# Patient Record
Sex: Female | Born: 1965
Health system: Southern US, Community
[De-identification: ages and names within clinical notes are randomized; demographics above are authoritative.]

## PROBLEM LIST (undated history)

## (undated) DIAGNOSIS — L0390 Cellulitis, unspecified: Secondary | ICD-10-CM

## (undated) DIAGNOSIS — J302 Other seasonal allergic rhinitis: Secondary | ICD-10-CM

## (undated) DIAGNOSIS — L309 Dermatitis, unspecified: Secondary | ICD-10-CM

## (undated) DIAGNOSIS — C519 Malignant neoplasm of vulva, unspecified: Secondary | ICD-10-CM

## (undated) DIAGNOSIS — R0600 Dyspnea, unspecified: Secondary | ICD-10-CM

## (undated) DIAGNOSIS — I10 Essential (primary) hypertension: Secondary | ICD-10-CM

## (undated) DIAGNOSIS — H04129 Dry eye syndrome of unspecified lacrimal gland: Secondary | ICD-10-CM

## (undated) DIAGNOSIS — Z973 Presence of spectacles and contact lenses: Secondary | ICD-10-CM

## (undated) DIAGNOSIS — E119 Type 2 diabetes mellitus without complications: Secondary | ICD-10-CM

## (undated) DIAGNOSIS — D071 Carcinoma in situ of vulva: Secondary | ICD-10-CM

## (undated) DIAGNOSIS — E669 Obesity, unspecified: Secondary | ICD-10-CM

## (undated) DIAGNOSIS — H3530 Unspecified macular degeneration: Secondary | ICD-10-CM

## (undated) HISTORY — DX: Obesity, unspecified: E66.9

## (undated) HISTORY — DX: Malignant neoplasm of vulva, unspecified: C51.9

## (undated) HISTORY — DX: Dry eye syndrome of unspecified lacrimal gland: H04.129

## (undated) HISTORY — PX: NO PAST SURGERIES: SHX2092

## (undated) HISTORY — DX: Essential (primary) hypertension: I10

---

## 2007-07-31 ENCOUNTER — Ambulatory Visit: Payer: Self-pay | Admitting: Family Medicine

## 2007-07-31 DIAGNOSIS — I10 Essential (primary) hypertension: Secondary | ICD-10-CM | POA: Insufficient documentation

## 2007-08-21 ENCOUNTER — Ambulatory Visit: Payer: Self-pay | Admitting: Family Medicine

## 2007-08-21 LAB — CONVERTED CEMR LAB
AST: 21 units/L (ref 0–37)
Albumin: 4 g/dL (ref 3.5–5.2)
BUN: 14 mg/dL (ref 6–23)
Calcium: 9.3 mg/dL (ref 8.4–10.5)
Chloride: 100 meq/L (ref 96–112)
Glucose, Bld: 304 mg/dL — ABNORMAL HIGH (ref 70–99)
HDL: 47 mg/dL (ref >39)
Potassium: 4.3 meq/L (ref 3.5–5.3)
TSH: 2.905 microintl units/mL (ref 0.350–5.50)
Triglycerides: 111 mg/dL (ref <150)

## 2007-08-23 ENCOUNTER — Ambulatory Visit: Payer: Self-pay | Admitting: Family Medicine

## 2007-08-23 DIAGNOSIS — E118 Type 2 diabetes mellitus with unspecified complications: Secondary | ICD-10-CM | POA: Insufficient documentation

## 2007-08-28 ENCOUNTER — Encounter: Payer: Self-pay | Admitting: Family Medicine

## 2007-08-30 ENCOUNTER — Ambulatory Visit: Payer: Self-pay | Admitting: Family Medicine

## 2007-08-30 LAB — CONVERTED CEMR LAB: Blood Glucose, Fasting: 198 mg/dL

## 2007-08-31 ENCOUNTER — Ambulatory Visit: Payer: Self-pay | Admitting: Family Medicine

## 2007-09-05 ENCOUNTER — Encounter: Payer: Self-pay | Admitting: Family Medicine

## 2007-09-11 ENCOUNTER — Ambulatory Visit: Payer: Self-pay | Admitting: Family Medicine

## 2007-09-13 ENCOUNTER — Encounter: Admission: RE | Admit: 2007-09-13 | Discharge: 2007-09-13 | Payer: Self-pay | Admitting: Family Medicine

## 2007-09-15 ENCOUNTER — Telehealth: Payer: Self-pay | Admitting: Family Medicine

## 2007-09-19 ENCOUNTER — Encounter: Payer: Self-pay | Admitting: Family Medicine

## 2007-09-27 ENCOUNTER — Ambulatory Visit: Payer: Self-pay | Admitting: Family Medicine

## 2007-10-02 ENCOUNTER — Encounter: Payer: Self-pay | Admitting: Family Medicine

## 2007-10-02 ENCOUNTER — Other Ambulatory Visit: Admission: RE | Admit: 2007-10-02 | Discharge: 2007-10-02 | Payer: Self-pay | Admitting: Family Medicine

## 2007-10-02 ENCOUNTER — Ambulatory Visit: Payer: Self-pay | Admitting: Family Medicine

## 2007-10-02 ENCOUNTER — Encounter: Admission: RE | Admit: 2007-10-02 | Discharge: 2007-10-02 | Payer: Self-pay | Admitting: Family Medicine

## 2007-10-05 ENCOUNTER — Encounter: Payer: Self-pay | Admitting: Family Medicine

## 2007-10-05 LAB — CONVERTED CEMR LAB: Pap Smear: NORMAL

## 2007-10-19 ENCOUNTER — Encounter: Payer: Self-pay | Admitting: Family Medicine

## 2007-10-19 DIAGNOSIS — E11319 Type 2 diabetes mellitus with unspecified diabetic retinopathy without macular edema: Secondary | ICD-10-CM | POA: Insufficient documentation

## 2007-11-30 ENCOUNTER — Ambulatory Visit: Payer: Self-pay | Admitting: Family Medicine

## 2007-11-30 LAB — CONVERTED CEMR LAB: Hgb A1c MFr Bld: 6.3 %

## 2007-12-12 ENCOUNTER — Telehealth: Payer: Self-pay | Admitting: Family Medicine

## 2008-02-29 ENCOUNTER — Ambulatory Visit: Payer: Self-pay | Admitting: Family Medicine

## 2008-03-11 ENCOUNTER — Encounter: Payer: Self-pay | Admitting: Family Medicine

## 2008-05-31 ENCOUNTER — Ambulatory Visit: Payer: Self-pay | Admitting: Family Medicine

## 2008-08-30 ENCOUNTER — Ambulatory Visit: Payer: Self-pay | Admitting: Family Medicine

## 2008-08-30 DIAGNOSIS — R609 Edema, unspecified: Secondary | ICD-10-CM | POA: Insufficient documentation

## 2008-08-30 LAB — CONVERTED CEMR LAB

## 2008-09-02 DIAGNOSIS — E119 Type 2 diabetes mellitus without complications: Secondary | ICD-10-CM | POA: Insufficient documentation

## 2008-09-04 LAB — CONVERTED CEMR LAB
Albumin: 4.1 g/dL (ref 3.5–5.2)
CO2: 25 meq/L (ref 19–32)
Calcium: 9.6 mg/dL (ref 8.4–10.5)
Chloride: 103 meq/L (ref 96–112)
Cholesterol: 193 mg/dL (ref 0–200)
Glucose, Bld: 134 mg/dL — ABNORMAL HIGH (ref 70–99)
Sodium: 141 meq/L (ref 135–145)
Total Bilirubin: 0.4 mg/dL (ref 0.3–1.2)
Total Protein: 8 g/dL (ref 6.0–8.3)
Triglycerides: 80 mg/dL (ref <150)
VLDL: 16 mg/dL (ref 0–40)

## 2008-09-24 ENCOUNTER — Encounter: Payer: Self-pay | Admitting: Family Medicine

## 2008-12-02 ENCOUNTER — Telehealth: Payer: Self-pay | Admitting: Family Medicine

## 2008-12-04 ENCOUNTER — Other Ambulatory Visit: Admission: RE | Admit: 2008-12-04 | Discharge: 2008-12-04 | Payer: Self-pay | Admitting: Family Medicine

## 2008-12-04 ENCOUNTER — Ambulatory Visit: Payer: Self-pay | Admitting: Family Medicine

## 2008-12-04 ENCOUNTER — Encounter: Payer: Self-pay | Admitting: Family Medicine

## 2009-03-06 ENCOUNTER — Ambulatory Visit: Payer: Self-pay | Admitting: Family Medicine

## 2009-03-06 LAB — CONVERTED CEMR LAB: Hgb A1c MFr Bld: 7.3 %

## 2009-04-16 ENCOUNTER — Ambulatory Visit: Payer: Self-pay | Admitting: Family Medicine

## 2009-04-16 DIAGNOSIS — J069 Acute upper respiratory infection, unspecified: Secondary | ICD-10-CM | POA: Insufficient documentation

## 2009-05-12 ENCOUNTER — Encounter: Payer: Self-pay | Admitting: Family Medicine

## 2009-07-15 ENCOUNTER — Ambulatory Visit: Payer: Self-pay | Admitting: Family Medicine

## 2009-07-15 LAB — CONVERTED CEMR LAB: Hgb A1c MFr Bld: 9.2 %

## 2009-11-14 ENCOUNTER — Ambulatory Visit: Payer: Self-pay | Admitting: Family Medicine

## 2009-11-14 LAB — CONVERTED CEMR LAB
Creatinine,U: 10 mg/dL
Microalbumin U total vol: 10 mg/L

## 2009-11-15 ENCOUNTER — Encounter: Payer: Self-pay | Admitting: Family Medicine

## 2009-11-18 LAB — CONVERTED CEMR LAB
AST: 17 units/L (ref 0–37)
Alkaline Phosphatase: 99 units/L (ref 39–117)
BUN: 16 mg/dL (ref 6–23)
Creatinine, Ser: 0.8 mg/dL (ref 0.40–1.20)
HDL: 42 mg/dL (ref >39)
LDL Cholesterol: 70 mg/dL (ref 0–99)
Total CHOL/HDL Ratio: 3.1
Triglycerides: 92 mg/dL (ref <150)

## 2009-12-15 ENCOUNTER — Ambulatory Visit: Payer: Self-pay | Admitting: Family Medicine

## 2010-01-16 ENCOUNTER — Ambulatory Visit: Payer: Self-pay | Admitting: Family Medicine

## 2010-02-20 ENCOUNTER — Ambulatory Visit: Payer: Self-pay | Admitting: Family Medicine

## 2010-06-16 NOTE — Assessment & Plan Note (Signed)
Summary: f/u wt/ sugars   Vital Signs:  Patient profile:   45 year old female Height:      64.75 inches Weight:      324 pounds BMI:     54.53 O2 Sat:      98 % on Room air Pulse rate:   97 / minute BP sitting:   132 / 74  (left arm)  Vitals Entered By: Payton Spark CMA (December 15, 2009 8:48 AM)  O2 Flow:  Room air  CC: F/U weight   Primary Care Provider:  Seymour Bars DO  CC:  F/U weight.  History of Present Illness: 45 yo WF presents for f/u weight mgmt and DM.  Her sugars have improved with changing her sulfonylurea to two times a day.  She is running 130s AM fasting.  Her 2 hr post dinner readings are running in the 150 range.  Denies any lows.  Tolerating it well.  She is trying to be more active.  She has been doing well on the Phentermine.  She has lost 5 lbs in the past month and has found that she is snacking less.    Denies heart palpitations, insomnia or tremor from the Phentermine.  Has not called [facility] about bariatric classes yet.    Allergies: No Known Drug Allergies  Past History:  Past Medical History: obesity infertility Type II DM 4-09 with retinopathy HTN  Social History: Reviewed history from 07/31/2007 and no changes required. Homemaker.  Married to [place]. Looking into Fostering kids/ adopting. Used to work in [employer].  Moved from [place] in 2008. Quit smoking in 1988. Rare ETOH. Max weight 340, trying to lose.  No exercise.    Review of Systems      See HPI  Physical Exam  General:  obese WF in NAD Lungs:  Normal respiratory effort, chest expands symmetrically. Lungs are clear to auscultation, no crackles or wheezes. Heart:  Normal rate and regular rhythm. S1 and S2 normal without gallop, murmur, click, rub or other extra sounds. Skin:  color normal.   Psych:  good eye contact, not anxious appearing, and not depressed appearing.    Diabetes Management Exam:    Foot Exam (with socks and/or shoes not present):       Sensory-Pinprick/Light  touch:          Left medial foot (L-4): normal          Left dorsal foot (L-5): normal          Left lateral foot (S-1): normal          Right medial foot (L-4): normal          Right dorsal foot (L-5): normal          Right lateral foot (S-1): normal       Sensory-Monofilament:          Left foot: normal          Right foot: normal       Inspection:          Left foot: normal          Right foot: normal       Nails:          Left foot: normal          Right foot: normal   Impression & Recommendations:  Problem # 1:  DIABETES MELLITUS (ICD-250.00) Assessment Improved Home sugar readings have improved with change from once daily Amaryl to twice daily Glipizide.  Continue current meds.  F/U DM in 2 months.  Monofilament is normal today. Her updated medication list for this problem includes:    Micardis Hct 80-12.5 Mg Tabs (Telmisartan-hctz) .Marland Kitchen... 1 tab by mouth daily    Metformin Hcl 1000 Mg Tabs (Metformin hcl) .Marland Kitchen... 1 tab by mouth bid    Bayer Childrens Aspirin 81 Mg Chew (Aspirin) .Marland Kitchen... 1 tab by mouth daily    Glipizide 5 Mg Tabs (Glipizide) .Marland Kitchen... 1 tab by mouth two times a day with breakfast and dinner  Problem # 2:  MORBID OBESITY (ICD-278.01) Wt lost: 5 # in 1 month on phentermine.  Improvement in snacking and portion control.  Will continue to work on diet, exercise and she has the numbner for [facility] bariatric surgery info classes but is not ready to call.    RTC in 1 month for wt/ BP check on month 2 of phentermine.    Complete Medication List: 1)  Micardis Hct 80-12.5 Mg Tabs (Telmisartan-hctz) .Marland Kitchen.. 1 tab by mouth daily 2)  Metformin Hcl 1000 Mg Tabs (Metformin hcl) .Marland Kitchen.. 1 tab by mouth bid 3)  Bayer Childrens Aspirin 81 Mg Chew (Aspirin) .Marland Kitchen.. 1 tab by mouth daily 4)  1st Choice Lancets Super Thin Misc (Lancets) .... Use two times a day as directed 5)  One Touch Ultra Strips 100's  .... Use as directed dx:250.00 6)  Metronidazole 0.75 % Lotn (Metronidazole) .... Apply to  roseacea bid 7)  Lipitor 20 Mg Tabs (Atorvastatin calcium) .Marland Kitchen.. 1 tab by mouth qhs 8)  Glipizide 5 Mg Tabs (Glipizide) .Marland Kitchen.. 1 tab by mouth two times a day with breakfast and dinner 9)  Phentermine Hcl 37.5 Mg Tabs (Phentermine hcl) .... 1/2 tab by mouth qam, 30 min before breakfast and 1/2 tab by mouth at 2:00 pm  Patient Instructions: 1)  Stay on Phentermine. 2)  Keep working on healthy diet with portion control and avoid snacking. 3)  Increase physical activity. 4)  Return for a nurse visit in 1 month - WT/ BP check. Prescriptions: PHENTERMINE HCL 37.5 MG TABS (PHENTERMINE HCL) 1/2 tab by mouth qAM, 30 min before breakfast and 1/2 tab by mouth at 2:00 pm  #30 x 0   Entered and Authorized by:   Seymour Bars DO   Signed by:   Seymour Bars DO on 12/15/2009   Method used:   Printed then faxed to ...       [facility]* (retail)       [address] [place], [place]  [address]       Ph: [phone]       Fax: [phone]   RxID:   [id number] METFORMIN HCL 1000 MG TABS (METFORMIN HCL) 1 tab by mouth bid  #30 x 0   Entered by:   Payton Spark CMA   Authorized by:   Seymour Bars DO   Signed by:   Payton Spark CMA on 12/15/2009   Method used:   Electronically to        [facility]* (retail)       [address] [place], [place]  [address]       Ph: [phone]       Fax: [phone]   RxID:   [id number]

## 2010-06-16 NOTE — Assessment & Plan Note (Signed)
Summary: f/u DM   Vital Signs:  Patient profile:   45 year old female Height:      64.75 inches Weight:      329 pounds BMI:     55.37 Pulse rate:   79 / minute BP sitting:   122 / 69  (left arm) Cuff size:   large  Vitals Entered By: Kathlene November (November 14, 2009 7:59 AM) CC: followup diabetes Is Patient Diabetic? Yes   Primary Care Provider:  Seymour Bars DO  CC:  followup diabetes.  History of Present Illness: 45 yo WF presents for f/u T2DM.  3 mos ago, her A1C was 9.2--> 8.3 today.  Last visit, we added Glimeperide to her Metformin.  Her AM fastings have been running close to 200.  Her 2 hr PPs are in the 150s on avg.  Her eye exam is UTD.  She has retinopathy.  She has gained 9 more lbs.  She has poor dietary adherence.  Not yet exercising.     Current Medications (verified): 1)  Micardis Hct 80-12.5 Mg  Tabs (Telmisartan-Hctz) .Marland Kitchen.. 1 Tab By Mouth Daily 2)  Metformin Hcl 1000 Mg Tabs (Metformin Hcl) .Marland Kitchen.. 1 Tab By Mouth Bid 3)  Bayer Childrens Aspirin 81 Mg  Chew (Aspirin) .Marland Kitchen.. 1 Tab By Mouth Daily 4)  1st Choice Lancets Super Thin   Misc (Lancets) .... Use Two Times A Day As Directed 5)  One Touch Ultra Strips 100's .... Use As Directed Dx:250.00 6)  Metronidazole 0.75 % Lotn (Metronidazole) .... Apply To Roseacea Bid 7)  Lipitor 20 Mg Tabs (Atorvastatin Calcium) .Marland Kitchen.. 1 Tab By Mouth Qhs  Allergies (verified): No Known Drug Allergies  Comments:  Nurse/Medical Assistant: The patient's medications and allergies were reviewed with the patient and were updated in the Medication and Allergy Lists. Kathlene November (November 14, 2009 8:00 AM)  Past History:  Past Medical History: Reviewed history from 08/30/2007 and no changes required. obesity infertility Type II DM 4-09 HTN  Family History: Reviewed history from 07/31/2007 and no changes required. father died of melanoma at 45 mother alive, DM sister DM, HTN 2 older brothers healthy  Social History: Reviewed history  from 07/31/2007 and no changes required. Homemaker.  Married to [place]. Looking into Fostering kids/ adopting. Used to work in [employer].  Moved from [place] in 2008. Quit smoking in 1988. Rare ETOH. Max weight 340, trying to lose.  No exercise.    Review of Systems      See HPI  Physical Exam  General:  morbidly obese WF in NAD Head:  normocephalic and atraumatic.   Eyes:  pupils equal, pupils round, and pupils reactive to light.  wears glasses Mouth:  pharynx pink and moist.   Neck:  no masses.   Lungs:  Normal respiratory effort, chest expands symmetrically. Lungs are clear to auscultation, no crackles or wheezes. Heart:  Normal rate and regular rhythm. S1 and S2 normal without gallop, murmur, click, rub or other extra sounds. Extremities:  no LE edema Skin:  color normal.   Psych:  good eye contact, not anxious appearing, and not depressed appearing.     Impression & Recommendations:  Problem # 1:  DIABETES MELLITUS (ICD-250.00) A1C 9.2--> 8.3, better but still not at goal with high AM fastings -- due mostly to poor diet and maybe from the glimeperide wearing off overnight.  Will change her to twice a day glipizide and keep metformin on board.  F/U for DM in 3 mos.  Umicro normal today.  Monofilament at next visit with flu shot.  Has complication of retinopathy. The following medications were removed from the medication list:    Glimepiride 2 Mg Tabs (Glimepiride) .Marland Kitchen... 1 tab by mouth once daily with breakfast Her updated medication list for this problem includes:    Micardis Hct 80-12.5 Mg Tabs (Telmisartan-hctz) .Marland Kitchen... 1 tab by mouth daily    Metformin Hcl 1000 Mg Tabs (Metformin hcl) .Marland Kitchen... 1 tab by mouth bid    Bayer Childrens Aspirin 81 Mg Chew (Aspirin) .Marland Kitchen... 1 tab by mouth daily    Glipizide 5 Mg Tabs (Glipizide) .Marland Kitchen... 1 tab by mouth two times a day with breakfast and dinner  Orders: Fingerstick ([address]) Creatinine  ([address]) Hemoglobin A1C ([address]) Urine Microalbumin  (9[address]78) T-Comprehensive Metabolic Panel ([address]-13086)  Labs Reviewed: Creat: 0.71 (08/30/2008)   Microalbumin: 10 (11/14/2009)  Last Eye Exam: done by Dr Clearance Coots.  + for retinopathy (10/04/2007) Reviewed HgBA1c results: 8.3 (11/14/2009)  9.2 (07/15/2009)  Problem # 2:  MORBID OBESITY (ICD-278.01) BMI 55 c/w class III + obesity, gained another 9 lbs. Poor dietary compliance and little exercise.  She has a hard time controlling her snacking mostly.  Will try her on phentermine for the next month to see if this helps w/ wt reduction.  Her elevated sugars are a direct reflection of her poor diet and lack of exercise.  I've also given her the # to attend the bariatric surgery info class at [facility].  Problem # 3:  DYSLIPIDEMIA (ICD-272.4) Update labs.  Will see if Lipitor has her LDL at <100. Her updated medication list for this problem includes:    Lipitor 20 Mg Tabs (Atorvastatin calcium) .Marland Kitchen... 1 tab by mouth qhs  Orders: T-Lipid Profile [phone])  Labs Reviewed: SGOT: 14 (08/30/2008)   SGPT: 17 (08/30/2008)  Prior 10 Yr Risk Heart Disease: 6 % (05/31/2008)   HDL:47 (08/30/2008), 47 (08/21/2007)  LDL:130 (08/30/2008), 153 (08/21/2007)  Chol:193 (08/30/2008), 222 (08/21/2007)  Trig:80 (08/30/2008), 111 (08/21/2007)  Problem # 4:  ESSENTIAL HYPERTENSION, BENIGN (ICD-401.1) BP at goal and labs are due today. Her updated medication list for this problem includes:    Micardis Hct 80-12.5 Mg Tabs (Telmisartan-hctz) .Marland Kitchen... 1 tab by mouth daily  BP today: 122/69 Prior BP: 128/73 (07/15/2009)  Prior 10 Yr Risk Heart Disease: 6 % (05/31/2008)  Labs Reviewed: K+: 4.2 (08/30/2008) Creat: : 0.71 (08/30/2008)   Chol: 193 (08/30/2008)   HDL: 47 (08/30/2008)   LDL: 130 (08/30/2008)   TG: 80 (08/30/2008)  Complete Medication List: 1)  Micardis Hct 80-12.5 Mg Tabs (Telmisartan-hctz) .Marland Kitchen.. 1 tab by mouth daily 2)  Metformin Hcl 1000 Mg Tabs (Metformin hcl) .Marland Kitchen.. 1 tab by mouth bid 3)  Bayer  Childrens Aspirin 81 Mg Chew (Aspirin) .Marland Kitchen.. 1 tab by mouth daily 4)  1st Choice Lancets Super Thin Misc (Lancets) .... Use two times a day as directed 5)  One Touch Ultra Strips 100's  .... Use as directed dx:250.00 6)  Metronidazole 0.75 % Lotn (Metronidazole) .... Apply to roseacea bid 7)  Lipitor 20 Mg Tabs (Atorvastatin calcium) .Marland Kitchen.. 1 tab by mouth qhs 8)  Glipizide 5 Mg Tabs (Glipizide) .Marland Kitchen.. 1 tab by mouth two times a day with breakfast and dinner 9)  Phentermine Hcl 37.5 Mg Tabs (Phentermine hcl) .... 1/2 tab by mouth qam, 30 min before breakfast and 1/2 tab by mouth at 2:00 pm  Patient Instructions: 1)  Update fasting labs today. 2)  Will call you w/ results on Tuesday.  3)  Change glimeperide to glipizide -- take with breakfast and dinner. 4)  Start Phentermine (appetite suppressant ) 1/2 tab in the AM, 30 min before breakfast and 1/2 tab between 2-3 pm. 5)  Return for f/u in 1 month. Prescriptions: PHENTERMINE HCL 37.5 MG TABS (PHENTERMINE HCL) 1/2 tab by mouth qAM, 30 min before breakfast and 1/2 tab by mouth at 2:00 pm  #30 x 0   Entered and Authorized by:   Seymour Bars DO   Signed by:   Seymour Bars DO on 11/14/2009   Method used:   Print then Give to Patient   RxID:   [id number] GLIPIZIDE 5 MG TABS (GLIPIZIDE) 1 tab by mouth two times a day with breakfast and dinner  #60 x 3   Entered and Authorized by:   Seymour Bars DO   Signed by:   Seymour Bars DO on 11/14/2009   Method used:   Electronically to        [facility]* (retail)       [address] [place], [place]  [address]       Ph: [phone]       Fax: [phone]   RxID:   [id number]   Laboratory Results   Urine Tests  Date/Time Received: 11/14/2009 Date/Time Reported: 11/14/2009  Microalbumin (urine): 10 mg/L Creatinine: 10mg /dL  A:C Ratio 30mg /g  Blood Tests   Date/Time Received: 11/14/2009 Date/Time Reported: 11/14/2009  HGBA1C: 8.3%   (Normal Range: Non-Diabetic - 3-6%   Control  Diabetic - 6-8%)

## 2010-06-16 NOTE — Letter (Signed)
Summary: [facility]   Imported By: Lanelle Bal 05/22/2009 11:41:23  _____________________________________________________________________  External Attachment:    Type:   Image     Comment:   External Document

## 2010-06-16 NOTE — Assessment & Plan Note (Signed)
Summary: WT/ BP check  Nurse Visit   Vital Signs:  Patient profile:   45 year old female Height:      64.75 inches Weight:      318 pounds BMI:     53.52 O2 Sat:      96 % on Room air Pulse rate:   88 / minute BP sitting:   121 / 71  (left arm) Cuff size:   large  Vitals Entered By: Payton Spark CMA (January 16, 2010 4:14 PM)  O2 Flow:  Room air  Impression & Recommendations:  Problem # 1:  MORBID OBESITY (ICD-278.01) 6 lbs lost in 1 month from new start Phentermine.  BP at goal.  Continue Phentermine and f/u with me in 1 month.  Complete Medication List: 1)  Micardis Hct 80-12.5 Mg Tabs (Telmisartan-hctz) .Marland Kitchen.. 1 tab by mouth daily 2)  Metformin Hcl 1000 Mg Tabs (Metformin hcl) .Marland Kitchen.. 1 tab by mouth bid 3)  Bayer Childrens Aspirin 81 Mg Chew (Aspirin) .Marland Kitchen.. 1 tab by mouth daily 4)  1st Choice Lancets Super Thin Misc (Lancets) .... Use two times a day as directed 5)  One Touch Ultra Strips 100's  .... Use as directed dx:250.00 6)  Metronidazole 0.75 % Lotn (Metronidazole) .... Apply to roseacea bid 7)  Lipitor 20 Mg Tabs (Atorvastatin calcium) .Marland Kitchen.. 1 tab by mouth qhs 8)  Glipizide 5 Mg Tabs (Glipizide) .Marland Kitchen.. 1 tab by mouth two times a day with breakfast and dinner 9)  Phentermine Hcl 37.5 Mg Tabs (Phentermine hcl) .... 1/2 tab by mouth qam, 30 min before breakfast and 1/2 tab by mouth at 2:00 pm  CC: F/U weight and BP   Allergies: No Known Drug Allergies  Orders Added: 1)  Est. Patient Level I [[address]]  Appended Document: WT/ BP check Prescriptions: PHENTERMINE HCL 37.5 MG TABS (PHENTERMINE HCL) 1/2 tab by mouth qAM, 30 min before breakfast and 1/2 tab by mouth at 2:00 pm  #30 x 0   Entered and Authorized by:   Seymour Bars DO   Signed by:   Seymour Bars DO on 01/16/2010   Method used:   Printed then faxed to ...       [facility]* (retail)       [address] [place], [place]  [address]       Ph: [phone]       Fax: [phone]   RxID:    [id number]

## 2010-06-16 NOTE — Assessment & Plan Note (Signed)
Summary: f/u diabetes   Vital Signs:  Patient profile:   45 year old female Height:      64.75 inches Weight:      320 pounds BMI:     53.86 O2 Sat:      98 % on Room air Pulse rate:   83 / minute BP sitting:   128 / 73  (left arm) Cuff size:   large  Vitals Entered By: Payton Spark CMA (July 15, 2009 8:10 AM)  O2 Flow:  Room air CC: F/U DM   Primary Care Provider:  Seymour Bars DO  CC:  F/U DM.  History of Present Illness: 45 yo WF presents for f/u T2DM.  Her A1C rose from 7.3 in October and is 9.2 now.  She is compliant with her metformin 1 gram two times a day.  Fair to poor dietary compliance.  AM fastings have been 110-140.  She only checks them once a wk.  Denies CP or DOE.  No sore.  No tingling in hands or feet.  Denies any blurry vision.  Had a cold for a month this winter but is feeling much better.  Her labs, diabetic eye exam, urine micro and monofilament are UTD.    Current Medications (verified): 1)  Micardis Hct 80-12.5 Mg  Tabs (Telmisartan-Hctz) .Marland Kitchen.. 1 Tab By Mouth Daily 2)  Metformin Hcl 1000 Mg Tabs (Metformin Hcl) .Marland Kitchen.. 1 Tab By Mouth Bid 3)  Bayer Childrens Aspirin 81 Mg  Chew (Aspirin) .Marland Kitchen.. 1 Tab By Mouth Daily 4)  Glucometer of Choice With Test Strips .... Dx: 250.00 (New Diagnosis) 5)  1st Choice Lancets Super Thin   Misc (Lancets) .... Use Two Times A Day As Directed 6)  One Touch Ultra Strips 100's .... Use As Directed Dx:250.00 7)  Metronidazole 0.75 % Lotn (Metronidazole) .... Apply To Roseacea Bid 8)  Lipitor 20 Mg Tabs (Atorvastatin Calcium) .Marland Kitchen.. 1 Tab By Mouth Qhs  Allergies (verified): No Known Drug Allergies  Past History:  Past Medical History: Reviewed history from 08/30/2007 and no changes required. obesity infertility Type II DM 4-09 HTN  Family History: Reviewed history from 07/31/2007 and no changes required. father died of melanoma at 51 mother alive, DM sister DM, HTN 2 older brothers healthy  Social  History: Reviewed history from 07/31/2007 and no changes required. Homemaker.  Married to [place]. Looking into Fostering kids/ adopting. Used to work in [employer].  Moved from [place] in 2008. Quit smoking in 1988. Rare ETOH. Max weight 340, trying to lose.  No exercise.    Review of Systems      See HPI  Physical Exam  General:  alert, well-developed, well-nourished, and well-hydrated.  morbidly obese Head:  normocephalic and atraumatic.   Eyes:  pupils equal, pupils round, and pupils reactive to light.  wears glasses Mouth:  pharynx pink and moist.   Neck:  no masses.   Lungs:  Normal respiratory effort, chest expands symmetrically. Lungs are clear to auscultation, no crackles or wheezes. Heart:  Normal rate and regular rhythm. S1 and S2 normal without gallop, murmur, click, rub or other extra sounds. Pulses:  2+ radial and pedal pulses Extremities:  no LE edema Skin:  color normal.  no skin breakdown on feet Cervical Nodes:  No lymphadenopathy noted Psych:  good eye contact, not anxious appearing, and not depressed appearing.     Impression & Recommendations:  Problem # 1:  DIABETES MELLITUS (ICD-250.00) Complicated by retinopathy.  A1C has risen from 7.3-->  9.2.  Start Glimeperide 2 mg daily with BF to metformin.  Wt unchanged still w/ a BMI of 53 in the class III+ range.  RTC in 3 mos.  Glucose goals given.  Work on Psychologist, sport and exercise wt loss.  Consider bariatric surgery, byetta as options. Her updated medication list for this problem includes:    Micardis Hct 80-12.5 Mg Tabs (Telmisartan-hctz) .Marland Kitchen... 1 tab by mouth daily    Metformin Hcl 1000 Mg Tabs (Metformin hcl) .Marland Kitchen... 1 tab by mouth bid    Bayer Childrens Aspirin 81 Mg Chew (Aspirin) .Marland Kitchen... 1 tab by mouth daily    Glimepiride 2 Mg Tabs (Glimepiride) .Marland Kitchen... 1 tab by mouth once daily with breakfast  Orders: Fingerstick ([address]) Hemoglobin A1C (76[address])  Problem # 2:  DYSLIPIDEMIA (ICD-272.4)  Her updated medication list for this  problem includes:    Lipitor 20 Mg Tabs (Atorvastatin calcium) .Marland Kitchen... 1 tab by mouth qhs  Labs Reviewed: SGOT: 14 (08/30/2008)   SGPT: 17 (08/30/2008)  Prior 10 Yr Risk Heart Disease: 6 % (05/31/2008)   HDL:47 (08/30/2008), 47 (08/21/2007)  LDL:130 (08/30/2008), 153 (08/21/2007)  Chol:193 (08/30/2008), 222 (08/21/2007)  Trig:80 (08/30/2008), 111 (08/21/2007)  Problem # 3:  ESSENTIAL HYPERTENSION, BENIGN (ICD-401.1) Assessment: Improved  Her updated medication list for this problem includes:    Micardis Hct 80-12.5 Mg Tabs (Telmisartan-hctz) .Marland Kitchen... 1 tab by mouth daily  BP today: 128/73 Prior BP: 153/85 (04/16/2009)  Prior 10 Yr Risk Heart Disease: 6 % (05/31/2008)  Labs Reviewed: K+: 4.2 (08/30/2008) Creat: : 0.71 (08/30/2008)   Chol: 193 (08/30/2008)   HDL: 47 (08/30/2008)   LDL: 130 (08/30/2008)   TG: 80 (08/30/2008)  Complete Medication List: 1)  Micardis Hct 80-12.5 Mg Tabs (Telmisartan-hctz) .Marland Kitchen.. 1 tab by mouth daily 2)  Metformin Hcl 1000 Mg Tabs (Metformin hcl) .Marland Kitchen.. 1 tab by mouth bid 3)  Bayer Childrens Aspirin 81 Mg Chew (Aspirin) .Marland Kitchen.. 1 tab by mouth daily 4)  1st Choice Lancets Super Thin Misc (Lancets) .... Use two times a day as directed 5)  One Touch Ultra Strips 100's  .... Use as directed dx:250.00 6)  Metronidazole 0.75 % Lotn (Metronidazole) .... Apply to roseacea bid 7)  Lipitor 20 Mg Tabs (Atorvastatin calcium) .Marland Kitchen.. 1 tab by mouth qhs 8)  Glimepiride 2 Mg Tabs (Glimepiride) .Marland Kitchen.. 1 tab by mouth once daily with breakfast  Patient Instructions: 1)  Add Glimeperide 2 mg once daily with your breakfast. 2)  Stay on Metformin. 3)  Work on improving diabetic diet compliance and add exercise 30+ min most days of the wk to help w/ wt loss. 4)  Check sugars AM fasting.  goal is 90-120. 5)  2 hrs after dinner sugar goal is <150. 6)  BP looks perfect! 7)  Return for f/u diabetes with log book in 3 mos. Prescriptions: GLIMEPIRIDE 2 MG TABS (GLIMEPIRIDE) 1 tab by mouth  once daily with breakfast  #30 x 3   Entered and Authorized by:   Seymour Bars DO   Signed by:   Seymour Bars DO on 07/15/2009   Method used:   Electronically to        [facility]* (retail)       [address] [place], [place]  [address]       Ph: [phone]       Fax: [phone]   RxID:   [id number]   Laboratory Results   Blood Tests     HGBA1C: 9.2%   (  Normal Range: Non-Diabetic - 3-6%   Control Diabetic - 6-8%)

## 2010-06-16 NOTE — Assessment & Plan Note (Signed)
Summary: NURSE  Nurse Visit   Vital Signs:  Patient profile:   45 year old female Weight:      315 pounds Pulse rate:   73 / minute BP sitting:   101 / 62  (right arm) Cuff size:   large  Impression & Recommendations:  Problem # 1:  MORBID OBESITY (ICD-278.01) Only 3 # lost in 1 month. Schedule OV for f/u.  Will not RF her Phentermine. Orders: Est. Patient Level I ([address])  Complete Medication List: 1)  Micardis Hct 80-12.5 Mg Tabs (Telmisartan-hctz) .Marland Kitchen.. 1 tab by mouth daily 2)  Metformin Hcl 1000 Mg Tabs (Metformin hcl) .Marland Kitchen.. 1 tab by mouth bid 3)  Bayer Childrens Aspirin 81 Mg Chew (Aspirin) .Marland Kitchen.. 1 tab by mouth daily 4)  1st Choice Lancets Super Thin Misc (Lancets) .... Use two times a day as directed 5)  One Touch Ultra Strips 100's  .... Use as directed dx:250.00 6)  Metronidazole 0.75 % Lotn (Metronidazole) .... Apply to roseacea bid 7)  Lipitor 20 Mg Tabs (Atorvastatin calcium) .Marland Kitchen.. 1 tab by mouth qhs 8)  Glipizide 5 Mg Tabs (Glipizide) .Marland Kitchen.. 1 tab by mouth two times a day with breakfast and dinner 9)  Phentermine Hcl 37.5 Mg Tabs (Phentermine hcl) .... 1/2 tab by mouth qam, 30 min before breakfast and 1/2 tab by mouth at 2:00 pm   Primary Care Amiria Orrison:  Seymour Bars DO   History of Present Illness: f/u Wt and BP   Allergies: No Known Drug Allergies  Orders Added: 1)  Est. Patient Level I [[address]]

## 2010-08-16 ENCOUNTER — Other Ambulatory Visit: Payer: Self-pay | Admitting: Family Medicine

## 2010-10-05 ENCOUNTER — Other Ambulatory Visit: Payer: Self-pay | Admitting: Family Medicine

## 2010-10-21 ENCOUNTER — Other Ambulatory Visit: Payer: Self-pay | Admitting: *Deleted

## 2010-10-21 MED ORDER — METFORMIN HCL 1000 MG PO TABS: 1000.0000 mg | ORAL_TABLET | Freq: Two times a day (BID) | ORAL | Status: DC

## 2010-10-22 ENCOUNTER — Other Ambulatory Visit: Payer: Self-pay | Admitting: Family Medicine

## 2010-10-22 DIAGNOSIS — I10 Essential (primary) hypertension: Secondary | ICD-10-CM

## 2010-10-22 MED ORDER — TELMISARTAN-HCTZ 80-12.5 MG PO TABS: 1.0000 | ORAL_TABLET | Freq: Every day | ORAL | Status: DC

## 2010-10-22 NOTE — Telephone Encounter (Signed)
Pt  Called and needs a refill of med never filled by requesting pharm before.  Needs Micardis sent to [facility].  Pt needs an office visit for her BP  Last OV 12-15-09.  #30 pills given to [facility].  Needs to schedule an office visit within 30 days. Jarvis Newcomer, LPN [facility]

## 2010-11-16 ENCOUNTER — Other Ambulatory Visit: Payer: Self-pay | Admitting: *Deleted

## 2010-11-16 MED ORDER — GLIPIZIDE 5 MG PO TABS: 5.0000 mg | ORAL_TABLET | Freq: Two times a day (BID) | ORAL | Status: DC

## 2010-11-21 ENCOUNTER — Encounter: Payer: Self-pay | Admitting: Family Medicine

## 2010-11-22 ENCOUNTER — Other Ambulatory Visit: Payer: Self-pay | Admitting: Family Medicine

## 2010-11-23 ENCOUNTER — Encounter: Payer: Self-pay | Admitting: Family Medicine

## 2010-11-23 ENCOUNTER — Ambulatory Visit (INDEPENDENT_AMBULATORY_CARE_PROVIDER_SITE_OTHER): Payer: BC Managed Care – PPO | Admitting: Family Medicine

## 2010-11-23 VITALS — BP 154/83 | HR 100 | Ht 64.0 in | Wt 326.0 lb

## 2010-11-23 DIAGNOSIS — E119 Type 2 diabetes mellitus without complications: Secondary | ICD-10-CM

## 2010-11-23 DIAGNOSIS — I10 Essential (primary) hypertension: Secondary | ICD-10-CM

## 2010-11-23 DIAGNOSIS — R635 Abnormal weight gain: Secondary | ICD-10-CM

## 2010-11-23 DIAGNOSIS — E785 Hyperlipidemia, unspecified: Secondary | ICD-10-CM

## 2010-11-23 MED ORDER — AMBULATORY NON FORMULARY MEDICATION: Status: DC

## 2010-11-23 MED ORDER — ATORVASTATIN CALCIUM 20 MG PO TABS: 20.0000 mg | ORAL_TABLET | Freq: Every day | ORAL | Status: DC

## 2010-11-23 MED ORDER — TELMISARTAN-HCTZ 80-12.5 MG PO TABS: 1.0000 | ORAL_TABLET | Freq: Every day | ORAL | Status: DC

## 2010-11-23 MED ORDER — GLIPIZIDE 5 MG PO TABS: 5.0000 mg | ORAL_TABLET | Freq: Two times a day (BID) | ORAL | Status: DC

## 2010-11-23 MED ORDER — METFORMIN HCL 1000 MG PO TABS: 1000.0000 mg | ORAL_TABLET | Freq: Two times a day (BID) | ORAL | Status: DC

## 2010-11-23 MED ORDER — AF LANCETS SUPER THIN MISC: Status: DC

## 2010-11-23 MED ORDER — METRONIDAZOLE 0.75 % EX LOTN: TOPICAL_LOTION | CUTANEOUS | Status: DC

## 2010-11-23 NOTE — Assessment & Plan Note (Signed)
Update labs.  Order printed.

## 2010-11-23 NOTE — Patient Instructions (Signed)
A1C 7.8.- fair. BP high today.  Meds RFd. Work on low sugar/ low carb diet, wt loss, regular physical activity.  Return for f/u in 3 mos.

## 2010-11-23 NOTE — Progress Notes (Signed)
Addended by: Ellsworth Lennox on: 11/23/2010 09:17 AM   Modules accepted: Orders

## 2010-11-23 NOTE — Assessment & Plan Note (Addendum)
DM complicated by retinopathy.  She is due to update her eye exam.  Fair control by A1C at 7.8.  Stay on current meds.  Definitely needs to work on diet, exercise and weight loss given her BMI of 55.  Lab order printed.  meds RFd.

## 2010-11-23 NOTE — Assessment & Plan Note (Signed)
BP high today but she's been running normal at home.  She has not taken her RX BP meds in 24 hrs.  Meds RFd.  Labs ordered.

## 2010-11-23 NOTE — Progress Notes (Signed)
  Subjective:    Patient ID: Laurie Hoffman, female    DOB: January 25, 1966, 45 y.o.   MRN: [id number]  HPI  45 yo WF presents for f/u visit.  She is due for f/u diabetes and HTN.  We are due for labs and A1C today.  She has failed to lose any weight.  She is due to RF her meds.  She has complictaion of diabetic retinopathy and is due to go for her annual eye exam.  Denies any low sugars.  BP 154/83  Pulse 100  Ht 5\' 4"  (1.626 m)  Wt 326 lb (147.873 kg)  BMI 55.96 kg/m2  SpO2 94%   Review of Systems  Constitutional: Negative for fatigue and unexpected weight change.  Eyes: Negative for visual disturbance.  Respiratory: Negative for shortness of breath.   Cardiovascular: Negative for chest pain, palpitations and leg swelling.  Genitourinary: Negative for frequency.  Neurological: Negative for headaches.       Objective:   Physical Exam  Constitutional: She appears well-developed and well-nourished.  HENT:  Mouth/Throat: Oropharynx is clear and moist.  Neck: Neck supple. No thyromegaly present.  Cardiovascular: Normal rate, regular rhythm and normal heart sounds.   Pulmonary/Chest: Effort normal and breath sounds normal.  Musculoskeletal: She exhibits edema (1+ bilat pedal edema).  Skin: Skin is warm and dry.  Psychiatric: She has a normal mood and affect.          Assessment & Plan:

## 2010-12-15 ENCOUNTER — Other Ambulatory Visit: Payer: Self-pay | Admitting: Family Medicine

## 2010-12-22 ENCOUNTER — Other Ambulatory Visit: Payer: Self-pay | Admitting: *Deleted

## 2010-12-22 MED ORDER — GLIPIZIDE 5 MG PO TABS: 5.0000 mg | ORAL_TABLET | Freq: Two times a day (BID) | ORAL | Status: DC

## 2011-01-27 ENCOUNTER — Encounter: Payer: Self-pay | Admitting: Emergency Medicine

## 2011-01-27 ENCOUNTER — Inpatient Hospital Stay (INDEPENDENT_AMBULATORY_CARE_PROVIDER_SITE_OTHER)
Admission: RE | Admit: 2011-01-27 | Discharge: 2011-01-27 | Disposition: A | Discharge status: Home / Self Care | Payer: BC Managed Care – PPO | Source: Ambulatory Visit | Attending: Emergency Medicine | Admitting: Emergency Medicine

## 2011-01-27 DIAGNOSIS — L02419 Cutaneous abscess of limb, unspecified: Secondary | ICD-10-CM

## 2011-01-27 DIAGNOSIS — L03119 Cellulitis of unspecified part of limb: Secondary | ICD-10-CM

## 2011-02-23 ENCOUNTER — Ambulatory Visit: Payer: BC Managed Care – PPO | Admitting: Family Medicine

## 2011-02-23 DIAGNOSIS — Z0289 Encounter for other administrative examinations: Secondary | ICD-10-CM

## 2011-04-13 ENCOUNTER — Emergency Department
Admission: EM | Admit: 2011-04-13 | Discharge: 2011-04-13 | Disposition: A | Discharge status: Home / Self Care | Payer: BC Managed Care – PPO | Source: Home / Self Care | Attending: Emergency Medicine | Admitting: Emergency Medicine

## 2011-04-13 ENCOUNTER — Encounter: Payer: Self-pay | Admitting: *Deleted

## 2011-04-13 DIAGNOSIS — J069 Acute upper respiratory infection, unspecified: Secondary | ICD-10-CM

## 2011-04-13 DIAGNOSIS — R05 Cough: Secondary | ICD-10-CM

## 2011-04-13 DIAGNOSIS — R059 Cough, unspecified: Secondary | ICD-10-CM

## 2011-04-13 LAB — POCT RAPID STREP A (OFFICE): Rapid Strep A Screen: NEGATIVE

## 2011-04-13 MED ORDER — GUAIFENESIN-CODEINE 100-10 MG/5ML PO SYRP: 5.0000 mL | ORAL_SOLUTION | Freq: Four times a day (QID) | ORAL | Status: AC | PRN

## 2011-04-13 NOTE — ED Provider Notes (Signed)
History     CSN: [id number] Arrival date & time: 04/13/2011  9:31 AM   First MD Initiated Contact with Patient 04/13/11 [phone]      Chief Complaint  Patient presents with  . Sore Throat  . Cough    (Consider location/radiation/quality/duration/timing/severity/associated sxs/prior treatment) HPI Laurie Hoffman is a 45 y.o. female who complains of onset of cold symptoms for 2 days.  + sore throat + cough No pleuritic pain ? wheezing +nasal congestion + post-nasal drainage + sinus pain/pressure No chest congestion No itchy/red eyes No earache No hemoptysis No SOB No chills/sweats No fever No nausea No vomiting No abdominal pain No diarrhea No skin rashes + fatigue No myalgias No headache    Past Medical History  Diagnosis Date  . Obesity   . Infertility, female   . Diabetes mellitus 04-09    type 2/ with retinopathy  . Hypertension     History reviewed. No pertinent past surgical history.  Family History  Problem Relation Age of Onset  . Diabetes Mother   . Cancer Father 105    melanoma  . Diabetes Sister   . Hypertension Sister     History  Substance Use Topics  . Smoking status: Former Smoker    Types: Cigarettes    Quit date: 05/17/1986  . Smokeless tobacco: Not on file  . Alcohol Use: Yes     Rare    OB History    Grav Para Term Preterm Abortions TAB SAB Ect Mult Living                  Review of Systems  Allergies  Review of patient's allergies indicates no known allergies.  Home Medications   Current Outpatient Rx  Name Route Sig Dispense Refill  . AF LANCETS SUPER THIN MISC  Use twice daily as directed 200 each 3  . AMBULATORY NON FORMULARY MEDICATION  Medication Name:  One touch ultra test strips 120 strip 3  . ASPIRIN 81 MG PO CHEW Oral Chew 81 mg by mouth daily.      . ATORVASTATIN CALCIUM 20 MG PO TABS Oral Take 1 tablet (20 mg total) by mouth daily. 90 tablet 1  . GLIPIZIDE 5 MG PO TABS Oral Take 1 tablet (5 mg total) by mouth 2  (two) times daily before a meal. 60 tablet 2    Pt needs an appt  . GLUCOSE BLOOD VI STRP  1 each as needed. Use as instructed     . METFORMIN HCL 1000 MG PO TABS Oral Take 1 tablet (1,000 mg total) by mouth 2 (two) times daily with a meal. 180 tablet 1  . METFORMIN HCL 1000 MG PO TABS  TAKE 1 TABLET BY MOUTH TWICE DAILY WITH A MEAL 180 tablet 0  . METRONIDAZOLE 0.75 % EX LOTN  Use bid as directed 3 Bottle 1  . MICARDIS HCT 80-12.5 MG PO TABS  TAKE 1 TABLET BY MOUTH EVERY DAY 30 tablet 0  . TELMISARTAN-HCTZ 80-12.5 MG PO TABS Oral Take 1 tablet by mouth daily. 90 tablet 1    BP 164/90  Pulse 103  Temp(Src) 98.3 F (36.8 C) (Oral)  Resp 20  Ht 5\' 4"  (1.626 m)  Wt 326 lb 8 oz (148.099 kg)  BMI 56.04 kg/m2  SpO2 94%  Physical Exam  Nursing note and vitals reviewed. Constitutional: She is oriented to person, place, and time. She appears well-developed and well-nourished.  HENT:  Head: Normocephalic and atraumatic.  Right Ear: Tympanic membrane,  external ear and ear canal normal.  Left Ear: Tympanic membrane, external ear and ear canal normal.  Nose: Mucosal edema and rhinorrhea present.  Mouth/Throat: Posterior oropharyngeal erythema present. No oropharyngeal exudate or posterior oropharyngeal edema.  Neck: Neck supple.  Cardiovascular: Regular rhythm and normal heart sounds.   Pulmonary/Chest: Effort normal and breath sounds normal. No respiratory distress.  Neurological: She is alert and oriented to person, place, and time.  Skin: Skin is warm and dry.  Psychiatric: She has a normal mood and affect. Her speech is normal.    ED Course  Procedures (including critical care time)  Labs Reviewed - No data to display No results found.   No diagnosis found.    MDM  1)  rapid strep test today was negative. A throat culture is pending. No antibiotic was prescribed since this is likely viral. However I will give her a prescription for cough medicine. 2)  Use nasal saline  solution (over the counter) at least 3 times a day. 3)  Use over the counter decongestants like Zyrtec-D every 12 hours as needed to help with congestion.  If you have hypertension, do not take medicines with sudafed.  4)  Can take tylenol every 6 hours or motrin every 8 hours for pain or fever. 5)  Follow up with your primary doctor if no improvement in 5-7 days, sooner if increasing pain, fever, or new symptoms.     Lily Kocher, MD 04/13/11 [phone]

## 2011-04-13 NOTE — ED Notes (Signed)
Pt c/o sore throat, productive cough, and nasal congestion x 2 days. She has taken Theraflu.

## 2011-04-14 LAB — STREP A DNA PROBE: GASP: NEGATIVE

## 2011-04-19 NOTE — Progress Notes (Signed)
Summary: Right bruise on shin rm 5   Vital Signs:  Patient Profile:   45 Years Old Female CC:      RT lower leg injury x 1 1/2 wk ago Height:     64.75 inches Weight:      326 pounds O2 Sat:      96 % O2 treatment:    Room Air Temp:     98.3 degrees F oral Pulse rate:   105 / minute Resp:     16 per minute BP sitting:   165 / 98  (left arm) Cuff size:   regular cuff lower arm  Vitals Entered By: Clemens Catholic LPN (January 27, 2011 12:16 PM)                  Updated Prior Medication List: MICARDIS HCT 80-12.5 MG  TABS (TELMISARTAN-HCTZ) 1 tab by mouth daily METFORMIN HCL 1000 MG TABS (METFORMIN HCL) 1 tab by mouth bid [employer] ASPIRIN 81 MG  CHEW (ASPIRIN) 1 tab by mouth daily 1ST CHOICE LANCETS SUPER THIN   MISC (LANCETS) use two times a day as directed * ONE TOUCH ULTRA STRIPS 100'S USE AS DIRECTED DX:250.00 LIPITOR 20 MG TABS (ATORVASTATIN CALCIUM) 1 tab by mouth qhs GLIPIZIDE 5 MG TABS (GLIPIZIDE) 1 tab by mouth two times a day with breakfast and dinner  Current Allergies (reviewed today): No known allergies History of Present Illness History from: patient Chief Complaint: RT lower leg injury x 1 1/2 wk ago History of Present Illness: She bumped her RLE about 10 days ago.  She has bruising which started to resolve.  Then in part of the leg it became a little swollen, but mostly red on the front and feels warm.  She is able to walk and doesn't have much pain.  She is diabetic.  No other URI symptoms.  REVIEW OF SYSTEMS Constitutional Symptoms      Denies fever, chills, night sweats, weight loss, weight gain, and fatigue.  Eyes       Complains of glasses.      Denies change in vision, eye pain, eye discharge, contact lenses, and eye surgery. Ear/Nose/Throat/Mouth       Denies hearing loss/aids, change in hearing, ear pain, ear discharge, dizziness, frequent runny nose, frequent nose bleeds, sinus problems, sore throat, hoarseness, and tooth pain or bleeding.   Respiratory       Denies dry cough, productive cough, wheezing, shortness of breath, asthma, bronchitis, and emphysema/COPD.  Cardiovascular       Denies murmurs, chest pain, and tires easily with exhertion.    Gastrointestinal       Denies stomach pain, nausea/vomiting, diarrhea, constipation, blood in bowel movements, and indigestion.      Comments: nausea Genitourniary       Denies painful urination, kidney stones, and loss of urinary control. Neurological       Complains of numbness and tingling.      Denies paralysis, seizures, and fainting/blackouts. Musculoskeletal       Complains of muscle pain, joint pain, redness, and swelling.      Denies joint stiffness, decreased range of motion, muscle weakness, and gout.  Skin       Complains of bruising.      Denies unusual mles/lumps or sores and hair/skin or nail changes.  Psych       Denies mood changes, temper/anger issues, anxiety/stress, speech problems, depression, and sleep problems. Other Comments: pt states that she injured her RT lower leg  when she fell getting into the bath tub 1 1/2 wk ago.  now she has redness, swelling and inflammation.    Past History:  Past Medical History: Reviewed history from 12/15/2009 and no changes required. obesity infertility Type II DM 4-09 with retinopathy HTN  Past Surgical History: Reviewed history from 07/31/2007 and no changes required. none  Family History: Reviewed history from 07/31/2007 and no changes required. father died of melanoma at 41 mother alive, DM sister DM, HTN 2 older brothers healthy  Social History: Reviewed history from 07/31/2007 and no changes required. Homemaker.  Married to [place]. Looking into Fostering kids/ adopting. Used to work in [employer].  Moved from [place] in 2008. Quit smoking in 1988. Rare ETOH. Max weight 340, trying to lose.  No exercise.   Physical Exam General appearance: well developed, obese,  no acute distress MSE: oriented to time,  place, and person RLE: resolving bruises on her upper shin.  No bony tenderness either on anterior tibia or fibula, normal gait.  She has some pitting edema in her ankle.  There is also an area of redness anterior shin that is warm.   Assessment New Problems: CELLULITIS, LEG, RIGHT (ICD-682.6)   Plan New Medications/Changes: KEFLEX 500 MG CAPS (CEPHALEXIN) 1 by mouth three times a day for 7 days  #21 x 0, 01/27/2011, Hoyt Koch MD  New Orders: New Patient Level III [phone] Planning Comments:   Will treat for cellulitis of R shin with Keflex.  DDx includes erythema nodosum.  Encourage elevating leg, compression stockings, rest, ice to reduce swelling.  I don't feel a need for an Xray today since no bony pain.  Follow-up with your primary care physician if not improving or if getting worse.  Be sure to control your blood sugar.   The patient and/or caregiver has been counseled thoroughly with regard to medications prescribed including dosage, schedule, interactions, rationale for use, and possible side effects and they verbalize understanding.  Diagnoses and expected course of recovery discussed and will return if not improved as expected or if the condition worsens. Patient and/or caregiver verbalized understanding.  Prescriptions: KEFLEX 500 MG CAPS (CEPHALEXIN) 1 by mouth three times a day for 7 days  #21 x 0   Entered and Authorized by:   Hoyt Koch MD   Signed by:   Hoyt Koch MD on 01/27/2011   Method used:   Print then Give to Patient   RxID:   [id number]   Orders Added: 1)  New Patient Level III [95621]

## 2011-07-07 ENCOUNTER — Ambulatory Visit: Payer: BC Managed Care – PPO

## 2011-07-15 ENCOUNTER — Encounter: Payer: Self-pay | Admitting: Family Medicine

## 2011-07-15 ENCOUNTER — Ambulatory Visit (INDEPENDENT_AMBULATORY_CARE_PROVIDER_SITE_OTHER): Payer: BC Managed Care – PPO | Admitting: Family Medicine

## 2011-07-15 DIAGNOSIS — I10 Essential (primary) hypertension: Secondary | ICD-10-CM

## 2011-07-15 DIAGNOSIS — E119 Type 2 diabetes mellitus without complications: Secondary | ICD-10-CM

## 2011-07-15 MED ORDER — LIRAGLUTIDE 18 MG/3ML ~~LOC~~ SOLN: 1.2000 mg | Freq: Every day | SUBCUTANEOUS | Status: DC

## 2011-07-15 MED ORDER — TELMISARTAN-HCTZ 80-25 MG PO TABS: 1.0000 | ORAL_TABLET | Freq: Every day | ORAL | Status: DC

## 2011-07-15 MED ORDER — INSULIN PEN NEEDLE 32G X 4 MM MISC: Status: DC

## 2011-07-15 NOTE — Progress Notes (Signed)
Subjective:    Patient ID: Laurie Hoffman, female    DOB: 12-22-1965, 46 y.o.   MRN: [id number]  Diabetes She presents for her follow-up diabetic visit. She has type 2 diabetes mellitus. Her disease course has been stable. There are no hypoglycemic associated symptoms. Pertinent negatives for diabetes include no blurred vision, no chest pain, no foot paresthesias, no foot ulcerations, no polydipsia, no polyuria and no visual change. There are no hypoglycemic complications. Symptoms are stable. Her weight is stable. She participates in exercise daily. Her breakfast blood glucose range is generally 110-130 mg/dl. Her dinner blood glucose is taken after 8 pm. Her dinner blood glucose range is generally 140-180 mg/dl. An ACE inhibitor/angiotensin II receptor blocker is being taken. Eye exam is current.    She has been walking 15 minutes a day for the last month and says she's actually lost 5 pounds. She says she is trying to do the right thing with her diet but significantly better.  Hypertension-no chest pain or shortness of breath no problems. His history consistent with her medications. She tries to eat a low-salt diet.  Review of Systems  Eyes: Negative for blurred vision.  Cardiovascular: Negative for chest pain.  Genitourinary: Negative for polyuria.  Hematological: Negative for polydipsia.   There were no vitals taken for this visit.    No Known Allergies  Past Medical History  Diagnosis Date  . Obesity   . Infertility, female   . Diabetes mellitus 04-09    type 2/ with retinopathy  . Hypertension     No past surgical history on file.  History   Social History  . Marital Status: Married    Spouse Name: N/A    Number of Children: N/A  . Years of Education: N/A   Occupational History  . Not on file.   Social History Main Topics  . Smoking status: Former Smoker    Types: Cigarettes    Quit date: 05/17/1986  . Smokeless tobacco: Not on file  . Alcohol Use: Yes     Rare    . Drug Use: No  . Sexually Active:    Other Topics Concern  . Not on file   Social History Narrative  . No narrative on file    Family History  Problem Relation Age of Onset  . Diabetes Mother   . Cancer Father 61    melanoma  . Diabetes Sister   . Hypertension Sister     @[email or web address]       Objective:   Physical Exam  Constitutional: She is oriented to person, place, and time. She appears well-developed and well-nourished.       She is morbidly obese.   HENT:  Head: Normocephalic and atraumatic.  Cardiovascular: Normal rate, regular rhythm and normal heart sounds.   Pulmonary/Chest: Effort normal and breath sounds normal.  Neurological: She is alert and oriented to person, place, and time.  Skin: Skin is warm and dry.  Psychiatric: She has a normal mood and affect. Her behavior is normal.          Assessment & Plan:  DM - not well controlled. Her A1c is up from last time. She has recently started exercising for her so she will continue this and work on weight loss which would make a big improvement. We will also stop her glipizide and start Victoza. Discussed potential SE of the medication.  I gave her a sample and then a straight how to use the pen. She actually gave herself  the first dose here in the office today.  HTN- not well controlled. I increased her Micardis to 80/25 mg. A significant recurrence. Followup in 6 weeks to recheck blood pressure. Also gave her handout on low salt diet. I congratulated her on her recent exercise and encouraged her to keep it up. Weight reduction will make a significant improvement in her blood pressure.  Obesity-work on diet and exercise. The victoza may help as well.

## 2011-07-15 NOTE — Patient Instructions (Signed)

## 2011-07-16 ENCOUNTER — Ambulatory Visit: Payer: BC Managed Care – PPO | Admitting: Family Medicine

## 2011-07-29 ENCOUNTER — Other Ambulatory Visit: Payer: Self-pay | Admitting: *Deleted

## 2011-07-29 MED ORDER — LIRAGLUTIDE 18 MG/3ML ~~LOC~~ SOLN: 1.2000 mg | Freq: Every day | SUBCUTANEOUS | Status: DC

## 2011-07-29 MED ORDER — TELMISARTAN-HCTZ 80-25 MG PO TABS: 1.0000 | ORAL_TABLET | Freq: Every day | ORAL | Status: DC

## 2011-07-29 MED ORDER — METFORMIN HCL 1000 MG PO TABS: 1000.0000 mg | ORAL_TABLET | Freq: Two times a day (BID) | ORAL | Status: DC

## 2011-07-29 MED ORDER — ATORVASTATIN CALCIUM 20 MG PO TABS: 20.0000 mg | ORAL_TABLET | Freq: Every day | ORAL | Status: DC

## 2011-08-27 ENCOUNTER — Encounter: Payer: Self-pay | Admitting: *Deleted

## 2011-08-30 ENCOUNTER — Ambulatory Visit (INDEPENDENT_AMBULATORY_CARE_PROVIDER_SITE_OTHER): Payer: BC Managed Care – PPO | Admitting: Family Medicine

## 2011-08-30 ENCOUNTER — Encounter: Payer: Self-pay | Admitting: Family Medicine

## 2011-08-30 VITALS — BP 131/72 | HR 91 | Ht 62.25 in | Wt 315.0 lb

## 2011-08-30 DIAGNOSIS — I10 Essential (primary) hypertension: Secondary | ICD-10-CM

## 2011-08-30 DIAGNOSIS — E119 Type 2 diabetes mellitus without complications: Secondary | ICD-10-CM

## 2011-08-30 NOTE — Progress Notes (Signed)
  Subjective:    Patient ID: Laurie Hoffman, female    DOB: Aug 15, 1965, 46 y.o.   MRN: [id number]  Hypertension This is a chronic problem. The current episode started more than 1 year ago. The problem is unchanged. The problem is controlled. Pertinent negatives include no blurred vision, chest pain or shortness of breath. There are no associated agents to hypertension.  Diabetes She presents for her follow-up diabetic visit. She has type 2 diabetes mellitus. Her disease course has been stable. There are no hypoglycemic associated symptoms. Pertinent negatives for diabetes include no blurred vision, no chest pain, no foot paresthesias, no foot ulcerations, no polydipsia, no polyphagia, no polyuria and no visual change. There are no hypoglycemic complications. Symptoms are stable. Risk factors for coronary artery disease include obesity. Current diabetic treatment includes oral agent (dual therapy). She is compliant with treatment all of the time. She is following a generally healthy diet. When asked about meal planning, she reported none. She participates in exercise three times a week. Her home blood glucose trend is fluctuating minimally. Her breakfast blood glucose range is generally 110-130 mg/dl. An ACE inhibitor/angiotensin II receptor blocker is being taken. Eye exam is current.   Has lost 6 lbs on the vitoza.    Review of Systems  Eyes: Negative for blurred vision.  Respiratory: Negative for shortness of breath.   Cardiovascular: Negative for chest pain.  Genitourinary: Negative for polyuria.  Hematological: Negative for polydipsia and polyphagia.       Objective:   Physical Exam  Constitutional: She is oriented to person, place, and time. She appears well-developed and well-nourished.  HENT:  Head: Normocephalic and atraumatic.  Cardiovascular: Normal rate, regular rhythm and normal heart sounds.        Radials 2+ bilat.   Pulmonary/Chest: Effort normal and breath sounds normal.    Neurological: She is alert and oriented to person, place, and time.  Skin: Skin is warm and dry.  Psychiatric: She has a normal mood and affect. Her behavior is normal.          Assessment & Plan:  DM with retinopathy- Doing well overall. Fasting are now below 130.  We discussed continuing to work on diet she still struggles with. She is bit she is still eating a lot of carbohydrates. Follow up in 6 weeks and she'll be due for her next A1c at that point in time. He'll continue with the Victoza she's tolerating it very well without any side effects. She did have some occasional nausea the first week but says that has resolved completely. She has also successfully lost 6 pounds since I last saw her.  Hypertension-well controlled. At goal.

## 2011-09-27 ENCOUNTER — Other Ambulatory Visit: Payer: Self-pay | Admitting: Family Medicine

## 2011-09-28 LAB — COMPLETE METABOLIC PANEL WITH GFR
ALT: 23 U/L (ref 0–35)
BUN: 23 mg/dL (ref 6–23)
CO2: 25 mEq/L (ref 19–32)
Calcium: 9.2 mg/dL (ref 8.4–10.5)
Chloride: 99 mEq/L (ref 96–112)
Creat: 0.87 mg/dL (ref 0.50–1.10)
GFR, Est African American: >89 mL/min
GFR, Est Non African American: 81 mL/min
Glucose, Bld: 236 mg/dL — ABNORMAL HIGH (ref 70–99)

## 2011-09-28 LAB — LIPID PANEL
Cholesterol: 122 mg/dL (ref 0–200)
LDL Cholesterol: 65 mg/dL (ref 0–99)
Triglycerides: 82 mg/dL (ref <150)

## 2011-09-29 NOTE — Progress Notes (Signed)
Quick Note:  All labs are normal. ______ 

## 2011-10-12 ENCOUNTER — Ambulatory Visit (INDEPENDENT_AMBULATORY_CARE_PROVIDER_SITE_OTHER): Payer: BC Managed Care – PPO | Admitting: Family Medicine

## 2011-10-12 VITALS — BP 135/74 | HR 101 | Wt 313.0 lb

## 2011-10-12 DIAGNOSIS — E1139 Type 2 diabetes mellitus with other diabetic ophthalmic complication: Secondary | ICD-10-CM

## 2011-10-12 DIAGNOSIS — E119 Type 2 diabetes mellitus without complications: Secondary | ICD-10-CM

## 2011-10-12 DIAGNOSIS — IMO0001 Reserved for inherently not codable concepts without codable children: Secondary | ICD-10-CM

## 2011-10-12 MED ORDER — TELMISARTAN-HCTZ 80-25 MG PO TABS: 1.0000 | ORAL_TABLET | Freq: Every day | ORAL | Status: DC

## 2011-10-12 MED ORDER — GLIPIZIDE 5 MG PO TABS: 5.0000 mg | ORAL_TABLET | Freq: Two times a day (BID) | ORAL | Status: DC

## 2011-10-12 MED ORDER — LIRAGLUTIDE 18 MG/3ML ~~LOC~~ SOLN: 1.8000 mg | Freq: Every day | SUBCUTANEOUS | Status: DC

## 2011-10-12 MED ORDER — METFORMIN HCL 1000 MG PO TABS: 1000.0000 mg | ORAL_TABLET | Freq: Two times a day (BID) | ORAL | Status: DC

## 2011-10-12 NOTE — Progress Notes (Signed)
  Subjective:    Patient ID: Laurie Hoffman, female    DOB: 08-Nov-1965, 46 y.o.   MRN: [id number]  HPI Here for diabetic f/u. Due A1C today.  Sugars running in the 220.  Using vitoza 1.2 daily and using metformin.  Says hasn't been walking much this month.  She used to be on glipizide. Tolerated well.  Due for rx. No hypoglycemic events. No cuts or sores on her feet that are not healing well. She does have a history of diabetic retinopathy.   Review of Systems     Objective:   Physical Exam  Constitutional: She is oriented to person, place, and time. She appears well-developed and well-nourished.  HENT:  Head: Normocephalic and atraumatic.  Cardiovascular: Normal rate, regular rhythm and normal heart sounds.   Pulmonary/Chest: Effort normal and breath sounds normal.  Neurological: She is alert and oriented to person, place, and time.  Skin: Skin is warm and dry.  Psychiatric: She has a normal mood and affect. Her behavior is normal.          Assessment & Plan:  DM- Continue metformin  Increase the victoza to 1.8.  Will add some glipizide but will need to monitor for lows. Pt understands. A1C is 10.  Discussed starting to get more exercise since has fallen off in the last month.  This makes a huge difference in her sugars.  F/U in 6 weeks.  Lab Results  Component Value Date   HGBA1C 9.6 07/15/2011   Obesity - Congratulated her on her 40 lb weight loss. Keep up the momemtum and get back into exercise.

## 2011-10-12 NOTE — Patient Instructions (Signed)
Start the glipizide Continue metformin Increase your victoza to 1.8.

## 2011-11-23 ENCOUNTER — Ambulatory Visit: Payer: BC Managed Care – PPO | Admitting: Family Medicine

## 2011-12-01 ENCOUNTER — Ambulatory Visit (INDEPENDENT_AMBULATORY_CARE_PROVIDER_SITE_OTHER): Payer: BC Managed Care – PPO | Admitting: Family Medicine

## 2011-12-01 ENCOUNTER — Encounter: Payer: Self-pay | Admitting: Family Medicine

## 2011-12-01 VITALS — BP 112/68 | HR 112 | Ht 62.25 in | Wt 312.0 lb

## 2011-12-01 DIAGNOSIS — E669 Obesity, unspecified: Secondary | ICD-10-CM

## 2011-12-01 DIAGNOSIS — IMO0001 Reserved for inherently not codable concepts without codable children: Secondary | ICD-10-CM

## 2011-12-01 NOTE — Progress Notes (Signed)
  Subjective:    Patient ID: Laurie Hoffman, female    DOB: 14-Mar-1966, 46 y.o.   MRN: [id number]  HPI DM- 120-130 in AM, and after dinner 150-200.Says still strugging with her diet and has been able to bicycle. On metfomrin, glipizide, and vittoza. On ARB and ASA.  Taking statin at night.  No hypglycomic events. Very consistant with her meds  Did go on vacation and admits didn't eat well.    Review of Systems     Objective:   Physical Exam  Constitutional: She is oriented to person, place, and time. She appears well-developed and well-nourished.  HENT:  Head: Normocephalic and atraumatic.  Cardiovascular: Normal rate, regular rhythm and normal heart sounds.   Pulmonary/Chest: Effort normal and breath sounds normal.  Neurological: She is alert and oriented to person, place, and time.  Skin: Skin is warm and dry.  Psychiatric: She has a normal mood and affect. Her behavior is normal.          Assessment & Plan:  DM- uncontrolled. Discussed options to add insulin or continue to really work on diet and weight loss for at least the next 6 weeks. Her am sugars seem well controlled but PM sugars are too high. Increased veggies and dec carbs.  Will hold off on insulin for 6 more weeks if can.  Increase exercise. Walking  On treadmil for 8-10 min a couple of times per week.   Obeity - work on weight loss. I stressed how important this is to her body processing glucose.

## 2012-01-13 ENCOUNTER — Encounter: Payer: Self-pay | Admitting: Family Medicine

## 2012-01-13 ENCOUNTER — Ambulatory Visit (INDEPENDENT_AMBULATORY_CARE_PROVIDER_SITE_OTHER): Payer: BC Managed Care – PPO | Admitting: Family Medicine

## 2012-01-13 VITALS — BP 136/82 | HR 88 | Wt 308.0 lb

## 2012-01-13 DIAGNOSIS — E119 Type 2 diabetes mellitus without complications: Secondary | ICD-10-CM

## 2012-01-13 MED ORDER — INSULIN PEN NEEDLE 32G X 4 MM MISC: Status: DC

## 2012-01-13 MED ORDER — GLIPIZIDE 5 MG PO TABS: 5.0000 mg | ORAL_TABLET | Freq: Two times a day (BID) | ORAL | Status: DC

## 2012-01-13 MED ORDER — LIRAGLUTIDE 18 MG/3ML ~~LOC~~ SOLN: 1.8000 mg | Freq: Every day | SUBCUTANEOUS | Status: DC

## 2012-01-13 NOTE — Progress Notes (Signed)
  Subjective:    Patient ID: Laurie Hoffman, female    DOB: [birth year], 46 y.o.   MRN: [id number]  Diabetes She presents for her follow-up diabetic visit. She has type 2 diabetes mellitus. Her disease course has been stable. There are no hypoglycemic associated symptoms. Pertinent negatives for diabetes include no blurred vision, no chest pain, no foot paresthesias, no foot ulcerations, no polydipsia, no polyphagia, no polyuria and no weight loss. Symptoms are stable. She is following a diabetic diet. She participates in exercise three times a week. An ACE inhibitor/angiotensin II receptor blocker is being taken. Eye exam is current.      Review of Systems  Constitutional: Negative for weight loss.  Eyes: Negative for blurred vision.  Cardiovascular: Negative for chest pain.  Genitourinary: Negative for polyuria.  Hematological: Negative for polydipsia and polyphagia.       Objective:   Physical Exam  Constitutional: She is oriented to person, place, and time. She appears well-developed and well-nourished.  HENT:  Head: Normocephalic and atraumatic.  Neck: Neck supple. No thyromegaly present.  Cardiovascular: Normal rate, regular rhythm and normal heart sounds.   Pulmonary/Chest: Effort normal and breath sounds normal.  Lymphadenopathy:    She has no cervical adenopathy.  Neurological: She is alert and oriented to person, place, and time.  Skin: Skin is warm and dry.  Psychiatric: She has a normal mood and affect. Her behavior is normal.          Assessment & Plan:  DM- Has lost 4 more lbs.  Tolerating meds well. Much better.  She has started exercising some.  No lows. No change to regimen. Continue to work on exercise and diet.  F/u in 3 months. At that point have her A1c is not at goal and her 7 then we will need to discuss adjusting her medication. I really think she is on fantastic job if she can keep up the exercise program and continue to lose weight I think she would be  fantastic.

## 2012-01-31 ENCOUNTER — Telehealth: Payer: Self-pay | Admitting: Physician Assistant

## 2012-01-31 DIAGNOSIS — Z1239 Encounter for other screening for malignant neoplasm of breast: Secondary | ICD-10-CM

## 2012-01-31 NOTE — Telephone Encounter (Signed)
Pt aware.

## 2012-01-31 NOTE — Telephone Encounter (Signed)
Call pt: Let her know she has not had mammogram in our records since 2009. It is important to get yearly screenings. We have sent order downstairs. Encourage patient to get mammogram.

## 2012-04-05 ENCOUNTER — Other Ambulatory Visit: Payer: Self-pay

## 2012-04-05 DIAGNOSIS — E119 Type 2 diabetes mellitus without complications: Secondary | ICD-10-CM

## 2012-04-05 MED ORDER — ATORVASTATIN CALCIUM 20 MG PO TABS: 20.0000 mg | ORAL_TABLET | Freq: Every day | ORAL | Status: DC

## 2012-04-05 MED ORDER — AMBULATORY NON FORMULARY MEDICATION: Status: DC

## 2012-04-17 ENCOUNTER — Ambulatory Visit (INDEPENDENT_AMBULATORY_CARE_PROVIDER_SITE_OTHER): Payer: BC Managed Care – PPO | Admitting: Family Medicine

## 2012-04-17 ENCOUNTER — Encounter: Payer: Self-pay | Admitting: Family Medicine

## 2012-04-17 VITALS — BP 129/72 | HR 106 | Ht 63.0 in | Wt 314.0 lb

## 2012-04-17 DIAGNOSIS — I10 Essential (primary) hypertension: Secondary | ICD-10-CM

## 2012-04-17 DIAGNOSIS — E119 Type 2 diabetes mellitus without complications: Secondary | ICD-10-CM

## 2012-04-17 NOTE — Progress Notes (Signed)
  Subjective:    Patient ID: Laurie Hoffman, female    DOB: 1965-11-17, 46 y.o.   MRN: [id number]  HPI DM- no exercise.  Weight is up some. She is not following her diet.  She is taking her meds regularly.  Her dog of 14 years died and she is veyr tearful about it today.  She is down and says has not been eating what she should.    HTN - no CP or SOB. Taking meds well.    Review of Systems     Objective:   Physical Exam  Constitutional: She is oriented to person, place, and time. She appears well-developed and well-nourished.  HENT:  Head: Normocephalic and atraumatic.  Cardiovascular: Normal rate, regular rhythm and normal heart sounds.   Pulmonary/Chest: Effort normal and breath sounds normal.  Neurological: She is alert and oriented to person, place, and time.  Skin: Skin is warm and dry.  Psychiatric: She has a normal mood and affect. Her behavior is normal.          Assessment & Plan:  DM- BP well controlled.  Discussed ways ot motivate her.  Her A1C is 6.9. Get back on exercise and diet routine though.  Keeping meds where they are. I don't want her to get off track with this.  F/U in 3 mo. Had flu vaccine.  Says waiting until jan to do her Mammo. i offered nutrition counseling. She will think about it.  Asked her to strongly consider it.   HTN - Well controlled. Continue current regimen.   Lab Results  Component Value Date   CHOL 122 09/27/2011   HDL 41 09/27/2011   LDLCALC 65 09/27/2011   TRIG 82 09/27/2011   CHOLHDL 3.0 09/27/2011

## 2012-06-04 ENCOUNTER — Other Ambulatory Visit: Payer: Self-pay | Admitting: Family Medicine

## 2012-06-30 ENCOUNTER — Other Ambulatory Visit: Payer: Self-pay | Admitting: *Deleted

## 2012-06-30 MED ORDER — GLIPIZIDE 5 MG PO TABS: 20.0000 mg | ORAL_TABLET | Freq: Every day | ORAL | Status: DC

## 2012-06-30 MED ORDER — ATORVASTATIN CALCIUM 20 MG PO TABS: 20.0000 mg | ORAL_TABLET | Freq: Every day | ORAL | Status: DC

## 2012-06-30 NOTE — Telephone Encounter (Signed)
Sent first 2 refills to the wrong pharmacy so i resent them to correct one.

## 2012-07-17 ENCOUNTER — Ambulatory Visit: Payer: BC Managed Care – PPO | Admitting: Family Medicine

## 2012-08-10 ENCOUNTER — Ambulatory Visit (INDEPENDENT_AMBULATORY_CARE_PROVIDER_SITE_OTHER): Payer: BC Managed Care – PPO | Admitting: Family Medicine

## 2012-08-10 ENCOUNTER — Encounter: Payer: Self-pay | Admitting: Family Medicine

## 2012-08-10 VITALS — BP 114/65 | HR 94 | Wt 311.0 lb

## 2012-08-10 DIAGNOSIS — I10 Essential (primary) hypertension: Secondary | ICD-10-CM

## 2012-08-10 DIAGNOSIS — E119 Type 2 diabetes mellitus without complications: Secondary | ICD-10-CM

## 2012-08-10 LAB — POCT UA - MICROALBUMIN
Creatinine, POC: 200 mg/dL
Microalbumin Ur, POC: 10 mg/dL

## 2012-08-10 LAB — POCT GLYCOSYLATED HEMOGLOBIN (HGB A1C): Hemoglobin A1C: 7.4

## 2012-08-10 MED ORDER — AMBULATORY NON FORMULARY MEDICATION: Status: DC

## 2012-08-10 NOTE — Progress Notes (Signed)
  Subjective:    Patient ID: Laurie Hoffman, female    DOB: 1965/11/11, 47 y.o.   MRN: [id number]  HPI DM- Says checking sugars and running round 170. No regular exercise.  No cuts or sores that aren't healing well. ON victoza, metformin and glipizide. No hypoglycemic events.   HTN-  Pt denies chest pain, SOB, dizziness, or heart palpitations.  Taking meds as directed w/o problems.  Denies medication side effects.    Obesity - no regular exericse. Admits hasn't been the best with her diet.   Review of Systems     Objective:   Physical Exam  Constitutional: She is oriented to person, place, and time. She appears well-developed and well-nourished.  HENT:  Head: Normocephalic and atraumatic.  Cardiovascular: Normal rate, regular rhythm and normal heart sounds.   Pulmonary/Chest: Effort normal and breath sounds normal.  Neurological: She is alert and oriented to person, place, and time.  Skin: Skin is warm and dry.  Psychiatric: She has a normal mood and affect. Her behavior is normal.          Assessment & Plan:  DM-  Uncontrolled.  Up from last time. We discussed different options. We know that she can control her A1c on her current regimen if she is just careful with her diet and exercises like she was doing before. The other option would be to add insulin to her regimen. She said she really wanted to give it the next 3 months to try get back on track with diet and exercise and if at that point her hemoglobin A1c is over 7 then we will need to add insulin. Patient agrees to care plan. On ARB,m statin. F/U in 3 months.  Lab Results  Component Value Date   HGBA1C 7.4 08/10/2012     HTN- WEll controlled.    Morbid Obesity - encouraged more regular exercise and weight loss.   Reminded her to get her mammogram.

## 2012-08-14 ENCOUNTER — Telehealth: Payer: Self-pay | Admitting: Family Medicine

## 2012-08-14 NOTE — Telephone Encounter (Signed)
Call pt: Due for mammo. WAs orderd in September but looks like she never went. Encourage he to go sometime soon if able.

## 2012-08-15 NOTE — Telephone Encounter (Signed)
Called and lmovm asking for return call. Would like to encourage pt to have mammo done.Laurie Hoffman [place]

## 2012-08-15 NOTE — Telephone Encounter (Signed)
Called pt and encouraged her to get her mammo done she understood and agreed.Laurie Hoffman [place]

## 2012-09-27 ENCOUNTER — Other Ambulatory Visit: Payer: Self-pay | Admitting: *Deleted

## 2012-09-27 DIAGNOSIS — E119 Type 2 diabetes mellitus without complications: Secondary | ICD-10-CM

## 2012-09-27 MED ORDER — LIRAGLUTIDE 18 MG/3ML ~~LOC~~ SOPN: 0.3000 mL | PEN_INJECTOR | Freq: Every day | SUBCUTANEOUS | Status: DC

## 2012-10-13 ENCOUNTER — Other Ambulatory Visit: Payer: Self-pay | Admitting: *Deleted

## 2012-10-13 MED ORDER — METFORMIN HCL 1000 MG PO TABS: ORAL_TABLET | ORAL | Status: DC

## 2012-11-10 ENCOUNTER — Telehealth: Payer: Self-pay | Admitting: Family Medicine

## 2012-11-10 NOTE — Telephone Encounter (Signed)
Call patient: Please remind her that her last Pap smear was exactly 3 years ago meaning that she is now due. Please encouraged her to followup for Pap smear or if she has a GYN to make an appointment with them to have this done this summer.

## 2012-11-10 NOTE — Telephone Encounter (Signed)
Lvm.Kristof Nadeem [place]  

## 2012-11-12 ENCOUNTER — Other Ambulatory Visit: Payer: Self-pay | Admitting: Family Medicine

## 2012-11-13 NOTE — Telephone Encounter (Signed)
appt made for 10.2014.Laurie Hoffman

## 2012-11-14 ENCOUNTER — Ambulatory Visit (INDEPENDENT_AMBULATORY_CARE_PROVIDER_SITE_OTHER): Payer: BC Managed Care – PPO | Admitting: Family Medicine

## 2012-11-14 ENCOUNTER — Encounter: Payer: Self-pay | Admitting: Family Medicine

## 2012-11-14 VITALS — BP 110/62 | HR 88 | Ht 63.0 in | Wt 311.0 lb

## 2012-11-14 DIAGNOSIS — Z1231 Encounter for screening mammogram for malignant neoplasm of breast: Secondary | ICD-10-CM

## 2012-11-14 DIAGNOSIS — E119 Type 2 diabetes mellitus without complications: Secondary | ICD-10-CM

## 2012-11-14 DIAGNOSIS — I10 Essential (primary) hypertension: Secondary | ICD-10-CM

## 2012-11-14 DIAGNOSIS — M25473 Effusion, unspecified ankle: Secondary | ICD-10-CM

## 2012-11-14 DIAGNOSIS — R609 Edema, unspecified: Secondary | ICD-10-CM

## 2012-11-14 LAB — COMPLETE METABOLIC PANEL WITH GFR
Alkaline Phosphatase: 87 U/L (ref 39–117)
Creat: 1.09 mg/dL (ref 0.50–1.10)
GFR, Est African American: 71 mL/min
GFR, Est Non African American: 61 mL/min
Glucose, Bld: 107 mg/dL — ABNORMAL HIGH (ref 70–99)
Sodium: 136 mEq/L (ref 135–145)
Total Bilirubin: 0.4 mg/dL (ref 0.3–1.2)
Total Protein: 7.6 g/dL (ref 6.0–8.3)

## 2012-11-14 LAB — LIPID PANEL
HDL: 40 mg/dL (ref >39)
Total CHOL/HDL Ratio: 3.2 Ratio
Triglycerides: 82 mg/dL (ref <150)

## 2012-11-14 LAB — POCT GLYCOSYLATED HEMOGLOBIN (HGB A1C): Hemoglobin A1C: 6.6

## 2012-11-14 MED ORDER — TELMISARTAN-HCTZ 80-25 MG PO TABS: 1.0000 | ORAL_TABLET | Freq: Every day | ORAL | Status: DC

## 2012-11-14 MED ORDER — METFORMIN HCL 1000 MG PO TABS: ORAL_TABLET | ORAL | Status: DC

## 2012-11-14 MED ORDER — AMBULATORY NON FORMULARY MEDICATION: Status: DC

## 2012-11-14 MED ORDER — GLIPIZIDE 5 MG PO TABS: 20.0000 mg | ORAL_TABLET | Freq: Every day | ORAL | Status: DC

## 2012-11-14 NOTE — Progress Notes (Addendum)
Subjective:    Patient ID: Laurie Hoffman, female    DOB: 1966/01/06, 47 y.o.   MRN: [id number]  HPI DM- well controlled.  No hypoglycemic evetns. Thinks maybe running a little high at night.  Has been walking on the treadmill. Has noticed a little swelling around her feet. Worse by end of the day.  No known triggers.  No CP or SOB.  No new meds. She is already on HCTZ.   HTN-  Pt denies chest pain, SOB, dizziness, or heart palpitations.  Taking meds as directed w/o problems.  Denies medication side effects.    Review of Systems BP 110/62  Pulse 88  Ht 5\' 3"  (1.6 m)  Wt 311 lb (141.069 kg)  BMI 55.11 kg/m2    No Known Allergies  Past Medical History  Diagnosis Date  . Obesity   . Infertility, female   . Diabetes mellitus 04-09    type 2/ with retinopathy  . Hypertension     No past surgical history on file.  History   Social History  . Marital Status: Married    Spouse Name: N/A    Number of Children: N/A  . Years of Education: N/A   Occupational History  . Not on file.   Social History Main Topics  . Smoking status: Former Smoker    Types: Cigarettes    Quit date: 05/17/1986  . Smokeless tobacco: Not on file  . Alcohol Use: Yes     Comment: Rare  . Drug Use: No  . Sexually Active:    Other Topics Concern  . Not on file   Social History Narrative  . No narrative on file    Family History  Problem Relation Age of Onset  . Diabetes Mother     Insulin  . Cancer Father 30    melanoma  . Diabetes Sister     Insulin  . Hypertension Sister     Outpatient Encounter Prescriptions as of 11/14/2012  Medication Sig Dispense Refill  . AF Lancets Super Thin MISC Use twice daily as directed  200 each  3  . AMBULATORY NON FORMULARY MEDICATION Medication Name:  One touch ultra test strips  120 strip  0  . aspirin (ASPIRIN CHILDRENS) 81 MG chewable tablet Chew 81 mg by mouth daily.        Marland Kitchen atorvastatin (LIPITOR) 20 MG tablet Take 1 tablet (20 mg total) by mouth  daily.  90 tablet  0  . glipiZIDE (GLUCOTROL) 5 MG tablet Take 4 tablets (20 mg total) by mouth daily.  180 tablet  0  . Insulin Pen Needle 32G X 4 MM MISC Novotwist 32G disposable needles. 1 box  300 each  2  . Liraglutide (VICTOZA) 18 MG/3ML SOPN Inject 0.3 mLs into the skin daily. 0.7mLs (1.8 mg total)  27 mL  11  . metFORMIN (GLUCOPHAGE) 1000 MG tablet TAKE 1 TABLET BY MOUTH TWICE DAILY WITH MEALS  60 tablet  0  . METRONIDAZOLE, TOPICAL, 0.75 % LOTN Use bid as directed  3 Bottle  1  . [DISCONTINUED] AMBULATORY NON FORMULARY MEDICATION Medication Name:  One touch ultra test strips  120 strip  0  . [DISCONTINUED] glipiZIDE (GLUCOTROL) 5 MG tablet Take 4 tablets (20 mg total) by mouth daily.  180 tablet  0  . [DISCONTINUED] metFORMIN (GLUCOPHAGE) 1000 MG tablet TAKE 1 TABLET BY MOUTH TWICE DAILY WITH MEALS  60 tablet  0  . telmisartan-hydrochlorothiazide (MICARDIS HCT) 80-25 MG per tablet Take 1 tablet  by mouth daily.  90 tablet  2  . [DISCONTINUED] telmisartan-hydrochlorothiazide (MICARDIS HCT) 80-25 MG per tablet Take 1 tablet by mouth daily.  90 tablet  2   No facility-administered encounter medications on file as of 11/14/2012.          Objective:   Physical Exam  Constitutional: She is oriented to person, place, and time. She appears well-developed and well-nourished.  +obese  HENT:  Head: Normocephalic and atraumatic.  Cardiovascular: Normal rate, regular rhythm and normal heart sounds.   Pulmonary/Chest: Effort normal and breath sounds normal.  Musculoskeletal: She exhibits edema.  Trace ankle edema on the left   Neurological: She is alert and oriented to person, place, and time.  Skin: Skin is warm and dry.  Psychiatric: She has a normal mood and affect. Her behavior is normal.          Assessment & Plan:  DM- well controlled. Keep up diet and exercise. F/U in 3 months. On glipizide, metformin, and victoza.  She just got her reminder for eye exam so plans to schedule.   Pneumovax is UTD.   Lab Results  Component Value Date   HGBA1C 6.6 11/14/2012     HTN- well controlled.  F/U in 6 months.   Ankle swelling- none on exam today. We will check kdineys and liver.  Discussed low salt diet. Does wears compression stocking some.

## 2012-11-14 NOTE — Patient Instructions (Signed)

## 2012-11-15 LAB — TSH: TSH: 3.887 u[IU]/mL (ref 0.350–4.500)

## 2012-11-23 ENCOUNTER — Other Ambulatory Visit: Payer: Self-pay

## 2013-01-05 ENCOUNTER — Other Ambulatory Visit: Payer: Self-pay

## 2013-01-05 MED ORDER — AMBULATORY NON FORMULARY MEDICATION: Status: DC

## 2013-01-05 MED ORDER — METFORMIN HCL 1000 MG PO TABS: ORAL_TABLET | ORAL | Status: DC

## 2013-01-05 MED ORDER — GLIPIZIDE 5 MG PO TABS: 20.0000 mg | ORAL_TABLET | Freq: Every day | ORAL | Status: DC

## 2013-02-12 ENCOUNTER — Encounter: Payer: Self-pay | Admitting: Physician Assistant

## 2013-02-12 ENCOUNTER — Ambulatory Visit (INDEPENDENT_AMBULATORY_CARE_PROVIDER_SITE_OTHER): Payer: BC Managed Care – PPO | Admitting: Physician Assistant

## 2013-02-12 VITALS — BP 146/76 | HR 108 | Temp 98.1°F | Wt 309.0 lb

## 2013-02-12 DIAGNOSIS — J069 Acute upper respiratory infection, unspecified: Secondary | ICD-10-CM

## 2013-02-12 DIAGNOSIS — R05 Cough: Secondary | ICD-10-CM

## 2013-02-12 DIAGNOSIS — R059 Cough, unspecified: Secondary | ICD-10-CM

## 2013-02-12 DIAGNOSIS — R062 Wheezing: Secondary | ICD-10-CM

## 2013-02-12 MED ORDER — AMOXICILLIN-POT CLAVULANATE 875-125 MG PO TABS: 1.0000 | ORAL_TABLET | Freq: Two times a day (BID) | ORAL | Status: DC

## 2013-02-12 MED ORDER — ALBUTEROL SULFATE HFA 108 (90 BASE) MCG/ACT IN AERS: 2.0000 | INHALATION_SPRAY | Freq: Four times a day (QID) | RESPIRATORY_TRACT | Status: DC | PRN

## 2013-02-12 NOTE — Patient Instructions (Signed)

## 2013-02-12 NOTE — Progress Notes (Signed)
  Subjective:    Patient ID: Laurie Hoffman, female    DOB: 01-27-1966, 47 y.o.   MRN: [id number]  HPI Patient is a 47 yo female who presents to the clinic with cold symptoms for 3 days. She is concerned because her husband has cancer and having chemo treatments. She knows he is more suspectable to infection. She has been taking alka setzer and Zicam but symptoms not improving. She has a lot of chest congestion and felt like she was wheezing today. She denies any asthma history. She is coughing a lot but not able to get anything up. She had some ST but has resolved. She feels weak and very congested. Denies any ear pain or fever.     Review of Systems     Objective:   Physical Exam  Constitutional: She appears well-developed and well-nourished.  HENT:  Head: Normocephalic and atraumatic.  Right Ear: External ear normal.  Left Ear: External ear normal.  Nose: Nose normal.  Mouth/Throat: Oropharynx is clear and moist. No oropharyngeal exudate.  TM's clear.  Negative for bilateral maxillary sinus tenderness to palpation.  Eyes: Conjunctivae are normal. Right eye exhibits no discharge. Left eye exhibits no discharge.  Neck: Normal range of motion. Neck supple.  Cardiovascular: Regular rhythm and normal heart sounds.   Tachycardia at 108.  Pulmonary/Chest: Effort normal and breath sounds normal. She has no wheezes.  Lymphadenopathy:    She has no cervical adenopathy.  Skin:  Flushed cheeks.   Psychiatric: She has a normal mood and affect. Her behavior is normal.          Assessment & Plan:  URI/cough/wheezing- I do think viral. Since husband is sick will get CXR to rule out pneumonia. Discussed hand washing, do not sleep in same room, mask, not preparing food. If not improving in next 3 days or worsening gave rx for augmentin to take for 10 days. Gave handout for other symptomatic care and encouraged mucinex. Also gave albuterol to use as needed for wheezing and cough.

## 2013-02-14 ENCOUNTER — Other Ambulatory Visit (HOSPITAL_COMMUNITY)
Admission: RE | Admit: 2013-02-14 | Discharge: 2013-02-14 | Disposition: A | Discharge status: Home / Self Care | Payer: BC Managed Care – PPO | Source: Ambulatory Visit | Attending: Family Medicine | Admitting: Family Medicine

## 2013-02-14 ENCOUNTER — Ambulatory Visit (INDEPENDENT_AMBULATORY_CARE_PROVIDER_SITE_OTHER): Payer: BC Managed Care – PPO | Admitting: Family Medicine

## 2013-02-14 ENCOUNTER — Encounter: Payer: Self-pay | Admitting: Family Medicine

## 2013-02-14 VITALS — BP 127/73 | HR 99 | Wt 308.0 lb

## 2013-02-14 DIAGNOSIS — IMO0001 Reserved for inherently not codable concepts without codable children: Secondary | ICD-10-CM

## 2013-02-14 DIAGNOSIS — Z1231 Encounter for screening mammogram for malignant neoplasm of breast: Secondary | ICD-10-CM

## 2013-02-14 DIAGNOSIS — E119 Type 2 diabetes mellitus without complications: Secondary | ICD-10-CM

## 2013-02-14 DIAGNOSIS — Z0000 Encounter for general adult medical examination without abnormal findings: Secondary | ICD-10-CM

## 2013-02-14 DIAGNOSIS — Z23 Encounter for immunization: Secondary | ICD-10-CM

## 2013-02-14 DIAGNOSIS — Z01419 Encounter for gynecological examination (general) (routine) without abnormal findings: Secondary | ICD-10-CM | POA: Insufficient documentation

## 2013-02-14 DIAGNOSIS — Z1151 Encounter for screening for human papillomavirus (HPV): Secondary | ICD-10-CM | POA: Insufficient documentation

## 2013-02-14 LAB — POCT GLYCOSYLATED HEMOGLOBIN (HGB A1C): HEMOGLOBIN A1C: 7.3

## 2013-02-14 MED ORDER — CANAGLIFLOZIN 100 MG PO TABS: 1.0000 | ORAL_TABLET | Freq: Every day | ORAL | Status: DC

## 2013-02-14 MED ORDER — NYSTATIN 100000 UNIT/GM EX POWD: Freq: Four times a day (QID) | CUTANEOUS | Status: DC

## 2013-02-14 MED ORDER — METFORMIN HCL 1000 MG PO TABS: ORAL_TABLET | ORAL | Status: DC

## 2013-02-14 NOTE — Progress Notes (Signed)
Subjective:     Laurie Hoffman is a 47 y.o. female and is here for a comprehensive physical exam. The patient reports no problems. Wokring with Dr. Seymour Bars for weight loss. Has been exercising and has lost some weight since last OV. She was seen about 3 days ago for acute UR illness.   DM- no hypolgycemic events. No wounds that aren't healing well.  Says has fogotten some doses.  She has been stressed as her husband was dx woth osteoscarcoma and is getting chemo.    History   Social History  . Marital Status: Married    Spouse Name: N/A    Number of Children: N/A  . Years of Education: N/A   Occupational History  . Not on file.   Social History Main Topics  . Smoking status: Former Smoker    Types: Cigarettes    Quit date: 05/17/1986  . Smokeless tobacco: Not on file  . Alcohol Use: Yes     Comment: Rare  . Drug Use: No  . Sexual Activity:    Other Topics Concern  . Not on file   Social History Narrative  . No narrative on file   Health Maintenance  Topic Date Due  . Mammogram  10/03/2008  . Ophthalmology Exam  05/17/2012  . Pneumococcal Polysaccharide Vaccine (#2) 08/29/2012  . Pap Smear  10/15/2012  . Influenza Vaccine  12/15/2012  . Hemoglobin A1c  05/17/2013  . Foot Exam  08/10/2013  . Urine Microalbumin  08/10/2013  . Tetanus/tdap  12/05/2018    The following portions of the patient's history were reviewed and updated as appropriate: allergies, current medications, past family history, past medical history, past social history, past surgical history and problem list.  Review of Systems A comprehensive review of systems was negative.   Objective:   .There were no vitals taken for this visit.  General Appearance:    Alert, cooperative, no distress, appears stated age  Head:    Normocephalic, without obvious abnormality, atraumatic  Eyes:    PERRL, conjunctiva/corneas clear, EOM's intact, both eyes  Ears:    Normal TM's and external ear canals, both ears   Nose:   Nares normal, septum midline, mucosa normal, no drainage    or sinus tenderness  Throat:   Lips, mucosa, and tongue normal; teeth and gums normal  Neck:   Supple, symmetrical, trachea midline, no adenopathy;    thyroid:  no enlargement/tenderness/nodules; no carotid   bruit or JVD  Back:     Symmetric, no curvature, ROM normal, no CVA tenderness  Lungs:     Clear to auscultation bilaterally, respirations unlabored  Chest Wall:    No tenderness or deformity   Heart:    Regular rate and rhythm, S1 and S2 normal, no murmur, rub   or gallop  Breast Exam:    No tenderness, masses, or nipple abnormality  Abdomen:     Soft, non-tender, bowel sounds active all four quadrants,    no masses, no organomegaly  Genitalia:    Normal female without lesion, discharge or tenderness. Skin tag just above the clitoris.   Rectal:    Not perfromed.   Extremities:   Extremities normal, atraumatic, no cyanosis or edema  Pulses:   2+ and symmetric all extremities  Skin:   Skin color, texture, turgor normal, mildly erythemaout rash around the umbilicus that is consistant with yeast.   Lymph nodes:   Cervical, supraclavicular, and axillary nodes normal  Neurologic:   CNII-XII intact,  normal strength, sensation and reflexes    throughout    Assessment:    Healthy female exam.    DM     Plan:     See After Visit Summary for Counseling Recommendations  Keep up a regular exercise program and make sure you are eating a healthy diet Try to eat 4 servings of dairy a day, or if you are lactose intolerant take a calcium with vitamin D daily.  Your vaccines are up to date.   DM- due for eye exam.  Uncontrolled at 7.3. Has increased.  Try to be consistant with meds. She's been a little stressed with her and her husband his chemotherapy appointments and admits that she is sometimes forgot her pills and even her Victoza. For now we'll discontinue glipizide and try a newer medication called Invokana. Has a  little bit more powerful lowering A1c. I did discuss with her the increased risk of yeast infections and urinary tract infections. Says she will have to let me know this is a problem. I gave her 10 days worth of samples and a coupon card for 30 day free trial. If she tolerates that well then we can get her a new prescription with a new coupon card that makes it free for a year.  Skin candidiasis - will tx with nystatin powder.   Flu vaccine given today.   Due for mammo. Ordreplaced.

## 2013-02-14 NOTE — Patient Instructions (Addendum)
Please remember to get your eyes examined.   Keep up a regular exercise program and make sure you are eating a healthy diet Try to eat 4 servings of dairy a day, or if you are lactose intolerant take a calcium with vitamin D daily.  Your vaccines are up to date.

## 2013-03-02 ENCOUNTER — Encounter: Payer: Self-pay | Admitting: Family Medicine

## 2013-03-02 ENCOUNTER — Other Ambulatory Visit: Payer: Self-pay | Admitting: Family Medicine

## 2013-03-02 MED ORDER — CANAGLIFLOZIN 100 MG PO TABS: 1.0000 | ORAL_TABLET | Freq: Every day | ORAL | Status: DC

## 2013-03-08 ENCOUNTER — Ambulatory Visit (INDEPENDENT_AMBULATORY_CARE_PROVIDER_SITE_OTHER): Payer: BC Managed Care – PPO

## 2013-03-08 DIAGNOSIS — Z1231 Encounter for screening mammogram for malignant neoplasm of breast: Secondary | ICD-10-CM

## 2013-05-18 ENCOUNTER — Ambulatory Visit (INDEPENDENT_AMBULATORY_CARE_PROVIDER_SITE_OTHER): Payer: BC Managed Care – PPO | Admitting: Family Medicine

## 2013-05-18 ENCOUNTER — Encounter: Payer: Self-pay | Admitting: Family Medicine

## 2013-05-18 VITALS — BP 117/68 | HR 78 | Temp 98.1°F | Ht 64.0 in | Wt 283.0 lb

## 2013-05-18 DIAGNOSIS — I10 Essential (primary) hypertension: Secondary | ICD-10-CM

## 2013-05-18 DIAGNOSIS — E119 Type 2 diabetes mellitus without complications: Secondary | ICD-10-CM

## 2013-05-18 LAB — POCT GLYCOSYLATED HEMOGLOBIN (HGB A1C)
Hemoglobin A1C: 7.3
Hemoglobin A1C: 7.4

## 2013-05-18 MED ORDER — CANAGLIFLOZIN 100 MG PO TABS: 1.0000 | ORAL_TABLET | Freq: Every day | ORAL | Status: DC

## 2013-05-18 NOTE — Progress Notes (Signed)
   Subjective:    Patient ID: Laurie Hoffman, female    DOB: 06/05/1965, 48 y.o.   MRN: [id number]  HPI Diabetes - no hypoglycemic events. No wounds or sores that are not healing well. No increased thirst or urination. Checking glucose at home. Taking medications as prescribed without any side effects.  Has been walkin for exercise and has lost some weight.  Lab Results  Component Value Date   HGBA1C 6.6 11/14/2012     Hypertension - Pt denies chest pain, SOB, dizziness, or heart palpitations.  Taking meds as directed w/o problems.  Denies medication side effects.    Review of Systems     Objective:   Physical Exam  Constitutional: She is oriented to person, place, and time. She appears well-developed and well-nourished.  HENT:  Head: Normocephalic and atraumatic.  Cardiovascular: Normal rate, regular rhythm and normal heart sounds.   Pulmonary/Chest: Effort normal and breath sounds normal.  Neurological: She is alert and oriented to person, place, and time.  Skin: Skin is warm and dry.  Psychiatric: She has a normal mood and affect. Her behavior is normal.          Assessment & Plan:  Diabetes-she does have a history of diabetic retinopathy. Under fair control, not at goal but she is working really hard on exercise, diet and weight loss. Will hold off on changing medication and continue to work on this and f/u in 3 mo. Continue current regimen. Followup in 3-4 months. Has eye appointment scheduled for later this month.  Hypertension- Well controlled. F/U in 3-4 months.   Obesity/BMI 48 - goal to be 250. Has already lost > 25 lbs Congratulated her on this. Keep up the good work

## 2013-06-25 ENCOUNTER — Other Ambulatory Visit: Payer: Self-pay | Admitting: Family Medicine

## 2013-08-16 ENCOUNTER — Other Ambulatory Visit: Payer: Self-pay | Admitting: Family Medicine

## 2013-08-16 ENCOUNTER — Encounter: Payer: Self-pay | Admitting: Family Medicine

## 2013-08-16 ENCOUNTER — Ambulatory Visit (INDEPENDENT_AMBULATORY_CARE_PROVIDER_SITE_OTHER): Payer: BC Managed Care – PPO | Admitting: Family Medicine

## 2013-08-16 VITALS — BP 118/72 | HR 75 | Ht 64.0 in | Wt 276.0 lb

## 2013-08-16 DIAGNOSIS — I10 Essential (primary) hypertension: Secondary | ICD-10-CM

## 2013-08-16 DIAGNOSIS — E119 Type 2 diabetes mellitus without complications: Secondary | ICD-10-CM

## 2013-08-16 DIAGNOSIS — E1139 Type 2 diabetes mellitus with other diabetic ophthalmic complication: Secondary | ICD-10-CM

## 2013-08-16 LAB — POCT UA - MICROALBUMIN
CREATININE, POC: 100 mg/dL
MICROALBUMIN (UR) POC: 10 mg/L

## 2013-08-16 LAB — POCT GLYCOSYLATED HEMOGLOBIN (HGB A1C): HEMOGLOBIN A1C: 7.4

## 2013-08-16 MED ORDER — CANAGLIFLOZIN 100 MG PO TABS: 1.0000 | ORAL_TABLET | Freq: Every day | ORAL | Status: DC

## 2013-08-16 MED ORDER — CANAGLIFLOZIN-METFORMIN HCL 150-1000 MG PO TABS: 1.0000 | ORAL_TABLET | Freq: Two times a day (BID) | ORAL | Status: DC

## 2013-08-16 MED ORDER — TELMISARTAN-HCTZ 40-12.5 MG PO TABS: 1.0000 | ORAL_TABLET | Freq: Every day | ORAL | Status: DC

## 2013-08-16 NOTE — Progress Notes (Signed)
Spoke w/cindy at [facility] the order has been cancelled.Laurie Hoffman [place]

## 2013-08-16 NOTE — Progress Notes (Signed)
   Subjective:    Patient ID: Laurie Hoffman, female    DOB: 1965/12/30, 48 y.o.   MRN: [id number]  HPI Diabetes - no hypoglycemic events. No wounds or sores that are not healing well. No increased thirst or urination. Checking glucose at home. Taking medications as prescribed without any side effects. She is on Invokana, Victoza, metformin.  Hasn't been walking at much.   Hypertension- Pt denies chest pain, SOB, dizziness, or heart palpitations.  Taking meds as directed w/o problems.  Denies medication side effects.    SEvere obesity - has not been exercising as regularly as she was but she's been still watching what she is eating. She admits last couple weeks she really hasn't been following her diet very well.  Review of Systems     Objective:   Physical Exam  Constitutional: She is oriented to person, place, and time. She appears well-developed and well-nourished.  HENT:  Head: Normocephalic and atraumatic.  Cardiovascular: Normal rate, regular rhythm and normal heart sounds.   Pulmonary/Chest: Effort normal and breath sounds normal.  Neurological: She is alert and oriented to person, place, and time.  Skin: Skin is warm and dry.  Psychiatric: She has a normal mood and affect. Her behavior is normal.          Assessment & Plan:   Diabetes-Uncontrolled at 7.2.  she is on a statin, aspirin, ARB. Consider changing to Invokamet.She continues to work on her diet and exercise. Would like to be down to 260lbs in [place].  Work on diet and exercise to get sugars back under control.  Urine microalbumin and foot exam performed today.  Hypertension-Well controlled.   BP actually low at home under 110. Will decrease Micrdis to 40/12.5. F/u in 3 months.   Severe obesity-discussed continue work on diet and exercise. She's made fantastic progress and has lost over 30 pounds in the last year and a half. Encourage her to keep it up and hopefully we can start to continue to decrease medications as she  gets her weight under better control.

## 2013-08-26 ENCOUNTER — Other Ambulatory Visit: Payer: Self-pay | Admitting: Physician Assistant

## 2013-08-27 ENCOUNTER — Other Ambulatory Visit: Payer: Self-pay | Admitting: *Deleted

## 2013-08-27 MED ORDER — ALBUTEROL SULFATE HFA 108 (90 BASE) MCG/ACT IN AERS: 2.0000 | INHALATION_SPRAY | Freq: Four times a day (QID) | RESPIRATORY_TRACT | Status: DC | PRN

## 2013-09-12 ENCOUNTER — Other Ambulatory Visit: Payer: Self-pay | Admitting: Family Medicine

## 2013-11-13 ENCOUNTER — Other Ambulatory Visit: Payer: Self-pay | Admitting: Family Medicine

## 2013-11-15 ENCOUNTER — Ambulatory Visit: Payer: BC Managed Care – PPO | Admitting: Family Medicine

## 2013-11-21 ENCOUNTER — Ambulatory Visit (INDEPENDENT_AMBULATORY_CARE_PROVIDER_SITE_OTHER): Payer: BC Managed Care – PPO | Admitting: Family Medicine

## 2013-11-21 ENCOUNTER — Encounter: Payer: Self-pay | Admitting: Family Medicine

## 2013-11-21 VITALS — BP 135/69 | HR 99 | Ht 64.0 in | Wt 273.0 lb

## 2013-11-21 DIAGNOSIS — E1139 Type 2 diabetes mellitus with other diabetic ophthalmic complication: Secondary | ICD-10-CM

## 2013-11-21 DIAGNOSIS — I10 Essential (primary) hypertension: Secondary | ICD-10-CM

## 2013-11-21 DIAGNOSIS — E119 Type 2 diabetes mellitus without complications: Secondary | ICD-10-CM

## 2013-11-21 LAB — POCT GLYCOSYLATED HEMOGLOBIN (HGB A1C): Hemoglobin A1C: 7.2

## 2013-11-21 MED ORDER — METRONIDAZOLE 0.75 % EX LOTN: TOPICAL_LOTION | CUTANEOUS | Status: DC

## 2013-11-21 NOTE — Addendum Note (Signed)
Addended by: Beatris Ship L on: 11/21/2013 09:40 AM   Modules accepted: Orders

## 2013-11-21 NOTE — Progress Notes (Signed)
   Subjective:    Patient ID: Laurie Hoffman, female    DOB: [birth year], 48 y.o.   MRN: [id number]  HPI Diabetes - no hypoglycemic events. No wounds or sores that are not healing well. No increased thirst or urination. Checking glucose at home. Taking medications as prescribed without any side effects.  Currently on Invokamet and Victoza. Says her diet and exercise have not been great. Started walking again about 2 days ago.    Hypertension- actually decreased her my card as of the last office visit because her blood pressures were running a little bit low at home. Pt denies chest pain, SOB, dizziness, or heart palpitations.  Taking meds as directed w/o problems.  Denies medication side effects.    Obesity - she has lost about 3 pounds since she was here 3 months ago. She admits she really hasn't been doing a great job with her diet or exercise. She's been under a lot of stress lately. Her husband cancer is finally in remission he still has a little bit more chemotherapy. He just had a knee replacement about a week ago so she is really been taking care of him for the last couple months. She said she did start walking again about 2 days ago and plans to really get back in exercise and continue work on her weight over the summer.  Review of Systems     Objective:   Physical Exam  Constitutional: She is oriented to person, place, and time. She appears well-developed and well-nourished.  HENT:  Head: Normocephalic and atraumatic.  Cardiovascular: Normal rate, regular rhythm and normal heart sounds.   Pulmonary/Chest: Effort normal and breath sounds normal.  Neurological: She is alert and oriented to person, place, and time.  Skin: Skin is warm and dry.  Psychiatric: She has a normal mood and affect. Her behavior is normal.          Assessment & Plan:  DM- Uncontrolled. Her A1c is better today at 7.2 but still not quite at goal. Goal A1c is under 7.0. We discussed that again today. I really  feel like if she gets back on track with her diet and exercise she will meet her goal. And sensing her back in 3 months am going to see her back in 6 weeks just to make sure that we are on track so hopefully the next A1c will look fantastic.  She is on a statin, aspirin, and ARB.  HTN - Well controlled.  Continue current regimen.  Severe obesity, BMI greater than 40-  Work on getting back on track. Hopefully she will continue to work on this. She's made such great progress. She's lost about 45 pounds over the last year and a half to 2 years. Has been progressive. I also discussed with her today the potential to consider a weight loss medication if it something that she's interested in. It might help her with satiety and has really get some more weight off to help control her diabetes et Ronney Asters.

## 2013-11-24 LAB — BASIC METABOLIC PANEL WITH GFR
BUN: 19 mg/dL (ref 6–23)
CALCIUM: 9.1 mg/dL (ref 8.4–10.5)
CO2: 24 mEq/L (ref 19–32)
Chloride: 99 mEq/L (ref 96–112)
Creat: 0.83 mg/dL (ref 0.50–1.10)
GFR, EST NON AFRICAN AMERICAN: 85 mL/min
Glucose, Bld: 123 mg/dL — ABNORMAL HIGH (ref 70–99)
POTASSIUM: 4.5 meq/L (ref 3.5–5.3)
Sodium: 135 mEq/L (ref 135–145)

## 2013-11-24 LAB — LIPID PANEL
CHOL/HDL RATIO: 3.8 ratio
CHOLESTEROL: 183 mg/dL (ref 0–200)
HDL: 48 mg/dL (ref >39)
LDL Cholesterol: 108 mg/dL — ABNORMAL HIGH (ref 0–99)
Triglycerides: 136 mg/dL (ref <150)
VLDL: 27 mg/dL (ref 0–40)

## 2013-11-27 ENCOUNTER — Other Ambulatory Visit: Payer: Self-pay | Admitting: *Deleted

## 2013-11-27 DIAGNOSIS — E785 Hyperlipidemia, unspecified: Secondary | ICD-10-CM

## 2013-11-30 ENCOUNTER — Other Ambulatory Visit: Payer: Self-pay | Admitting: *Deleted

## 2013-11-30 DIAGNOSIS — E785 Hyperlipidemia, unspecified: Secondary | ICD-10-CM

## 2014-01-02 ENCOUNTER — Ambulatory Visit (INDEPENDENT_AMBULATORY_CARE_PROVIDER_SITE_OTHER): Payer: BC Managed Care – PPO | Admitting: Family Medicine

## 2014-01-02 ENCOUNTER — Encounter: Payer: Self-pay | Admitting: Family Medicine

## 2014-01-02 VITALS — BP 117/69 | HR 74 | Ht 64.0 in | Wt 273.0 lb

## 2014-01-02 DIAGNOSIS — IMO0001 Reserved for inherently not codable concepts without codable children: Secondary | ICD-10-CM

## 2014-01-02 DIAGNOSIS — E1165 Type 2 diabetes mellitus with hyperglycemia: Principal | ICD-10-CM

## 2014-01-02 NOTE — Progress Notes (Signed)
   Subjective:    Patient ID: Laurie Hoffman, female    DOB: [birth year], 48 y.o.   MRN: [id number]  Diabetes   6 week f/u for Diabetes.  Her last A1C was 7.2 not at goal and we discussed getting back on track with diet and exercise.  Weight is stable. Has been exercising more the last 6 weeks. Has been eating well. Home number running 130-170.    Review of Systems     Objective:   Physical Exam  Constitutional: She is oriented to person, place, and time. She appears well-developed and well-nourished.  HENT:  Head: Normocephalic and atraumatic.  Cardiovascular: Normal rate, regular rhythm and normal heart sounds.   Pulmonary/Chest: Effort normal and breath sounds normal.  Neurological: She is alert and oriented to person, place, and time.  Skin: Skin is warm and dry.  Psychiatric: She has a normal mood and affect. Her behavior is normal.          Assessment & Plan:  Decline flu shot. Will get at work.   Diabetes  -I. do think she's getting back on track with exercise. Encouraged her to keep. At this get her A1c under 7 when I see her back in 6 weeks. The sugars are running a little bit high but these her evening sugars, not fasting morning sugars. Certainly if she notices an increase in her numbers call me before the next followup so that we can make an adjustment. Explained her that the next step would be to add long-acting insulin at bedtime and I would really like to see her be able to avoid this.

## 2014-01-04 ENCOUNTER — Other Ambulatory Visit: Payer: Self-pay | Admitting: Family Medicine

## 2014-02-13 ENCOUNTER — Ambulatory Visit: Payer: BC Managed Care – PPO | Admitting: Family Medicine

## 2014-03-11 ENCOUNTER — Other Ambulatory Visit: Payer: Self-pay | Admitting: Family Medicine

## 2014-06-13 ENCOUNTER — Other Ambulatory Visit: Payer: Self-pay | Admitting: Family Medicine

## 2014-06-14 NOTE — Telephone Encounter (Signed)
Needs appt before future refills

## 2014-07-10 ENCOUNTER — Encounter: Payer: Self-pay | Admitting: Family Medicine

## 2014-07-10 ENCOUNTER — Ambulatory Visit (INDEPENDENT_AMBULATORY_CARE_PROVIDER_SITE_OTHER): Payer: BLUE CROSS/BLUE SHIELD | Admitting: Family Medicine

## 2014-07-10 VITALS — BP 136/75 | HR 78 | Wt 276.0 lb

## 2014-07-10 DIAGNOSIS — Z1231 Encounter for screening mammogram for malignant neoplasm of breast: Secondary | ICD-10-CM | POA: Diagnosis not present

## 2014-07-10 DIAGNOSIS — I10 Essential (primary) hypertension: Secondary | ICD-10-CM

## 2014-07-10 DIAGNOSIS — E119 Type 2 diabetes mellitus without complications: Secondary | ICD-10-CM | POA: Diagnosis not present

## 2014-07-10 LAB — POCT GLYCOSYLATED HEMOGLOBIN (HGB A1C): HEMOGLOBIN A1C: 6.8

## 2014-07-10 MED ORDER — AMBULATORY NON FORMULARY MEDICATION: Status: DC

## 2014-07-10 MED ORDER — AF LANCETS SUPER THIN MISC: Status: DC

## 2014-07-10 NOTE — Progress Notes (Signed)
   Subjective:    Patient ID: Laurie Hoffman, female    DOB: Apr 23, 1966, 49 y.o.   MRN: [id number]  HPI Diabetes - no hypoglycemic events. No wounds or sores that are not healing well. No increased thirst or urination. Not checking glucose at home. Taking medications as prescribed without any side effects. Overdue for eye exam. Husband has been battling cancer the last 1.5 years. He i snow cancer free as of 2 weeks ago.   Hypertension- Pt denies chest pain, SOB, dizziness, or heart palpitations.  Taking meds as directed w/o problems.  Denies medication side effects.     Review of Systems     Objective:   Physical Exam  Constitutional: She is oriented to person, place, and time. She appears well-developed and well-nourished.  HENT:  Head: Normocephalic and atraumatic.  Cardiovascular: Normal rate, regular rhythm and normal heart sounds.   Pulmonary/Chest: Effort normal and breath sounds normal.  Neurological: She is alert and oriented to person, place, and time.  Skin: Skin is warm and dry.  Psychiatric: She has a normal mood and affect. Her behavior is normal.          Assessment & Plan:  Diabetes -Well controlled.  She plans to schedule eye exam. Pneumococcal UTD.    HTN - well controlled.  Continue current regimen.

## 2014-07-11 LAB — BASIC METABOLIC PANEL WITH GFR
BUN: 20 mg/dL (ref 6–23)
CALCIUM: 9.4 mg/dL (ref 8.4–10.5)
CO2: 27 mEq/L (ref 19–32)
CREATININE: 0.93 mg/dL (ref 0.50–1.10)
Chloride: 101 mEq/L (ref 96–112)
GFR, EST AFRICAN AMERICAN: 85 mL/min
GFR, EST NON AFRICAN AMERICAN: 74 mL/min
Glucose, Bld: 130 mg/dL — ABNORMAL HIGH (ref 70–99)
Potassium: 4.4 mEq/L (ref 3.5–5.3)
Sodium: 138 mEq/L (ref 135–145)

## 2014-07-11 NOTE — Progress Notes (Signed)
Quick Note:  All labs are normal. ______ 

## 2014-07-25 ENCOUNTER — Ambulatory Visit (INDEPENDENT_AMBULATORY_CARE_PROVIDER_SITE_OTHER): Payer: BLUE CROSS/BLUE SHIELD

## 2014-07-25 DIAGNOSIS — Z1231 Encounter for screening mammogram for malignant neoplasm of breast: Secondary | ICD-10-CM

## 2014-08-10 ENCOUNTER — Other Ambulatory Visit: Payer: Self-pay | Admitting: Family Medicine

## 2014-09-09 ENCOUNTER — Other Ambulatory Visit: Payer: Self-pay | Admitting: Family Medicine

## 2014-09-12 LAB — HM DIABETES EYE EXAM

## 2014-09-13 ENCOUNTER — Other Ambulatory Visit: Payer: Self-pay | Admitting: Family Medicine

## 2014-10-02 ENCOUNTER — Other Ambulatory Visit: Payer: Self-pay | Admitting: Family Medicine

## 2014-10-08 ENCOUNTER — Encounter: Payer: Self-pay | Admitting: Family Medicine

## 2014-10-08 ENCOUNTER — Ambulatory Visit (INDEPENDENT_AMBULATORY_CARE_PROVIDER_SITE_OTHER): Payer: BLUE CROSS/BLUE SHIELD | Admitting: Family Medicine

## 2014-10-08 DIAGNOSIS — Z114 Encounter for screening for human immunodeficiency virus [HIV]: Secondary | ICD-10-CM

## 2014-10-08 DIAGNOSIS — E119 Type 2 diabetes mellitus without complications: Secondary | ICD-10-CM | POA: Diagnosis not present

## 2014-10-08 LAB — POCT UA - MICROALBUMIN
Creatinine, POC: 50 mg/dL
MICROALBUMIN (UR) POC: 10 mg/L

## 2014-10-08 LAB — POCT GLYCOSYLATED HEMOGLOBIN (HGB A1C): Hemoglobin A1C: 7.2

## 2014-10-08 MED ORDER — SIMVASTATIN 20 MG PO TABS: 20.0000 mg | ORAL_TABLET | Freq: Every day | ORAL | Status: DC

## 2014-10-08 MED ORDER — CANAGLIFLOZIN-METFORMIN HCL 150-1000 MG PO TABS: 1.0000 | ORAL_TABLET | Freq: Two times a day (BID) | ORAL | Status: DC

## 2014-10-08 NOTE — Progress Notes (Signed)
   Subjective:    Patient ID: Laurie Hoffman, female    DOB: [birth year], 49 y.o.   MRN: [id number]  HPI Diabetes - no hypoglycemic events. No wounds or sores that are not healing well. No increased thirst or urination. Checking glucose at home. Taking medications as prescribed without any side effects. Just had eye exam done in May - Dr. Jodi Mourning.     Hypertension- Pt denies chest pain, SOB, dizziness, or heart palpitations.  Taking meds as directed w/o problems.  Denies medication side effects.    Hyperlipidemia - says she actually quit taking her atorvastatin about 2 years ago because of weakness in her lower legs. She said it would affect her especially if she was active like gardening etc. She says she stopped the medication and got better.    Review of Systems     Objective:   Physical Exam  Constitutional: She is oriented to person, place, and time. She appears well-developed and well-nourished.  HENT:  Head: Normocephalic and atraumatic.  Cardiovascular: Normal rate, regular rhythm and normal heart sounds.   Pulmonary/Chest: Effort normal and breath sounds normal.  Neurological: She is alert and oriented to person, place, and time.  Skin: Skin is warm and dry.  Psychiatric: She has a normal mood and affect. Her behavior is normal.          Assessment & Plan:  DM-    Foot exam and urine micro done today.  Eye exam UTD.  Uncontrolled with A1C of 7.2. Discussed getting back on track and recheck in 3 months.   HTN- Well controlled.  Continue current regimen. Follow-up in 3 months.  Hyperlipidiemia - will change to simvastatin and see if tolerates better.. Check labs in one month.

## 2014-10-16 ENCOUNTER — Other Ambulatory Visit: Payer: Self-pay | Admitting: Family Medicine

## 2014-10-16 ENCOUNTER — Encounter: Payer: Self-pay | Admitting: Family Medicine

## 2014-10-17 ENCOUNTER — Other Ambulatory Visit: Payer: Self-pay | Admitting: Family Medicine

## 2014-10-18 ENCOUNTER — Other Ambulatory Visit: Payer: Self-pay | Admitting: *Deleted

## 2014-10-18 ENCOUNTER — Encounter: Payer: Self-pay | Admitting: Family Medicine

## 2014-10-18 MED ORDER — TELMISARTAN-HCTZ 40-12.5 MG PO TABS: ORAL_TABLET | ORAL | Status: DC

## 2014-12-09 ENCOUNTER — Other Ambulatory Visit: Payer: Self-pay | Admitting: Family Medicine

## 2015-01-03 ENCOUNTER — Encounter: Payer: Self-pay | Admitting: Family Medicine

## 2015-01-08 ENCOUNTER — Ambulatory Visit: Payer: BLUE CROSS/BLUE SHIELD | Admitting: Family Medicine

## 2015-03-12 ENCOUNTER — Encounter: Payer: Self-pay | Admitting: Family Medicine

## 2015-03-17 ENCOUNTER — Encounter: Payer: Self-pay | Admitting: Family Medicine

## 2015-03-17 ENCOUNTER — Ambulatory Visit (INDEPENDENT_AMBULATORY_CARE_PROVIDER_SITE_OTHER): Payer: BLUE CROSS/BLUE SHIELD | Admitting: Family Medicine

## 2015-03-17 VITALS — BP 109/69 | HR 81 | Ht 64.0 in | Wt 269.3 lb

## 2015-03-17 DIAGNOSIS — E119 Type 2 diabetes mellitus without complications: Secondary | ICD-10-CM | POA: Diagnosis not present

## 2015-03-17 DIAGNOSIS — I10 Essential (primary) hypertension: Secondary | ICD-10-CM

## 2015-03-17 LAB — BASIC METABOLIC PANEL
BUN: 22 mg/dL (ref 7–25)
CALCIUM: 9 mg/dL (ref 8.6–10.2)
CO2: 23 mmol/L (ref 20–31)
CREATININE: 0.95 mg/dL (ref 0.50–1.10)
Chloride: 101 mmol/L (ref 98–110)
GLUCOSE: 113 mg/dL — AB (ref 65–99)
Potassium: 4.5 mmol/L (ref 3.5–5.3)
Sodium: 135 mmol/L (ref 135–146)

## 2015-03-17 LAB — LIPID PANEL
CHOLESTEROL: 155 mg/dL (ref 125–200)
HDL: 36 mg/dL — ABNORMAL LOW (ref >=46)
LDL Cholesterol: 96 mg/dL (ref <130)
TRIGLYCERIDES: 117 mg/dL (ref <150)
Total CHOL/HDL Ratio: 4.3 Ratio (ref <=5.0)
VLDL: 23 mg/dL (ref <30)

## 2015-03-17 LAB — POCT GLYCOSYLATED HEMOGLOBIN (HGB A1C): Hemoglobin A1C: 7

## 2015-03-17 NOTE — Progress Notes (Signed)
   Subjective:    Patient ID: Laurie Hoffman, female    DOB: [birth year], 49 y.o.   MRN: [id number]  HPI Diabetes - no hypoglycemic events. No wounds or sores that are not healing well. No increased thirst or urination. Checking glucose at home. Taking medications as prescribed without any side effects. She is walkig a mile a day for about a month. She has lost 8 lbs since last here  Hypertension- Pt denies chest pain, SOB, dizziness, or heart palpitations.  Taking meds as directed w/o problems.  Denies medication side effects.     Review of Systems     Objective:   Physical Exam  Constitutional: She is oriented to person, place, and time. She appears well-developed and well-nourished.  HENT:  Head: Normocephalic and atraumatic.  Cardiovascular: Normal rate, regular rhythm and normal heart sounds.   Pulmonary/Chest: Effort normal and breath sounds normal.  Neurological: She is alert and oriented to person, place, and time.  Skin: Skin is warm and dry.  Psychiatric: She has a normal mood and affect. Her behavior is normal.          Assessment & Plan:  DM- improved. A1C down to 7.0.   Great job. Really congratulated her on stepping up her exercise and working on weight loss which is absolutely fantastic and I think she's made some great strides. Continue current regimen. Continue statin as well. She is due for labs today. Follow-up in 3 months.  HTN - well controlled.   Is not blood pressures a little bit low on the lower side today that she is asymptomatic. She does have a home blood pressure cuff so encouraged her to keep an eye on this. As she loses weight blood pressure may continue to drop and we may need to decrease her medication which would be fantastic.

## 2015-04-01 ENCOUNTER — Other Ambulatory Visit: Payer: Self-pay | Admitting: Family Medicine

## 2015-05-04 ENCOUNTER — Other Ambulatory Visit: Payer: Self-pay | Admitting: Family Medicine

## 2015-06-08 ENCOUNTER — Other Ambulatory Visit: Payer: Self-pay | Admitting: Family Medicine

## 2015-06-17 ENCOUNTER — Ambulatory Visit: Payer: BLUE CROSS/BLUE SHIELD | Admitting: Family Medicine

## 2015-06-24 ENCOUNTER — Other Ambulatory Visit: Payer: Self-pay | Admitting: Family Medicine

## 2015-07-03 ENCOUNTER — Telehealth: Payer: Self-pay | Admitting: Family Medicine

## 2015-07-03 ENCOUNTER — Other Ambulatory Visit: Payer: Self-pay | Admitting: Family Medicine

## 2015-07-03 NOTE — Telephone Encounter (Signed)
I called patient and left a message for her to call office and schedule a DM f/u with Dr. Madilyn Fireman

## 2015-07-30 ENCOUNTER — Ambulatory Visit (INDEPENDENT_AMBULATORY_CARE_PROVIDER_SITE_OTHER): Payer: BLUE CROSS/BLUE SHIELD | Admitting: Family Medicine

## 2015-07-30 ENCOUNTER — Encounter: Payer: Self-pay | Admitting: Family Medicine

## 2015-07-30 VITALS — BP 121/67 | HR 76 | Ht 64.0 in | Wt 272.0 lb

## 2015-07-30 DIAGNOSIS — E11319 Type 2 diabetes mellitus with unspecified diabetic retinopathy without macular edema: Secondary | ICD-10-CM

## 2015-07-30 DIAGNOSIS — I10 Essential (primary) hypertension: Secondary | ICD-10-CM | POA: Diagnosis not present

## 2015-07-30 DIAGNOSIS — Z1231 Encounter for screening mammogram for malignant neoplasm of breast: Secondary | ICD-10-CM

## 2015-07-30 LAB — POCT GLYCOSYLATED HEMOGLOBIN (HGB A1C): Hemoglobin A1C: 7.2

## 2015-07-30 MED ORDER — CANAGLIFLOZIN-METFORMIN HCL 150-1000 MG PO TABS: 1.0000 | ORAL_TABLET | Freq: Two times a day (BID) | ORAL | Status: DC

## 2015-07-30 MED ORDER — GLIPIZIDE ER 2.5 MG PO TB24
2.5000 mg | ORAL_TABLET | Freq: Every day | ORAL | Status: DC
Start: 2015-07-30 — End: 2016-01-14

## 2015-07-30 MED ORDER — TELMISARTAN-HCTZ 40-12.5 MG PO TABS: 1.0000 | ORAL_TABLET | Freq: Every day | ORAL | Status: DC

## 2015-07-30 NOTE — Progress Notes (Signed)
   Subjective:    Patient ID: Laurie Hoffman, female    DOB: Oct 27, 1965, 50 y.o.   MRN: [id number]  HPI Diabetes - no hypoglycemic events. No wounds or sores that are not healing well. No increased thirst or urination. Checking glucose at home. Taking medications as prescribed without any side effects.  Hypertension- Pt denies chest pain, SOB, dizziness, or heart palpitations.  Taking meds as directed w/o problems.  Denies medication side effects.     Morbid obesity-  She was doing well for about 6 months but says she hasn't really done it since last fall. She really would like to lose about 8 pounds in the next couple months for a program that she's been dissipating at work.   Review of Systems     Objective:   Physical Exam  Constitutional: She is oriented to person, place, and time. She appears well-developed and well-nourished.  HENT:  Head: Normocephalic and atraumatic.  Cardiovascular: Normal rate, regular rhythm and normal heart sounds.   Pulmonary/Chest: Effort normal and breath sounds normal.  Neurological: She is alert and oriented to person, place, and time.  Skin: Skin is warm and dry.  Psychiatric: She has a normal mood and affect. Her behavior is normal.          Assessment & Plan:  DM With retinopathy- uncontrolled. Hemoglobin A1c of 7.2 today which is up from previous of 7.0. She's actually gained almost 3 pounds since she was last here. She has not been exercising. Will add a low-dose of glipizide back to her regimen. We had initially stopped it because I feel like it could be contributing to her weight gain. But we'll start 2.5 mg extended release. Follow-up in 3 months.  HTN - controlled. Continue current regimen.  Morbid obesity/BMI greater than 40-we discussed the importance of getting back on track with exercise. Also getting back on track with calorie counting. She was doing well for about 6 months but says she hasn't really done it since last fall. She really  would like to lose about 8 pounds in the next couple months for a program that she's been dissipating at work.

## 2015-07-30 NOTE — Addendum Note (Signed)
Addended by: Teddy Spike on: 07/30/2015 09:47 AM   Modules accepted: Orders

## 2015-08-13 ENCOUNTER — Ambulatory Visit (INDEPENDENT_AMBULATORY_CARE_PROVIDER_SITE_OTHER): Payer: BLUE CROSS/BLUE SHIELD

## 2015-08-13 DIAGNOSIS — Z1231 Encounter for screening mammogram for malignant neoplasm of breast: Secondary | ICD-10-CM | POA: Diagnosis not present

## 2015-09-12 DIAGNOSIS — E119 Type 2 diabetes mellitus without complications: Secondary | ICD-10-CM | POA: Diagnosis not present

## 2015-09-12 LAB — HM DIABETES EYE EXAM

## 2015-09-15 DIAGNOSIS — I10 Essential (primary) hypertension: Secondary | ICD-10-CM | POA: Diagnosis not present

## 2015-09-15 DIAGNOSIS — E11319 Type 2 diabetes mellitus with unspecified diabetic retinopathy without macular edema: Secondary | ICD-10-CM | POA: Diagnosis not present

## 2015-09-16 ENCOUNTER — Encounter: Payer: Self-pay | Admitting: Family Medicine

## 2015-09-16 LAB — COMPLETE METABOLIC PANEL WITH GFR
ALBUMIN: 3.9 g/dL (ref 3.6–5.1)
ALK PHOS: 77 U/L (ref 33–115)
ALT: 14 U/L (ref 6–29)
AST: 14 U/L (ref 10–35)
BILIRUBIN TOTAL: 0.4 mg/dL (ref 0.2–1.2)
BUN: 23 mg/dL (ref 7–25)
CO2: 24 mmol/L (ref 20–31)
Calcium: 9.5 mg/dL (ref 8.6–10.2)
Chloride: 100 mmol/L (ref 98–110)
Creat: 0.93 mg/dL (ref 0.50–1.10)
GFR, EST NON AFRICAN AMERICAN: 73 mL/min (ref >=60)
GFR, Est African American: 84 mL/min (ref >=60)
GLUCOSE: 105 mg/dL — AB (ref 65–99)
Potassium: 4.6 mmol/L (ref 3.5–5.3)
SODIUM: 136 mmol/L (ref 135–146)
TOTAL PROTEIN: 6.9 g/dL (ref 6.1–8.1)

## 2015-09-16 NOTE — Progress Notes (Signed)
Quick Note:  All labs are normal. ______ 

## 2015-09-17 ENCOUNTER — Other Ambulatory Visit: Payer: Self-pay | Admitting: Family Medicine

## 2015-09-19 ENCOUNTER — Other Ambulatory Visit: Payer: Self-pay | Admitting: *Deleted

## 2015-09-19 MED ORDER — INSULIN PEN NEEDLE 32G X 5 MM MISC: Status: DC

## 2015-09-23 ENCOUNTER — Other Ambulatory Visit: Payer: Self-pay | Admitting: *Deleted

## 2015-09-23 DIAGNOSIS — E119 Type 2 diabetes mellitus without complications: Secondary | ICD-10-CM

## 2015-09-23 MED ORDER — INSULIN PEN NEEDLE 32G X 5 MM MISC: Status: DC

## 2015-10-29 ENCOUNTER — Ambulatory Visit: Payer: BLUE CROSS/BLUE SHIELD | Admitting: Family Medicine

## 2015-11-06 ENCOUNTER — Ambulatory Visit: Payer: Self-pay | Admitting: Family Medicine

## 2015-11-10 ENCOUNTER — Ambulatory Visit: Payer: BLUE CROSS/BLUE SHIELD | Admitting: Family Medicine

## 2015-12-10 ENCOUNTER — Other Ambulatory Visit: Payer: Self-pay | Admitting: Family Medicine

## 2016-01-01 ENCOUNTER — Other Ambulatory Visit: Payer: Self-pay | Admitting: Family Medicine

## 2016-01-14 ENCOUNTER — Other Ambulatory Visit: Payer: Self-pay | Admitting: Family Medicine

## 2016-01-28 ENCOUNTER — Telehealth: Payer: Self-pay | Admitting: Family Medicine

## 2016-01-28 NOTE — Telephone Encounter (Signed)
Left a msg for pt to call me back regarding her mychart msg for an appt. I thought I scheduled the appt but it is not showing up and I believe I sent a date to the pt.

## 2016-01-28 NOTE — Telephone Encounter (Signed)
lvm informing pt she has an appt on 02/03/16 @ 215 PM. If this appt doesn't work for her I advised that she call back to reschedule this.Laurie Hoffman'

## 2016-02-03 ENCOUNTER — Ambulatory Visit (INDEPENDENT_AMBULATORY_CARE_PROVIDER_SITE_OTHER): Payer: BLUE CROSS/BLUE SHIELD | Admitting: Family Medicine

## 2016-02-03 ENCOUNTER — Encounter: Payer: Self-pay | Admitting: Family Medicine

## 2016-02-03 VITALS — BP 110/65 | HR 100 | Ht 64.0 in | Wt 277.0 lb

## 2016-02-03 DIAGNOSIS — E119 Type 2 diabetes mellitus without complications: Secondary | ICD-10-CM

## 2016-02-03 DIAGNOSIS — E11319 Type 2 diabetes mellitus with unspecified diabetic retinopathy without macular edema: Secondary | ICD-10-CM | POA: Diagnosis not present

## 2016-02-03 DIAGNOSIS — Z23 Encounter for immunization: Secondary | ICD-10-CM | POA: Diagnosis not present

## 2016-02-03 DIAGNOSIS — I10 Essential (primary) hypertension: Secondary | ICD-10-CM | POA: Diagnosis not present

## 2016-02-03 LAB — POCT GLYCOSYLATED HEMOGLOBIN (HGB A1C): HEMOGLOBIN A1C: 6.6

## 2016-02-03 MED ORDER — LIRAGLUTIDE 18 MG/3ML ~~LOC~~ SOPN: 2.4000 mg | PEN_INJECTOR | Freq: Every day | SUBCUTANEOUS | 6 refills | Status: DC

## 2016-02-03 NOTE — Progress Notes (Signed)
Subjective:    CC: DM  HPI:  Diabetes - no hypoglycemic events. No wounds or sores that are not healing well. No increased thirst or urination. Checking glucose at home. Taking medications as prescribed without any side effects.Last A1c was 7.2.  Hypertension- Pt denies chest pain, SOB, dizziness, or heart palpitations.  Taking meds as directed w/o problems.  Denies medication side effects.    Retinopathy - eye exam is UTD.  She has gained some weight since last here.  She does really want to work on it herself.  Past medical history, Surgical history, Family history not pertinant except as noted below, Social history, Allergies, and medications have been entered into the medical record, reviewed, and corrections made.   Review of Systems: No fevers, chills, night sweats, weight loss, chest pain, or shortness of breath.   Objective:    General: Well Developed, well nourished, and in no acute distress.  Neuro: Alert and oriented x3, extra-ocular muscles intact, sensation grossly intact.  HEENT: Normocephalic, atraumatic  Skin: Warm and dry, no rashes. Cardiac: Regular rate and rhythm, no murmurs rubs or gallops, no lower extremity edema.  Respiratory: Clear to auscultation bilaterally. Not using accessory muscles, speaking in full sentences.   Impression and Recommendations:    DM - Well controlled. Hemoglobin A1c down to 6.6 today which is fantastic.   HTN - Well controlled. Continue current regimen. Follow up in  3-4 months.   Diabetic retinopathy-I exam is up-to-date. Next  Morbid obesity/BMI greater than 40-we discussed options including referral to bariatric clinic. She says she wants to think about it really wants to work on it herself. We also discussed increasing Victoza to 2.4 mg will do so today. Also recommend a program such as Weight [employer] to help give her some structure and set goals.  Flu vaccine given today.

## 2016-02-29 ENCOUNTER — Other Ambulatory Visit: Payer: Self-pay | Admitting: Family Medicine

## 2016-03-28 ENCOUNTER — Other Ambulatory Visit: Payer: Self-pay | Admitting: Family Medicine

## 2016-03-31 DIAGNOSIS — I10 Essential (primary) hypertension: Secondary | ICD-10-CM | POA: Diagnosis not present

## 2016-03-31 DIAGNOSIS — E119 Type 2 diabetes mellitus without complications: Secondary | ICD-10-CM | POA: Diagnosis not present

## 2016-03-31 LAB — BASIC METABOLIC PANEL WITH GFR
BUN: 25 mg/dL (ref 7–25)
CALCIUM: 9.8 mg/dL (ref 8.6–10.2)
CO2: 25 mmol/L (ref 20–31)
Chloride: 98 mmol/L (ref 98–110)
Creat: 0.99 mg/dL (ref 0.50–1.10)
GFR, EST AFRICAN AMERICAN: 78 mL/min (ref >=60)
GFR, EST NON AFRICAN AMERICAN: 68 mL/min (ref >=60)
GLUCOSE: 144 mg/dL — AB (ref 65–99)
Potassium: 4.5 mmol/L (ref 3.5–5.3)
Sodium: 136 mmol/L (ref 135–146)

## 2016-04-01 NOTE — Progress Notes (Signed)
All labs are normal. 

## 2016-05-03 ENCOUNTER — Other Ambulatory Visit: Payer: Self-pay | Admitting: Family Medicine

## 2016-05-04 ENCOUNTER — Ambulatory Visit: Payer: BLUE CROSS/BLUE SHIELD | Admitting: Family Medicine

## 2016-05-13 ENCOUNTER — Other Ambulatory Visit: Payer: Self-pay | Admitting: Family Medicine

## 2016-08-01 ENCOUNTER — Other Ambulatory Visit: Payer: Self-pay | Admitting: Family Medicine

## 2016-08-11 ENCOUNTER — Ambulatory Visit (INDEPENDENT_AMBULATORY_CARE_PROVIDER_SITE_OTHER): Payer: BLUE CROSS/BLUE SHIELD | Admitting: Physician Assistant

## 2016-08-11 ENCOUNTER — Encounter: Payer: Self-pay | Admitting: Physician Assistant

## 2016-08-11 VITALS — BP 130/81 | HR 99 | Temp 98.3°F | Wt 277.0 lb

## 2016-08-11 DIAGNOSIS — J069 Acute upper respiratory infection, unspecified: Secondary | ICD-10-CM

## 2016-08-11 MED ORDER — IPRATROPIUM-ALBUTEROL 0.5-2.5 (3) MG/3ML IN SOLN
3.0000 mL | Freq: Once | RESPIRATORY_TRACT | Status: AC
  Administered 2016-08-11: 3 mL via RESPIRATORY_TRACT

## 2016-08-11 MED ORDER — IPRATROPIUM-ALBUTEROL 0.5-2.5 (3) MG/3ML IN SOLN: 3.0000 mL | Freq: Once | RESPIRATORY_TRACT | Status: DC

## 2016-08-11 MED ORDER — BENZONATATE 200 MG PO CAPS: 200.0000 mg | ORAL_CAPSULE | Freq: Three times a day (TID) | ORAL | 0 refills | Status: DC | PRN

## 2016-08-11 MED ORDER — IPRATROPIUM BROMIDE 0.06 % NA SOLN: 1.0000 | Freq: Four times a day (QID) | NASAL | 0 refills | Status: DC | PRN

## 2016-08-11 MED ORDER — ALBUTEROL SULFATE HFA 108 (90 BASE) MCG/ACT IN AERS: 1.0000 | INHALATION_SPRAY | Freq: Four times a day (QID) | RESPIRATORY_TRACT | 0 refills | Status: DC | PRN

## 2016-08-11 NOTE — Progress Notes (Signed)
HPI:                                                                Laurie Hoffman is a 51 y.o. female who presents to [facility]: [facility] today for congestion  Patient with PMH of DM II, HTN, and allergic rhinitis reports nasal congestion and cough x 4 days. Cough is nonproductive. Endorses shortness of breath with activity. Denies wheezing. Denies chest pain. Patient reports fever of 100 on day 1, but this has resolved. She has tried Alka-seltzer plus and Vicks cough medicine.  Denies history of asthma and COPD. Nonsmoker. Sick contacts include husband and mother-in-law.   Past Medical History:  Diagnosis Date  . Allergy   . Diabetes mellitus 04-09   type 2/ with retinopathy  . Hypertension   . Infertility, female   . Obesity    No past surgical history on file. Social History  Substance Use Topics  . Smoking status: Former Smoker    Types: Cigarettes    Quit date: 05/17/1986  . Smokeless tobacco: Not on file  . Alcohol use Yes     Comment: Rare   family history includes Cancer (age of onset: 37) in her father; Diabetes in her mother and sister; Hypertension in her sister.  ROS: negative except as noted in the HPI  Medications: Current Outpatient Prescriptions  Medication Sig Dispense Refill  . albuterol (PROVENTIL HFA;VENTOLIN HFA) 108 (90 Base) MCG/ACT inhaler Inhale 1-2 puffs into the lungs every 6 (six) hours as needed for wheezing or shortness of breath. 1 Inhaler 0  . AMBULATORY NON FORMULARY MEDICATION Medication Name:  One touch ultra test strips 300 strip 1  . aspirin (ASPIRIN CHILDRENS) 81 MG chewable tablet Chew 81 mg by mouth daily.      . benzonatate (TESSALON) 200 MG capsule Take 1 capsule (200 mg total) by mouth 3 (three) times daily as needed for cough. 30 capsule 0  . glipiZIDE (GLUCOTROL XL) 2.5 MG 24 hr tablet TAKE 1 TABLET(2.5 MG) BY MOUTH DAILY WITH BREAKFAST 90 tablet 4  . INVOKAMET [id number] MG TABS TAKE 1 TABLET BY  MOUTH TWICE DAILY 60 tablet 1  . ipratropium (ATROVENT) 0.06 % nasal spray Place 1 spray into both nostrils 4 (four) times daily as needed. 15 mL 0  . Liraglutide (VICTOZA) 18 MG/3ML SOPN Inject 0.4 mLs (2.4 mg total) into the skin daily. 4 pen 6  . METRONIDAZOLE, TOPICAL, 0.75 % LOTN Use bid as directed 3 Bottle 1  . NOVOTWIST 32G X 5 MM MISC USE AS DIRECTED 100 each 11  . nystatin (MYCOSTATIN) powder Apply topically 4 (four) times daily. 60 g 2  . ONE TOUCH ULTRA TEST test strip TEST BLOOD SUGAR TWICE DAILY AS DIRECTED 300 each 0  . ONETOUCH DELICA LANCETS FINE MISC USE TO TEST BLOOD SUGAR TWICE DAILY AS DIRECTED 200 each 0  . simvastatin (ZOCOR) 20 MG tablet Take 1 tablet (20 mg total) by mouth daily at 6 PM. 90 tablet 3  . telmisartan-hydrochlorothiazide (MICARDIS HCT) 40-12.5 MG tablet TAKE 1 TABLET BY MOUTH DAILY 30 tablet 6   No current facility-administered medications for this visit.    Allergies  Allergen Reactions  . Atorvastatin Other (See Comments)    Leg  weakness       Objective:  BP 130/81   Pulse 99   Temp 98.3 F (36.8 C) (Oral)   Wt 277 lb (125.6 kg)   BMI 47.55 kg/m  Gen: well-groomed, cooperative, not ill-appearing, no distress HEENT: normal conjunctiva, TM's clear, nasal mucosa edematous, oropharynx clear, moist mucus membranes, no frontal or maxillary sinus tenderness Pulm: Normal work of breathing, normal phonation, lung sounds different, expiratory wheeze in the left upper field, no rales or rhonchi CV: Normal rate, regular rhythm, s1 and s2 distinct, no murmurs, clicks or rubs  Neuro: alert and oriented x 3, EOM's intact Lymph: no cervical or tonsillar adenopathy Skin: warm and dry, no rashes or lesions on exposed skin, no cyanosis   No results found for this or any previous visit (from the past 72 hour(s)). No results found.    Assessment and Plan: 51 y.o. female with   1. Acute upper respiratory infection - Duoneb given in clinic today for  wheezes - symptomatic management with nasal spray, antihistamine, tessalon and albuterol - if no improvement in 5 days, will obtain CXR - albuterol (PROVENTIL HFA;VENTOLIN HFA) 108 (90 Base) MCG/ACT inhaler; Inhale 1-2 puffs into the lungs every 6 (six) hours as needed for wheezing or shortness of breath.  Dispense: 1 Inhaler; Refill: 0 - benzonatate (TESSALON) 200 MG capsule; Take 1 capsule (200 mg total) by mouth 3 (three) times daily as needed for cough.  Dispense: 30 capsule; Refill: 0 - ipratropium (ATROVENT) 0.06 % nasal spray; Place 1 spray into both nostrils 4 (four) times daily as needed.  Dispense: 15 mL; Refill: 0 - ipratropium-albuterol (DUONEB) 0.5-2.5 (3) MG/3ML nebulizer solution 3 mL; Take 3 mLs by nebulization once.  Patient education and anticipatory guidance given Patient agrees with treatment plan Follow-up as needed if symptoms worsen or fail to improve  Darlyne Russian PA-C

## 2016-08-11 NOTE — Patient Instructions (Addendum)
A FEW NOTES ON OVER-THE-COUNTER COLD MEDICATIONS  USE CAUTION - many OTC medications come in combinations of multiple generic medicines. A clue is anything that has the name "cold" "flu" or "multi-symptom" in the name. ALWAYS READ THE LABELS to make sure you are not taking a cold medicine, which contains medications you are also taking separately or contains medications that you may be allergic to  Remember you can always ask the pharmacist for help in choosing over-the-counter medications  Please continue to take your prescription medications. If you are concerned about a drug interaction or side effect, please call our office  If your symptoms are getting worse despite treatment, please return. For after-hours care, there is someone on-call who can provide advice. For severe illness, go to the emergency room  NASAL SPRAYS: Helps with nasal congestion and ear pressure PRESCRTIPTION ATROVENT - up to 4 times daily as directed on prescription bottle  ANTIHISTAMINES: Helps dry out runny nose and decreases postnasal drip.  Zyrtec 10 mg daily  Tessalon - for cough  Upper Respiratory Infection, Adult Most upper respiratory infections (URIs) are a viral infection of the air passages leading to the lungs. A URI affects the nose, throat, and upper air passages. The most common type of URI is nasopharyngitis and is typically referred to as "the common cold." URIs run their course and usually go away on their own. Most of the time, a URI does not require medical attention, but sometimes a bacterial infection in the upper airways can follow a viral infection. This is called a secondary infection. Sinus and middle ear infections are common types of secondary upper respiratory infections. Bacterial pneumonia can also complicate a URI. A URI can worsen asthma and chronic obstructive pulmonary disease (COPD). Sometimes, these complications can require emergency medical care and may be life threatening. What  are the causes? Almost all URIs are caused by viruses. A virus is a type of germ and can spread from one person to another. What increases the risk? You may be at risk for a URI if:  You smoke.  You have chronic heart or lung disease.  You have a weakened defense (immune) system.  You are very young or very old.  You have nasal allergies or asthma.  You work in crowded or poorly ventilated areas.  You work in health care facilities or schools. What are the signs or symptoms? Symptoms typically develop 2-3 days after you come in contact with a cold virus. Most viral URIs last 7-10 days. However, viral URIs from the influenza virus (flu virus) can last 14-18 days and are typically more severe. Symptoms may include:  Runny or stuffy (congested) nose.  Sneezing.  Cough.  Sore throat.  Headache.  Fatigue.  Fever.  Loss of appetite.  Pain in your forehead, behind your eyes, and over your cheekbones (sinus pain).  Muscle aches. How is this diagnosed? Your health care provider may diagnose a URI by:  Physical exam.  Tests to check that your symptoms are not due to another condition such as:  Strep throat.  Sinusitis.  Pneumonia.  Asthma. How is this treated? A URI goes away on its own with time. It cannot be cured with medicines, but medicines may be prescribed or recommended to relieve symptoms. Medicines may help:  Reduce your fever.  Reduce your cough.  Relieve nasal congestion. Follow these instructions at home:  Take medicines only as directed by your health care provider.  Gargle warm saltwater or take cough drops  to comfort your throat as directed by your health care provider.  Use a warm mist humidifier or inhale steam from a shower to increase air moisture. This may make it easier to breathe.  Drink enough fluid to keep your urine clear or pale yellow.  Eat soups and other clear broths and maintain good nutrition.  Rest as needed.  Return  to work when your temperature has returned to normal or as your health care provider advises. You may need to stay home longer to avoid infecting others. You can also use a face mask and careful hand washing to prevent spread of the virus.  Increase the usage of your inhaler if you have asthma.  Do not use any tobacco products, including cigarettes, chewing tobacco, or electronic cigarettes. If you need help quitting, ask your health care provider. How is this prevented? The best way to protect yourself from getting a cold is to practice good hygiene.  Avoid oral or hand contact with people with cold symptoms.  Wash your hands often if contact occurs. There is no clear evidence that vitamin C, vitamin E, echinacea, or exercise reduces the chance of developing a cold. However, it is always recommended to get plenty of rest, exercise, and practice good nutrition. Contact a health care provider if:  You are getting worse rather than better.  Your symptoms are not controlled by medicine.  You have chills.  You have worsening shortness of breath.  You have brown or red mucus.  You have yellow or brown nasal discharge.  You have pain in your face, especially when you bend forward.  You have a fever.  You have swollen neck glands.  You have pain while swallowing.  You have white areas in the back of your throat. Get help right away if:  You have severe or persistent:  Headache.  Ear pain.  Sinus pain.  Chest pain.  You have chronic lung disease and any of the following:  Wheezing.  Prolonged cough.  Coughing up blood.  A change in your usual mucus.  You have a stiff neck.  You have changes in your:  Vision.  Hearing.  Thinking.  Mood. This information is not intended to replace advice given to you by your health care provider. Make sure you discuss any questions you have with your health care provider. Document Released: 10/27/2000 Document Revised:  01/04/2016 Document Reviewed: 08/08/2013 Elsevier Interactive Patient Education  2017 [employer].

## 2016-08-16 ENCOUNTER — Ambulatory Visit (INDEPENDENT_AMBULATORY_CARE_PROVIDER_SITE_OTHER): Payer: BLUE CROSS/BLUE SHIELD

## 2016-08-16 ENCOUNTER — Ambulatory Visit (INDEPENDENT_AMBULATORY_CARE_PROVIDER_SITE_OTHER): Payer: BLUE CROSS/BLUE SHIELD | Admitting: Physician Assistant

## 2016-08-16 ENCOUNTER — Other Ambulatory Visit: Payer: Self-pay

## 2016-08-16 ENCOUNTER — Other Ambulatory Visit: Payer: Self-pay | Admitting: Physician Assistant

## 2016-08-16 VITALS — BP 136/85 | HR 86 | Temp 98.1°F | Wt 276.0 lb

## 2016-08-16 DIAGNOSIS — J3089 Other allergic rhinitis: Secondary | ICD-10-CM | POA: Diagnosis not present

## 2016-08-16 DIAGNOSIS — Z87891 Personal history of nicotine dependence: Secondary | ICD-10-CM | POA: Diagnosis not present

## 2016-08-16 DIAGNOSIS — R0609 Other forms of dyspnea: Secondary | ICD-10-CM | POA: Diagnosis not present

## 2016-08-16 DIAGNOSIS — J069 Acute upper respiratory infection, unspecified: Secondary | ICD-10-CM

## 2016-08-16 DIAGNOSIS — R0689 Other abnormalities of breathing: Secondary | ICD-10-CM | POA: Insufficient documentation

## 2016-08-16 DIAGNOSIS — R0600 Dyspnea, unspecified: Secondary | ICD-10-CM

## 2016-08-16 DIAGNOSIS — R0602 Shortness of breath: Secondary | ICD-10-CM

## 2016-08-16 DIAGNOSIS — R05 Cough: Secondary | ICD-10-CM | POA: Diagnosis not present

## 2016-08-16 MED ORDER — MONTELUKAST SODIUM 10 MG PO TABS: 10.0000 mg | ORAL_TABLET | Freq: Every day | ORAL | 3 refills | Status: DC

## 2016-08-16 MED ORDER — AZITHROMYCIN 250 MG PO TABS: ORAL_TABLET | ORAL | 0 refills | Status: DC

## 2016-08-16 NOTE — Progress Notes (Signed)
HPI:                                                                Laurie Hoffman is a 51 y.o. female who presents to [facility] [place] today for acute URI  Patient with PMH of DM II, HTN, and allergic rhinitis reports worsening nasal congestion, cough, and dyspnea on exertion. Symptoms have been present for approx. 9 days. Cough is still nonproductive. Denies wheezing. Denies chest pain. She is taking Atrovent nasal spray, Tessalon, and Albuterol inhaler. Denies history of asthma and COPD. Nonsmoker. Sick contacts include husband and mother-in-law.   Past Medical History:  Diagnosis Date  . Allergy   . Diabetes mellitus 04-09   type 2/ with retinopathy  . Hypertension   . Infertility, female   . Obesity    No past surgical history on file. Social History  Substance Use Topics  . Smoking status: Former Smoker    Types: Cigarettes    Quit date: 05/17/1986  . Smokeless tobacco: Not on file  . Alcohol use Yes     Comment: Rare   family history includes Cancer (age of onset: 86) in her father; Diabetes in her mother and sister; Hypertension in her sister.  ROS: negative except as noted in the HPI  Medications: Current Outpatient Prescriptions  Medication Sig Dispense Refill  . albuterol (PROVENTIL HFA;VENTOLIN HFA) 108 (90 Base) MCG/ACT inhaler Inhale 1-2 puffs into the lungs every 6 (six) hours as needed for wheezing or shortness of breath. 1 Inhaler 0  . AMBULATORY NON FORMULARY MEDICATION Medication Name:  One touch ultra test strips 300 strip 1  . aspirin (ASPIRIN CHILDRENS) 81 MG chewable tablet Chew 81 mg by mouth daily.      . benzonatate (TESSALON) 200 MG capsule Take 1 capsule (200 mg total) by mouth 3 (three) times daily as needed for cough. 30 capsule 0  . glipiZIDE (GLUCOTROL XL) 2.5 MG 24 hr tablet TAKE 1 TABLET(2.5 MG) BY MOUTH DAILY WITH BREAKFAST 90 tablet 4  . INVOKAMET [phone] MG TABS TAKE 1 TABLET BY MOUTH TWICE DAILY 60  tablet 1  . ipratropium (ATROVENT) 0.06 % nasal spray Place 1 spray into both nostrils 4 (four) times daily as needed. 15 mL 0  . Liraglutide (VICTOZA) 18 MG/3ML SOPN Inject 0.4 mLs (2.4 mg total) into the skin daily. 4 pen 6  . METRONIDAZOLE, TOPICAL, 0.75 % LOTN Use bid as directed 3 Bottle 1  . NOVOTWIST 32G X 5 MM MISC USE AS DIRECTED 100 each 11  . nystatin (MYCOSTATIN) powder Apply topically 4 (four) times daily. 60 g 2  . ONE TOUCH ULTRA TEST test strip TEST BLOOD SUGAR TWICE DAILY AS DIRECTED 300 each 0  . ONETOUCH DELICA LANCETS FINE MISC USE TO TEST BLOOD SUGAR TWICE DAILY AS DIRECTED 200 each 0  . simvastatin (ZOCOR) 20 MG tablet Take 1 tablet (20 mg total) by mouth daily at 6 PM. 90 tablet 3  . telmisartan-hydrochlorothiazide (MICARDIS HCT) 40-12.5 MG tablet TAKE 1 TABLET BY MOUTH DAILY 30 tablet 6   No current facility-administered medications for this visit.    Allergies  Allergen Reactions  . Atorvastatin Other (See Comments)    Leg weakness  Objective:  BP 136/85   Pulse 86   Temp 98.1 F (36.7 C) (Oral)   Wt 276 lb (125.2 kg)   SpO2 94%   BMI 47.38 kg/m  Gen: well-groomed, cooperative, not ill-appearing, no distress HEENT: normal conjunctiva, wearing glasses, nasal mucosa edematous, rhinorrhea present, oropharynx clear, moist mucus membranes, no frontal or maxillary sinus tenderness Pulm: Normal work of breathing, normal phonation, lung sounds distant, no wheezes, rales or rhonchi CV: Normal rate, regular rhythm, s1 and s2 distinct, no murmurs, clicks or rubs  Neuro: alert and oriented x 3, EOM's intact Lymph: no cervical or tonsillar adenopathy Skin: warm and dry, no rashes or lesions on exposed skin, no cyanosis   No results found for this or any previous visit (from the past 72 hour(s)). No results found.    Assessment and Plan: 51 y.o. female with   1. Dyspnea on exertion - DG Chest 2 View; Future - spirometry in 2 weeks - follow-up with  PCP  2. Acute upper respiratory infection - azithromycin (ZITHROMAX Z-PAK) 250 MG tablet; Take 2 tablets (500 mg) on  Day 1,  followed by 1 tablet (250 mg) once daily on Days 2 through 5.  Dispense: 6 tablet; Refill: 0 - cont symptomatic management  3. Chronic non-seasonal allergic rhinitis, unspecified trigger - montelukast (SINGULAIR) 10 MG tablet; Take 1 tablet (10 mg total) by mouth at bedtime.  Dispense: 90 tablet; Refill: 3 - cont daily Zyrtec  Patient education and anticipatory guidance given Patient agrees with treatment plan Follow-up with PCP in 2 weeks or sooner as needed if symptoms worsen or fail to improve  Darlyne Russian PA-C

## 2016-08-16 NOTE — Patient Instructions (Addendum)
Go downstairs for chest xray  Start taking Singulair with your Zyrtec daily for allergies  Make an appointment for Spirometry (lung function testing) Follow-up with Dr. Joellyn Quails in 2-3 weeks for shortness of breath    Shortness of Breath, Adult Shortness of breath is when a person has trouble breathing enough air, or when a person feels like she or he is having trouble breathing in enough air. Shortness of breath could be a sign of medical problem. Follow these instructions at home: Pay attention to any changes in your symptoms. Take these actions to help with your condition:  Do not smoke. Smoking is a common cause of shortness of breath. If you smoke and you need help quitting, ask your health care provider.  Avoid things that can irritate your airways, such as:  Mold.  Dust.  Air pollution.  Chemical fumes.  Things that can cause allergy symptoms (allergens), if you have allergies.  Keep your living space clean and free of mold and dust.  Rest as needed. Slowly return to your usual activities.  Take over-the-counter and prescription medicines, including oxygen and inhaled medicines, only as told by your health care provider.  Keep all follow-up visits as told by your health care provider. This is important. Contact a health care provider if:  Your condition does not improve as soon as expected.  You have a hard time doing your normal activities, even after you rest.  You have new symptoms. Get help right away if:  Your shortness of breath gets worse.  You have shortness of breath when you are resting.  You feel light-headed or you faint.  You have a cough that is not controlled with medicines.  You cough up blood.  You have pain with breathing.  You have pain in your chest, arms, shoulders, or abdomen.  You have a fever.  You cannot walk up stairs or exercise the way that you normally do. This information is not intended to replace advice given to you  by your health care provider. Make sure you discuss any questions you have with your health care provider. Document Released: 01/26/2001 Document Revised: 11/22/2015 Document Reviewed: 10/09/2015 Elsevier Interactive Patient Education  2017 [employer].

## 2016-08-31 ENCOUNTER — Telehealth: Payer: Self-pay | Admitting: *Deleted

## 2016-08-31 NOTE — Telephone Encounter (Signed)
I keep receiving PA requests for patient's victoza. Patient needs a f/u appt. Has not been seen for DM since 9/17. I have called once already and left a vm to call to schedule DM appt. Called today and left vm to please schedule a f/u appt specifically for DM so there will not be any interruption in medication regimen.

## 2016-09-06 ENCOUNTER — Encounter: Payer: Self-pay | Admitting: Family Medicine

## 2016-09-06 ENCOUNTER — Ambulatory Visit (INDEPENDENT_AMBULATORY_CARE_PROVIDER_SITE_OTHER): Payer: BLUE CROSS/BLUE SHIELD | Admitting: Family Medicine

## 2016-09-06 VITALS — BP 111/67 | HR 89 | Ht 64.0 in | Wt 275.0 lb

## 2016-09-06 DIAGNOSIS — I10 Essential (primary) hypertension: Secondary | ICD-10-CM

## 2016-09-06 DIAGNOSIS — Z1231 Encounter for screening mammogram for malignant neoplasm of breast: Secondary | ICD-10-CM | POA: Diagnosis not present

## 2016-09-06 DIAGNOSIS — R0609 Other forms of dyspnea: Secondary | ICD-10-CM

## 2016-09-06 DIAGNOSIS — E119 Type 2 diabetes mellitus without complications: Secondary | ICD-10-CM | POA: Diagnosis not present

## 2016-09-06 DIAGNOSIS — E11319 Type 2 diabetes mellitus with unspecified diabetic retinopathy without macular edema: Secondary | ICD-10-CM

## 2016-09-06 DIAGNOSIS — R0600 Dyspnea, unspecified: Secondary | ICD-10-CM

## 2016-09-06 LAB — POCT UA - MICROALBUMIN
Albumin/Creatinine Ratio, Urine, POC: <30
CREATININE, POC: 50 mg/dL
MICROALBUMIN (UR) POC: 10 mg/L

## 2016-09-06 LAB — POCT GLYCOSYLATED HEMOGLOBIN (HGB A1C): HEMOGLOBIN A1C: 8

## 2016-09-06 NOTE — Progress Notes (Signed)
Subjective:    Patient ID: Laurie Hoffman, female    DOB: Mar 04, 1966, 51 y.o.   MRN: [id number]  HPI  Diabetes - no hypoglycemic events. No wounds or sores that are not healing well. No increased thirst or urination. Checking glucose at home. Taking medications as prescribed without any side effects.  Hypertension- Pt denies chest pain, SOB, dizziness, or heart palpitations.  Taking meds as directed w/o problems.  Denies medication side effects.    Diabetic retinopathy-she has jumped her eye exam for next month.  She was seen in our office in early April and also in March for acute upper respiratory infection but was also having some dyspnea on exertion. She says she always feels like she can't get a deep breath even when she is not sick. Her sister and aunt both have asthma. They have discussed getting spirometry.  Review of Systems  BP 111/67   Pulse 89   Ht 5\' 4"  (1.626 m)   Wt 275 lb (124.7 kg)   SpO2 99%   BMI 47.20 kg/m     Allergies  Allergen Reactions  . Atorvastatin Other (See Comments)    Leg weakness    Past Medical History:  Diagnosis Date  . Allergy   . Diabetes mellitus 04-09   type 2/ with retinopathy  . Hypertension   . Infertility, female   . Obesity     No past surgical history on file.  Social History   Social History  . Marital status: Married    Spouse name: N/A  . Number of children: N/A  . Years of education: N/A   Occupational History  . Not on file.   Social History Main Topics  . Smoking status: Former Smoker    Types: Cigarettes    Quit date: 05/17/1986  . Smokeless tobacco: Never Used  . Alcohol use Yes     Comment: Rare  . Drug use: No  . Sexual activity: Not on file   Other Topics Concern  . Not on file   Social History Narrative  . No narrative on file    Family History  Problem Relation Age of Onset  . Diabetes Mother     Insulin  . Cancer Father 63    melanoma  . Diabetes Sister     Insulin  . Hypertension  Sister     Outpatient Encounter Prescriptions as of 09/06/2016  Medication Sig  . albuterol (PROVENTIL HFA;VENTOLIN HFA) 108 (90 Base) MCG/ACT inhaler Inhale 1-2 puffs into the lungs every 6 (six) hours as needed for wheezing or shortness of breath.  . AMBULATORY NON FORMULARY MEDICATION Medication Name:  One touch ultra test strips  . aspirin (ASPIRIN CHILDRENS) 81 MG chewable tablet Chew 81 mg by mouth daily.    Marland Kitchen glipiZIDE (GLUCOTROL XL) 2.5 MG 24 hr tablet TAKE 1 TABLET(2.5 MG) BY MOUTH DAILY WITH BREAKFAST  . INVOKAMET [phone] MG TABS TAKE 1 TABLET BY MOUTH TWICE DAILY  . Liraglutide (VICTOZA) 18 MG/3ML SOPN Inject 0.4 mLs (2.4 mg total) into the skin daily.  Marland Kitchen METRONIDAZOLE, TOPICAL, 0.75 % LOTN Use bid as directed  . montelukast (SINGULAIR) 10 MG tablet Take 1 tablet (10 mg total) by mouth at bedtime.  Marland Kitchen NOVOTWIST 32G X 5 MM MISC USE AS DIRECTED  . nystatin (MYCOSTATIN) powder Apply topically 4 (four) times daily.  . ONE TOUCH ULTRA TEST test strip TEST BLOOD SUGAR TWICE DAILY AS DIRECTED  . ONETOUCH DELICA LANCETS FINE MISC USE TO TEST BLOOD  SUGAR TWICE DAILY AS DIRECTED  . simvastatin (ZOCOR) 20 MG tablet Take 1 tablet (20 mg total) by mouth daily at 6 PM.  . telmisartan-hydrochlorothiazide (MICARDIS HCT) 40-12.5 MG tablet TAKE 1 TABLET BY MOUTH DAILY  . [DISCONTINUED] azithromycin (ZITHROMAX Z-PAK) 250 MG tablet Take 2 tablets (500 mg) on  Day 1,  followed by 1 tablet (250 mg) once daily on Days 2 through 5.  . [DISCONTINUED] azithromycin (ZITHROMAX) 250 MG tablet TAKE 2 TABLETS BY MOUTH ON DAY 1, FOLLOWED BY 1 TABLET ONCE DAILY ON DAYS 2 THROUGH 5  . [DISCONTINUED] benzonatate (TESSALON) 200 MG capsule Take 1 capsule (200 mg total) by mouth 3 (three) times daily as needed for cough.  . [DISCONTINUED] ipratropium (ATROVENT) 0.06 % nasal spray Place 1 spray into both nostrils 4 (four) times daily as needed.   No facility-administered encounter medications on file as of 09/06/2016.           Objective:   Physical Exam  Constitutional: She is oriented to person, place, and time. She appears well-developed and well-nourished.  HENT:  Head: Normocephalic and atraumatic.  Cardiovascular: Normal rate, regular rhythm and normal heart sounds.   Pulmonary/Chest: Effort normal and breath sounds normal.  Neurological: She is alert and oriented to person, place, and time.  Skin: Skin is warm and dry.  Psychiatric: She has a normal mood and affect. Her behavior is normal.          Assessment & Plan:  DM- Uncontrolled. Hemoglobin A1c up to 8.0, it was previously 6.66 months ago. We discussed getting back on track. Evidently she had not refilled her glipizide and did not realize it until recently. She has picked up the prescription and plans to start it. She has been using the 2.4 mg the Victoza daily. She has not been exercising so we discussed a strategy around becoming more active and getting out on these nice spring days.  Diabetic retinopathy-has eye exam scheduled next month.  HTN - well controlled. Continue current regimen.  Shortness of breath-recommend spirometry. She can schedule her convenience.  Due for breast screening-she plans on scheduling her mammogram today.

## 2016-09-08 ENCOUNTER — Encounter: Payer: Self-pay | Admitting: Family Medicine

## 2016-09-08 LAB — LIPID PANEL
Cholesterol: 108 mg/dL (ref 0–200)
HDL: 46 mg/dL (ref 35–70)
LDL Cholesterol: 44 mg/dL
TRIGLYCERIDES: 91 mg/dL (ref 40–160)

## 2016-09-08 LAB — HEMOGLOBIN A1C: Hemoglobin A1C: 7.6

## 2016-09-08 LAB — BASIC METABOLIC PANEL: Glucose: 191 mg/dL

## 2016-09-09 ENCOUNTER — Encounter: Payer: Self-pay | Admitting: Family Medicine

## 2016-09-13 DIAGNOSIS — I10 Essential (primary) hypertension: Secondary | ICD-10-CM | POA: Diagnosis not present

## 2016-09-13 DIAGNOSIS — E119 Type 2 diabetes mellitus without complications: Secondary | ICD-10-CM | POA: Diagnosis not present

## 2016-09-13 LAB — COMPLETE METABOLIC PANEL WITH GFR
ALBUMIN: 3.9 g/dL (ref 3.6–5.1)
ALK PHOS: 80 U/L (ref 33–115)
ALT: 20 U/L (ref 6–29)
AST: 16 U/L (ref 10–35)
BUN: 18 mg/dL (ref 7–25)
CALCIUM: 9.6 mg/dL (ref 8.6–10.2)
CO2: 20 mmol/L (ref 20–31)
CREATININE: 0.9 mg/dL (ref 0.50–1.10)
Chloride: 103 mmol/L (ref 98–110)
GFR, EST AFRICAN AMERICAN: 87 mL/min (ref >=60)
GFR, Est Non African American: 75 mL/min (ref >=60)
Glucose, Bld: 129 mg/dL — ABNORMAL HIGH (ref 65–99)
POTASSIUM: 4.9 mmol/L (ref 3.5–5.3)
Sodium: 141 mmol/L (ref 135–146)
Total Bilirubin: 0.3 mg/dL (ref 0.2–1.2)
Total Protein: 7.2 g/dL (ref 6.1–8.1)

## 2016-09-13 LAB — LIPID PANEL
CHOLESTEROL: 132 mg/dL (ref <200)
HDL: 48 mg/dL — ABNORMAL LOW (ref >50)
LDL CALC: 64 mg/dL (ref <100)
Total CHOL/HDL Ratio: 2.8 Ratio (ref <5.0)
Triglycerides: 98 mg/dL (ref <150)
VLDL: 20 mg/dL (ref <30)

## 2016-09-14 NOTE — Progress Notes (Signed)
All labs are normal. 

## 2016-09-24 ENCOUNTER — Ambulatory Visit (INDEPENDENT_AMBULATORY_CARE_PROVIDER_SITE_OTHER): Payer: BLUE CROSS/BLUE SHIELD

## 2016-09-24 DIAGNOSIS — Z1231 Encounter for screening mammogram for malignant neoplasm of breast: Secondary | ICD-10-CM | POA: Diagnosis not present

## 2016-09-29 ENCOUNTER — Other Ambulatory Visit: Payer: Self-pay | Admitting: Family Medicine

## 2016-10-12 ENCOUNTER — Ambulatory Visit (INDEPENDENT_AMBULATORY_CARE_PROVIDER_SITE_OTHER): Payer: BLUE CROSS/BLUE SHIELD | Admitting: Family Medicine

## 2016-10-12 VITALS — BP 136/82 | HR 87 | Ht 64.0 in | Wt 280.0 lb

## 2016-10-12 DIAGNOSIS — J984 Other disorders of lung: Secondary | ICD-10-CM | POA: Diagnosis not present

## 2016-10-12 DIAGNOSIS — R0602 Shortness of breath: Secondary | ICD-10-CM

## 2016-10-12 MED ORDER — ALBUTEROL SULFATE (2.5 MG/3ML) 0.083% IN NEBU
2.5000 mg | INHALATION_SOLUTION | Freq: Once | RESPIRATORY_TRACT | Status: AC
  Administered 2016-10-12: 2.5 mg via RESPIRATORY_TRACT

## 2016-10-12 NOTE — Progress Notes (Signed)
Subjective:    Patient ID: Laurie Hoffman, female    DOB: 28-Feb-1966, 51 y.o.   MRN: [id number]  HPI 51 year old female is here today for spirometry. When I was following up for diabetes about 4 weeks ago she had mentioned that she felt like it times that she was having some difficulty getting a deep breath in even when she was not sick. Back in April she had had an acute upper respiratory infection. Interestingly both her sister and aunt had been previously diagnosed with asthma.   Review of Systems  BP 136/82   Pulse 87   Ht 5\' 4"  (1.626 m)   Wt 280 lb (127 kg)   SpO2 98%   BMI 48.06 kg/m     Allergies  Allergen Reactions  . Atorvastatin Other (See Comments)    Leg weakness    Past Medical History:  Diagnosis Date  . Allergy   . Diabetes mellitus 04-09   type 2/ with retinopathy  . Hypertension   . Infertility, female   . Obesity     No past surgical history on file.  Social History   Social History  . Marital status: Married    Spouse name: N/A  . Number of children: N/A  . Years of education: N/A   Occupational History  . Not on file.   Social History Main Topics  . Smoking status: Former Smoker    Types: Cigarettes    Quit date: 05/17/1986  . Smokeless tobacco: Never Used  . Alcohol use Yes     Comment: Rare  . Drug use: No  . Sexual activity: Not on file   Other Topics Concern  . Not on file   Social History Narrative  . No narrative on file    Family History  Problem Relation Age of Onset  . Diabetes Mother        Insulin  . Cancer Father 61       melanoma  . Diabetes Sister        Insulin  . Hypertension Sister     Outpatient Encounter Prescriptions as of 10/12/2016  Medication Sig  . albuterol (PROVENTIL HFA;VENTOLIN HFA) 108 (90 Base) MCG/ACT inhaler Inhale 1-2 puffs into the lungs every 6 (six) hours as needed for wheezing or shortness of breath.  . AMBULATORY NON FORMULARY MEDICATION Medication Name:  One touch ultra test strips   . aspirin (ASPIRIN CHILDRENS) 81 MG chewable tablet Chew 81 mg by mouth daily.    Marland Kitchen glipiZIDE (GLUCOTROL XL) 2.5 MG 24 hr tablet TAKE 1 TABLET(2.5 MG) BY MOUTH DAILY WITH BREAKFAST  . INVOKAMET [id number] MG TABS TAKE 1 TABLET BY MOUTH TWICE DAILY  . Liraglutide (VICTOZA) 18 MG/3ML SOPN Inject 0.4 mLs (2.4 mg total) into the skin daily.  Marland Kitchen METRONIDAZOLE, TOPICAL, 0.75 % LOTN Use bid as directed  . montelukast (SINGULAIR) 10 MG tablet Take 1 tablet (10 mg total) by mouth at bedtime.  Marland Kitchen NOVOTWIST 32G X 5 MM MISC USE AS DIRECTED  . nystatin (MYCOSTATIN) powder Apply topically 4 (four) times daily.  . ONE TOUCH ULTRA TEST test strip TEST BLOOD SUGAR TWICE DAILY AS DIRECTED  . ONETOUCH DELICA LANCETS FINE MISC USE TO TEST BLOOD SUGAR TWICE DAILY AS DIRECTED  . simvastatin (ZOCOR) 20 MG tablet Take 1 tablet (20 mg total) by mouth daily at 6 PM.  . telmisartan-hydrochlorothiazide (MICARDIS HCT) 40-12.5 MG tablet TAKE 1 TABLET BY MOUTH DAILY  . [EXPIRED] albuterol (PROVENTIL) (2.5 MG/3ML) 0.083% nebulizer solution  2.5 mg    No facility-administered encounter medications on file as of 10/12/2016.           Objective:   Physical Exam  Constitutional: She is oriented to person, place, and time. She appears well-developed and well-nourished.  HENT:  Head: Normocephalic and atraumatic.  Cardiovascular: Normal rate, regular rhythm and normal heart sounds.   Pulmonary/Chest: Effort normal and breath sounds normal.  Neurological: She is alert and oriented to person, place, and time.  Skin: Skin is warm and dry.  Psychiatric: She has a normal mood and affect. Her behavior is normal.        Assessment & Plan:  Shortness of breath-spirometry today shows restriction. FVC of 69% FEV1 of 68% with a ratio of 78%. She did have no significant improvement in FEV1 after albuterol, though did have 9% improvement. Not diagnostic for asthma at this point. Recommend referral to pulmonology for further  evaluation.

## 2016-10-28 ENCOUNTER — Other Ambulatory Visit: Payer: Self-pay | Admitting: Family Medicine

## 2016-10-31 ENCOUNTER — Other Ambulatory Visit: Payer: Self-pay | Admitting: Family Medicine

## 2016-10-31 DIAGNOSIS — E119 Type 2 diabetes mellitus without complications: Secondary | ICD-10-CM

## 2016-11-23 ENCOUNTER — Other Ambulatory Visit: Payer: Self-pay | Admitting: Family Medicine

## 2016-12-01 DIAGNOSIS — E119 Type 2 diabetes mellitus without complications: Secondary | ICD-10-CM | POA: Diagnosis not present

## 2016-12-01 DIAGNOSIS — H5213 Myopia, bilateral: Secondary | ICD-10-CM | POA: Diagnosis not present

## 2016-12-01 LAB — HM DIABETES EYE EXAM

## 2016-12-02 ENCOUNTER — Encounter: Payer: Self-pay | Admitting: Family Medicine

## 2016-12-06 ENCOUNTER — Encounter: Payer: Self-pay | Admitting: Family Medicine

## 2016-12-06 ENCOUNTER — Ambulatory Visit (INDEPENDENT_AMBULATORY_CARE_PROVIDER_SITE_OTHER): Payer: BLUE CROSS/BLUE SHIELD | Admitting: Family Medicine

## 2016-12-06 VITALS — BP 113/57 | HR 67 | Ht 64.0 in | Wt 283.0 lb

## 2016-12-06 DIAGNOSIS — E11319 Type 2 diabetes mellitus with unspecified diabetic retinopathy without macular edema: Secondary | ICD-10-CM | POA: Diagnosis not present

## 2016-12-06 DIAGNOSIS — H04123 Dry eye syndrome of bilateral lacrimal glands: Secondary | ICD-10-CM | POA: Diagnosis not present

## 2016-12-06 DIAGNOSIS — E119 Type 2 diabetes mellitus without complications: Secondary | ICD-10-CM

## 2016-12-06 DIAGNOSIS — I10 Essential (primary) hypertension: Secondary | ICD-10-CM

## 2016-12-06 LAB — POCT GLYCOSYLATED HEMOGLOBIN (HGB A1C): Hemoglobin A1C: 7.2

## 2016-12-06 MED ORDER — GLIPIZIDE ER 2.5 MG PO TB24: ORAL_TABLET | ORAL | 3 refills | Status: DC

## 2016-12-06 MED ORDER — SIMVASTATIN 20 MG PO TABS: 20.0000 mg | ORAL_TABLET | Freq: Every day | ORAL | 3 refills | Status: DC

## 2016-12-06 MED ORDER — TELMISARTAN-HCTZ 40-12.5 MG PO TABS
1.0000 | ORAL_TABLET | Freq: Every day | ORAL | 3 refills | Status: DC
Start: 2016-12-06 — End: 2017-11-13

## 2016-12-06 NOTE — Progress Notes (Signed)
Subjective:    Patient ID: Laurie Hoffman, female    DOB: 05/24/1965, 51 y.o.   MRN: [id number]  HPI Diabetes - no hypoglycemic events. No wounds or sores that are not healing well. No increased thirst or urination. Checking glucose at home. Taking medications as prescribed without any side effects.She says she just had her eye exam done last week. Will call for report.  Hypertension- Pt denies chest pain, SOB, dizziness, or heart palpitations.  Taking meds as directed w/o problems.  Denies medication side effects.    Diabetic retinopathy - she just had her eye exam at [facility] last week. In fact she had been complaining of some itchy eyes and they diagnosed her with dry eye and have placed her on some new eyedrops. They should be sending it to [place] to get approval.    Review of Systems  BP (!) 113/57   Pulse 67   Wt 283 lb (128.4 kg)   BMI 48.58 kg/m     Allergies  Allergen Reactions  . Atorvastatin Other (See Comments)    Leg weakness    Past Medical History:  Diagnosis Date  . Allergy   . Diabetes mellitus 04-09   type 2/ with retinopathy  . Hypertension   . Infertility, female   . Obesity     No past surgical history on file.  Social History   Social History  . Marital status: Married    Spouse name: N/A  . Number of children: N/A  . Years of education: N/A   Occupational History  . Not on file.   Social History Main Topics  . Smoking status: Former Smoker    Types: Cigarettes    Quit date: 05/17/1986  . Smokeless tobacco: Never Used  . Alcohol use Yes     Comment: Rare  . Drug use: No  . Sexual activity: Not on file   Other Topics Concern  . Not on file   Social History Narrative  . No narrative on file    Family History  Problem Relation Age of Onset  . Diabetes Mother        Insulin  . Cancer Father 69       melanoma  . Diabetes Sister        Insulin  . Hypertension Sister     Outpatient Encounter Prescriptions as of 12/06/2016   Medication Sig  . albuterol (PROVENTIL HFA;VENTOLIN HFA) 108 (90 Base) MCG/ACT inhaler Inhale 1-2 puffs into the lungs every 6 (six) hours as needed for wheezing or shortness of breath.  . AMBULATORY NON FORMULARY MEDICATION Medication Name:  One touch ultra test strips  . aspirin (ASPIRIN CHILDRENS) 81 MG chewable tablet Chew 81 mg by mouth daily.    Marland Kitchen glipiZIDE (GLUCOTROL XL) 2.5 MG 24 hr tablet TAKE 1 TABLET(2.5 MG) BY MOUTH DAILY WITH BREAKFAST  . INVOKAMET [phone] MG TABS TAKE 1 TABLET BY MOUTH TWICE DAILY  . METRONIDAZOLE, TOPICAL, 0.75 % LOTN Use bid as directed  . montelukast (SINGULAIR) 10 MG tablet Take 1 tablet (10 mg total) by mouth at bedtime.  Marland Kitchen NOVOTWIST 32G X 5 MM MISC USE AS DIRECTED  . nystatin (MYCOSTATIN) powder Apply topically 4 (four) times daily.  . ONE TOUCH ULTRA TEST test strip TEST BLOOD SUGAR TWICE DAILY AS DIRECTED  . ONETOUCH DELICA LANCETS FINE MISC USE TO TEST BLOOD SUGAR TWICE DAILY AS DIRECTED  . simvastatin (ZOCOR) 20 MG tablet Take 1 tablet (20 mg total) by mouth daily  at 6 PM.  . telmisartan-hydrochlorothiazide (MICARDIS HCT) 40-12.5 MG tablet Take 1 tablet by mouth daily.  Marland Kitchen VICTOZA 18 MG/3ML SOPN ADMINISTER 2.4 MG UNDER THE SKIN DAILY  . [DISCONTINUED] glipiZIDE (GLUCOTROL XL) 2.5 MG 24 hr tablet TAKE 1 TABLET(2.5 MG) BY MOUTH DAILY WITH BREAKFAST  . [DISCONTINUED] simvastatin (ZOCOR) 20 MG tablet Take 1 tablet (20 mg total) by mouth daily at 6 PM.  . [DISCONTINUED] telmisartan-hydrochlorothiazide (MICARDIS HCT) 40-12.5 MG tablet Take 1 tablet by mouth daily. Due for follow up visit   No facility-administered encounter medications on file as of 12/06/2016.          Objective:   Physical Exam  Constitutional: She is oriented to person, place, and time. She appears well-developed and well-nourished.  HENT:  Head: Normocephalic and atraumatic.  Cardiovascular: Normal rate, regular rhythm and normal heart sounds.   Pulmonary/Chest: Effort normal  and breath sounds normal.  Neurological: She is alert and oriented to person, place, and time.  Skin: Skin is warm and dry.  Psychiatric: She has a normal mood and affect. Her behavior is normal.       Assessment & Plan:  DM- A1C down to 7.2.Great work. Continue work on [employer] and regular exercise. No changes to medication. She says most of the changes that she made were just over the last 6 weeks so she can do that consistently for 3 months and I think she will be able to get her A1c under 7. F/U in 3 months.   HTN - Well controlled. Continue current regimen. Follow up in  3-4 months.   Diabetic retinopathy - all call to get copy of eye report from last week.  Dry eye-will call and get medication approved for eyedrops.

## 2016-12-17 ENCOUNTER — Encounter: Payer: Self-pay | Admitting: Family Medicine

## 2016-12-23 ENCOUNTER — Encounter: Payer: Self-pay | Admitting: Pulmonary Disease

## 2016-12-23 ENCOUNTER — Ambulatory Visit (INDEPENDENT_AMBULATORY_CARE_PROVIDER_SITE_OTHER): Payer: BLUE CROSS/BLUE SHIELD | Admitting: Pulmonary Disease

## 2016-12-23 VITALS — BP 116/79 | HR 107 | Ht 62.0 in | Wt 283.0 lb

## 2016-12-23 DIAGNOSIS — R0600 Dyspnea, unspecified: Secondary | ICD-10-CM | POA: Diagnosis not present

## 2016-12-23 DIAGNOSIS — R0689 Other abnormalities of breathing: Secondary | ICD-10-CM | POA: Diagnosis not present

## 2016-12-23 NOTE — Progress Notes (Signed)
Subjective:    Patient ID: Laurie Hoffman, female    DOB: 03-20-1966, 51 y.o.   MRN: [id number]  HPI   Chief Complaint  Patient presents with  . Pulm Consult    Referred by Dr. Madilyn Fireman for SOB. Had a PFT done back in May 2018 due to symptoms.     51 year old remote smoker referred for evaluation of abnormal PFTs. She developed shortness of breath and allergy type symptoms in April 2018 and had transient drop in her oxygen level. She reports similar allergies in the spring and fall. She was treated with Zyrtec, Flonase and Singulair and albuterol inhaler with improvement in her symptoms Spirometry was performed 10/12/16 which showed moderate restriction with ratio 76, FEV1 of 68% FVC of 69%. There was 9% improvement with bronchodilator. She denies wheezing or childhood history of asthma. She smoked less than 5 pack years until she quit in 1994. She has not really had to use albuterol much in the last 3 months. She denies nocturnal wheezing or other symptoms.  She admits to sit entry lifestyle, works as an Surveyor, minerals and has gained about 10 pounds in the last 2 years to her current weight of 283 pounds.  She denies obvious choking or gasping episodes in her sleep, husband has reported mild snoring. Her sister has sleep apnea and uses CPAP machine  She has type 2 diabetes and hypertension. No cardiac issues and denies orthopnea, paroxysmal nocturnal dyspnea, chest pain  On ambulation, her oxygen level dropped from 96-91% and heart rate increased from resting 107 to 125/m  Chest x-ray 08/2016 did not show any infiltrates or effusions  Past Medical History:  Diagnosis Date  . Allergy   . Diabetes mellitus 04-09   type 2/ with retinopathy  . Hypertension   . Infertility, female   . Obesity    No past surgical history on file. Allergies  Allergen Reactions  . Atorvastatin Other (See Comments)    Leg weakness     Social History   Social History  . Marital status: Married      Spouse name: N/A  . Number of children: N/A  . Years of education: N/A   Occupational History  . Not on file.   Social History Main Topics  . Smoking status: Former Smoker    Types: Cigarettes    Quit date: 05/17/1986  . Smokeless tobacco: Never Used  . Alcohol use Yes     Comment: Rare  . Drug use: No  . Sexual activity: Not on file   Other Topics Concern  . Not on file   Social History Narrative  . No narrative on file      Family History  Problem Relation Age of Onset  . Diabetes Mother        Insulin  . Cancer Father 98       melanoma  . Diabetes Sister        Insulin  . Hypertension Sister       Review of Systems Positive for seasonal nasal congestion and shortness of breath with activity  Constitutional: negative for anorexia, fevers and sweats  Eyes: negative for irritation, redness and visual disturbance  Ears, nose, mouth, throat, and face: negative for earaches, epistaxis, nasal congestion and sore throat  Respiratory: negative for cough,  sputum and wheezing  Cardiovascular: negative for chest pain,lower extremity edema, orthopnea, palpitations and syncope  Gastrointestinal: negative for abdominal pain, constipation, diarrhea, melena, nausea and vomiting  Genitourinary:negative for dysuria, frequency and  hematuria  Hematologic/lymphatic: negative for bleeding, easy bruising and lymphadenopathy  Musculoskeletal:negative for arthralgias, muscle weakness and stiff joints  Neurological: negative for coordination problems, gait problems, headaches and weakness  Endocrine: negative for diabetic symptoms including polydipsia, polyuria and weight loss     Objective:   Physical Exam  Gen. Pleasant, obese, in no distress, normal affect ENT - no lesions, no post nasal drip, class 2 airway Neck: No JVD, no thyromegaly, no carotid bruits Lungs: no use of accessory muscles, no dullness to percussion, decreased without rales or rhonchi  Cardiovascular:  Rhythm regular, heart sounds  normal, no murmurs or gallops, no peripheral edema Abdomen: soft and non-tender, no hepatosplenomegaly, BS normal. Musculoskeletal: No deformities, no cyanosis or clubbing Neuro:  alert, non focal, no tremors       Assessment & Plan:

## 2016-12-23 NOTE — Assessment & Plan Note (Signed)
She very likely has OSA related to obesity. I advised her home sleep study. She would like to defer this for now

## 2016-12-23 NOTE — Assessment & Plan Note (Addendum)
She seems to restrictive lung disease which is related to obesity. There is no evidence of pulmonary fibrosis on exam or on chest x-ray Her episode in April seems to have been related to allergies causing some bronchospasm. Her desaturation during ambulation and high resting heart rate are of concern - since no other etiology identified would suggest we check her TSH and a d-dimer -again she has no risk factors for VTE If further workup required, we can pursue a stress echo to look for diastolic dysfunction during exercise. This would also help to evaluate pulmonary hypertension

## 2016-12-23 NOTE — Patient Instructions (Signed)
Your oxygen level drops on walking Check blood work, if this is negative -no further testing required  You may have obstructive sleep apnea Consider home sleep study

## 2016-12-24 LAB — TSH: TSH: 2.8 u[IU]/mL (ref 0.450–4.500)

## 2016-12-24 LAB — D-DIMER, QUANTITATIVE: D-DIMER: 0.43 mg/L FEU (ref 0.00–0.49)

## 2017-03-08 ENCOUNTER — Ambulatory Visit: Payer: BLUE CROSS/BLUE SHIELD | Admitting: Family Medicine

## 2017-04-28 ENCOUNTER — Other Ambulatory Visit: Payer: Self-pay | Admitting: Family Medicine

## 2017-05-25 ENCOUNTER — Ambulatory Visit: Payer: BLUE CROSS/BLUE SHIELD | Admitting: Family Medicine

## 2017-05-25 ENCOUNTER — Encounter: Payer: Self-pay | Admitting: Family Medicine

## 2017-05-25 ENCOUNTER — Other Ambulatory Visit: Payer: Self-pay | Admitting: Family Medicine

## 2017-05-25 VITALS — BP 126/75 | HR 91 | Ht 62.0 in | Wt 284.0 lb

## 2017-05-25 DIAGNOSIS — I10 Essential (primary) hypertension: Secondary | ICD-10-CM

## 2017-05-25 DIAGNOSIS — E118 Type 2 diabetes mellitus with unspecified complications: Secondary | ICD-10-CM | POA: Diagnosis not present

## 2017-05-25 DIAGNOSIS — E11319 Type 2 diabetes mellitus with unspecified diabetic retinopathy without macular edema: Secondary | ICD-10-CM | POA: Diagnosis not present

## 2017-05-25 DIAGNOSIS — Z6841 Body Mass Index (BMI) 40.0 and over, adult: Secondary | ICD-10-CM | POA: Diagnosis not present

## 2017-05-25 LAB — POCT GLYCOSYLATED HEMOGLOBIN (HGB A1C): HEMOGLOBIN A1C: 7.4

## 2017-05-25 NOTE — Progress Notes (Signed)
Subjective:    CC: DM and BP check.   HPI:  Diabetes - no hypoglycemic events. No wounds or sores that are not healing well. No increased thirst or urination. Checking glucose at home. Taking medications as prescribed without any side effects.  She plans on exercising. Staring walking every other day with her husband.   Lab Results  Component Value Date   HGBA1C 7.2 12/06/2016    Hypertension- Pt denies chest pain, SOB, dizziness, or heart palpitations.  Taking meds as directed w/o problems.  Denies medication side effects.    Diabetic retinopathy - mild, last eye exam was July.    Obesity - says she really hasn't done well with her diet for about 4 months and really hasn't been exercising.    Past medical history, Surgical history, Family history not pertinant except as noted below, Social history, Allergies, and medications have been entered into the medical record, reviewed, and corrections made.   Review of Systems: No fevers, chills, night sweats, weight loss, chest pain, or shortness of breath.   Objective:    General: Well Developed, well nourished, and in no acute distress.  Neuro: Alert and oriented x3, extra-ocular muscles intact, sensation grossly intact.  HEENT: Normocephalic, atraumatic  Skin: Warm and dry, no rashes. Cardiac: Regular rate and rhythm, no murmurs rubs or gallops, no lower extremity edema.  Respiratory: Clear to auscultation bilaterally. Not using accessory muscles, speaking in full sentences.   Impression and Recommendations:    DM - Uncontrolled.  A1C is 7.4 today.  Discussed working on diet and exercise and f/u in 6 weeks.  Foot exam performed today. Discussed how uncontrolled sugar affects her eyes.    HTN - Well controlled. Continue current regimen. Follow up in  4 months.  Due for BMP.    Diabetic retinopathy - monitored regularly.    Morbid obesity/BMI 69 - had a dicussion today about setting small goals for increasing veggies, decreasing  carbs and starting a walking program.  Happy to refer her to nutrition, etc.    Negative depression screen.

## 2017-05-25 NOTE — Patient Instructions (Signed)
Bring in glucose log at next office visiit

## 2017-05-26 DIAGNOSIS — I10 Essential (primary) hypertension: Secondary | ICD-10-CM | POA: Diagnosis not present

## 2017-05-26 LAB — BASIC METABOLIC PANEL WITH GFR
BUN / CREAT RATIO: 28 (calc) — AB (ref 6–22)
BUN: 28 mg/dL — AB (ref 7–25)
CHLORIDE: 99 mmol/L (ref 98–110)
CO2: 26 mmol/L (ref 20–32)
Calcium: 9.5 mg/dL (ref 8.6–10.2)
Creat: 1 mg/dL (ref 0.50–1.10)
GFR, Est African American: 77 mL/min/{1.73_m2} (ref >=60)
GFR, Est Non African American: 66 mL/min/{1.73_m2} (ref >=60)
GLUCOSE: 102 mg/dL — AB (ref 65–99)
Potassium: 4.8 mmol/L (ref 3.5–5.3)
Sodium: 135 mmol/L (ref 135–146)

## 2017-05-26 NOTE — Progress Notes (Signed)
All labs are normal. 

## 2017-05-30 ENCOUNTER — Other Ambulatory Visit: Payer: Self-pay | Admitting: Family Medicine

## 2017-05-30 DIAGNOSIS — E119 Type 2 diabetes mellitus without complications: Secondary | ICD-10-CM

## 2017-06-02 ENCOUNTER — Other Ambulatory Visit: Payer: Self-pay | Admitting: *Deleted

## 2017-06-02 DIAGNOSIS — E118 Type 2 diabetes mellitus with unspecified complications: Secondary | ICD-10-CM

## 2017-06-02 MED ORDER — AMBULATORY NON FORMULARY MEDICATION
4 refills | Status: DC
Start: 2017-06-02 — End: 2017-09-21

## 2017-06-06 ENCOUNTER — Telehealth: Payer: Self-pay

## 2017-06-06 ENCOUNTER — Other Ambulatory Visit: Payer: Self-pay

## 2017-06-06 DIAGNOSIS — E118 Type 2 diabetes mellitus with unspecified complications: Secondary | ICD-10-CM

## 2017-06-06 DIAGNOSIS — J069 Acute upper respiratory infection, unspecified: Secondary | ICD-10-CM

## 2017-06-06 MED ORDER — BAYER MICROLET LANCETS MISC: 12 refills | Status: DC

## 2017-06-06 MED ORDER — BAYER CONTOUR MONITOR W/DEVICE KIT: 1.0000 | PACK | 0 refills | Status: DC

## 2017-06-06 MED ORDER — GLUCOSE BLOOD VI STRP: ORAL_STRIP | 12 refills | Status: DC

## 2017-06-06 NOTE — Telephone Encounter (Signed)
Patient called in stating [facility] faxed offer prior auth req for her diabetes test strips - stating there was an issue.  Called [facility]/[place] - per Nicole/Pharmacy Tech - Patient's insurance will only pay for [employer] or [employer] diabetes supplies. All patient supplies and glucometer are for One Touch.  Electronically sending in Rx for new glucometer and diabetes supplies.

## 2017-06-07 ENCOUNTER — Telehealth: Payer: Self-pay | Admitting: Family Medicine

## 2017-06-07 NOTE — Telephone Encounter (Signed)
Received fax for PA on One Touch Ultra blue Test Strips sent through cover my meds and received authorization from 06/07/17 - 06/05/2020. I will notify the pharmacy - CF

## 2017-07-02 ENCOUNTER — Other Ambulatory Visit: Payer: Self-pay | Admitting: Family Medicine

## 2017-07-06 ENCOUNTER — Ambulatory Visit: Payer: BLUE CROSS/BLUE SHIELD | Admitting: Family Medicine

## 2017-07-21 ENCOUNTER — Other Ambulatory Visit: Payer: Self-pay | Admitting: Physician Assistant

## 2017-07-21 DIAGNOSIS — J3089 Other allergic rhinitis: Secondary | ICD-10-CM

## 2017-09-13 ENCOUNTER — Encounter: Payer: Self-pay | Admitting: Family Medicine

## 2017-09-13 NOTE — Telephone Encounter (Signed)
Patient advised of recommendations.  

## 2017-09-15 ENCOUNTER — Other Ambulatory Visit: Payer: Self-pay | Admitting: Physician Assistant

## 2017-09-15 ENCOUNTER — Emergency Department
Admission: EM | Admit: 2017-09-15 | Discharge: 2017-09-15 | Disposition: A | Discharge status: Home / Self Care | Payer: BLUE CROSS/BLUE SHIELD | Source: Home / Self Care

## 2017-09-15 ENCOUNTER — Other Ambulatory Visit: Payer: Self-pay

## 2017-09-15 DIAGNOSIS — J0100 Acute maxillary sinusitis, unspecified: Secondary | ICD-10-CM | POA: Diagnosis not present

## 2017-09-15 DIAGNOSIS — J301 Allergic rhinitis due to pollen: Secondary | ICD-10-CM | POA: Diagnosis not present

## 2017-09-15 DIAGNOSIS — J069 Acute upper respiratory infection, unspecified: Secondary | ICD-10-CM

## 2017-09-15 DIAGNOSIS — R05 Cough: Secondary | ICD-10-CM | POA: Diagnosis not present

## 2017-09-15 DIAGNOSIS — R059 Cough, unspecified: Secondary | ICD-10-CM

## 2017-09-15 MED ORDER — AMOXICILLIN-POT CLAVULANATE 875-125 MG PO TABS: 1.0000 | ORAL_TABLET | Freq: Two times a day (BID) | ORAL | 0 refills | Status: DC

## 2017-09-15 NOTE — ED Triage Notes (Signed)
Pt has been having sinus pain and pressure mostly right side of face.  Started on Monday or Tuesday of this week.

## 2017-09-15 NOTE — ED Provider Notes (Signed)
[name]    CSN: [id number] Arrival date & time: 09/15/17  0827     History   Chief Complaint Chief Complaint  Patient presents with  . Facial Pain    mostly right  Sinus complaints  HPI Laurie Hoffman is a 52 y.o. female.   HPI Patient gives a history of having had respiratory symptoms over the past week.  She has had allergies, and they have been bad.  She developed more more head congestion.  Now she has pressure and pain below her right eye and right cheek area.  She has been blowing out some purulent and bloody mucus.  She does have a cough but is not coughing up anything much.  She has been taking allergy medications.  She could not get in with her primary care today.  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  She has a history of allergies but not of a lot of sinus flareups.  She is diabetic with an A1c runs around 7.  Her husband is gone over a similar infection. Past Medical History:  Diagnosis Date  . Allergy   . Diabetes mellitus 04-09   type 2/ with retinopathy  . Hypertension   . Infertility, female   . Obesity     Patient Active Problem List   Diagnosis Date Noted  . Dry eye syndrome of both eyes 12/06/2016  . Chronic non-seasonal allergic rhinitis 08/16/2016  . Dyspnea and respiratory abnormalities 08/16/2016  . Severe obesity (BMI >= 40) ([place]) 05/18/2013  . MORBID OBESITY 11/14/2009  . Diabetes ([place]) 09/02/2008  . LEG EDEMA, BILATERAL 08/30/2008  . Diabetic retinopathy ([place]) 10/19/2007  . Controlled diabetes mellitus type 2 with complications ([place]) 32/35/5732  . ESSENTIAL HYPERTENSION, BENIGN 07/31/2007    History reviewed. No pertinent surgical history.  OB History   None      Home Medications    Prior to Admission medications   Medication Sig Start Date End Date Taking? Authorizing Provider  albuterol (PROVENTIL HFA;VENTOLIN HFA) 108 (90 Base) MCG/ACT inhaler Inhale 1-2 puffs into the  lungs every 6 (six) hours as needed for wheezing or shortness of breath. 08/11/16   Trixie Dredge, PA-C  [place] Medication Name: One touch ultra test strips, Dx:E11.8 test blood sugar twice daily 06/02/17   Hali Marry, MD  amoxicillin-clavulanate (AUGMENTIN) 875-125 MG tablet Take 1 tablet by mouth every 12 (twelve) hours. 09/15/17   Posey Boyer, MD  aspirin (ASPIRIN CHILDRENS) 81 MG chewable tablet Chew 81 mg by mouth daily.      [provider]  Reubin Milan LANCETS lancets Use as instructed 06/06/17   Hali Marry, MD  Blood Glucose Monitoring Suppl (BAYER CONTOUR MONITOR) w/Device KIT 1 Device by Does not apply route as directed. 06/06/17   Hali Marry, MD  glipiZIDE (GLUCOTROL XL) 2.5 MG 24 hr tablet TAKE 1 TABLET(2.5 MG) BY MOUTH DAILY WITH BREAKFAST 12/06/16   Hali Marry, MD  glucose blood (BAYER CONTOUR TEST) test strip Use as instructed 06/06/17   Hali Marry, MD  INVOKAMET [id number] MG TABS TAKE 1 TABLET BY MOUTH TWICE DAILY 05/25/17   Hali Marry, MD  METRONIDAZOLE, TOPICAL, 0.75 % LOTN Use bid as directed 11/21/13   Hali Marry, MD  montelukast (SINGULAIR) 10 MG tablet TAKE 1 TABLET BY MOUTH AT BEDTIME 07/21/17   Hali Marry, MD  NOVOTWIST 32G X 5 MM  MISC USE AS DIRECTED 07/04/17   Hali Marry, MD  nystatin (MYCOSTATIN) powder Apply topically 4 (four) times daily. 02/14/13   Hali Marry, MD  simvastatin (ZOCOR) 20 MG tablet Take 1 tablet (20 mg total) by mouth daily at 6 PM. 12/06/16   Hali Marry, MD  telmisartan-hydrochlorothiazide (MICARDIS HCT) 40-12.5 MG tablet Take 1 tablet by mouth daily. 12/06/16   Hali Marry, MD  VICTOZA 18 MG/3ML SOPN ADMINISTER 2.4 MG UNDER THE SKIN DAILY 05/30/17   Hali Marry, MD  XIIDRA 5 % SOLN Place 1 drop into both eyes 2 (two) times daily. 04/28/17   [provider]    Family  History Family History  Problem Relation Age of Onset  . Diabetes Mother        Insulin  . Cancer Father 78       melanoma  . Diabetes Sister        Insulin  . Hypertension Sister     Social History Social History   Tobacco Use  . Smoking status: Former Smoker    Types: Cigarettes    Last attempt to quit: 05/17/1986    Years since quitting: 31.3  . Smokeless tobacco: Never Used  Substance Use Topics  . Alcohol use: Yes    Comment: Rare  . Drug use: No     Allergies   Atorvastatin   Review of Systems Review of Systems Constitutional: Does not feel well HEENT: Facial pain.  Ears are stuffy and popping and discomfort in them.  Throat is been a little bit sore. Respiratory: Nonproductive cough Vascular: Unremarkable GI: Unremarkable    Physical Exam Triage Vital Signs ED Triage Vitals  Enc Vitals Group     BP      Pulse      Resp      Temp      Temp src      SpO2      Weight      Height      Head Circumference      Peak Flow      Pain Score      Pain Loc      Pain Edu?      Excl. in [place]?    No data found.  Updated Vital Signs BP 131/82 (BP Location: Right Arm)   Pulse 82   Temp 97.8 F (36.6 C) (Oral)   Ht '5\' 4"'$  (1.626 m)   Wt 264 lb (119.7 kg)   SpO2 97%   BMI 45.32 kg/m   Visual Acuity Right Eye Distance:   Left Eye Distance:   Bilateral Distance:    Right Eye Near:   Left Eye Near:    Bilateral Near:     Physical Exam Looks like she does not feel real well.  TMs normal on the left.  A little dull on the right.  No erythema.  Throat unremarkable.  Nose congested.  She is not real tender in her sinus regions right now but says she was a lot early this morning until she cleared it out.  Her chest is clear for exchange.  Heart regular without murmurs.  UC Treatments / Results  Labs (all labs ordered are listed, but only abnormal results are displayed) Labs Reviewed - No data to display  EKG None  Radiology No results  found.  Procedures Procedures (including critical care time) None Medications Ordered in UC Medications - No data to display  Initial Impression / Assessment and  Plan / UC Course  I have reviewed the triage vital signs and the nursing notes.  Pertinent labs & imaging results that were available during my care of the patient were reviewed by me and considered in my medical decision making (see chart for details).     Allergic rhinitis with secondary bacterial maxillary sinusitis. Final Clinical Impressions(s) / UC Diagnoses   Final diagnoses:  Acute non-recurrent maxillary sinusitis  Seasonal allergic rhinitis due to pollen  Cough     Discharge Instructions     Drink plenty of fluids and try to get enough rest  Plan to stay off work through the weekend.  Augmentin (amoxicillin/clavulanate) 875 mg 1 twice daily with food  Continue your allergy and Flonase nose spray medications.  Continue the Delsym for your cough  Return if worse or not improving.    ED Prescriptions    Medication Sig Dispense Auth. Provider   amoxicillin-clavulanate (AUGMENTIN) 875-125 MG tablet Take 1 tablet by mouth every 12 (twelve) hours. 14 tablet Posey Boyer, MD     Controlled Substance Prescriptions [place] Controlled Substance Registry consulted? No   Posey Boyer, MD 09/15/17 [phone]

## 2017-09-15 NOTE — Discharge Instructions (Addendum)
Drink plenty of fluids and try to get enough rest  Plan to stay off work through the weekend.  Augmentin (amoxicillin/clavulanate) 875 mg 1 twice daily with food  Continue your allergy and Flonase nose spray medications.  Continue the Delsym for your cough  Return if worse or not improving.

## 2017-09-16 ENCOUNTER — Other Ambulatory Visit: Payer: Self-pay | Admitting: Physician Assistant

## 2017-09-16 DIAGNOSIS — J069 Acute upper respiratory infection, unspecified: Secondary | ICD-10-CM

## 2017-09-16 MED ORDER — ALBUTEROL SULFATE HFA 108 (90 BASE) MCG/ACT IN AERS: 1.0000 | INHALATION_SPRAY | Freq: Four times a day (QID) | RESPIRATORY_TRACT | 0 refills | Status: DC | PRN

## 2017-09-21 ENCOUNTER — Encounter: Payer: Self-pay | Admitting: Family Medicine

## 2017-09-21 ENCOUNTER — Ambulatory Visit: Payer: BLUE CROSS/BLUE SHIELD | Admitting: Family Medicine

## 2017-09-21 VITALS — BP 127/69 | HR 72 | Ht 62.0 in | Wt 262.0 lb

## 2017-09-21 DIAGNOSIS — I10 Essential (primary) hypertension: Secondary | ICD-10-CM

## 2017-09-21 DIAGNOSIS — E118 Type 2 diabetes mellitus with unspecified complications: Secondary | ICD-10-CM | POA: Diagnosis not present

## 2017-09-21 LAB — POCT GLYCOSYLATED HEMOGLOBIN (HGB A1C): HEMOGLOBIN A1C: 6.2

## 2017-09-21 NOTE — Progress Notes (Signed)
Subjective:    CC: DM  HPI:  Diabetes - no hypoglycemic events. No wounds or sores that are not healing well. No increased thirst or urination. Checking glucose at home. Taking medications as prescribed without any side effects.  Hypertension- Pt denies chest pain, SOB, dizziness, or heart palpitations.  Taking meds as directed w/o problems.  Denies medication side effects.    Abnormal weight gain. She is now down to 262 on her weight, from 284. She is doing well.  She is eating a very strict low-carb diet.  She is cut out all bread pasta and potatoes.  She actually feels really good on it.  In fact this month she is planning on incorporating more exercise into her routine.  She says her husband's been doing the diet with her as well and he is lost about 40 pounds.  She like to be down to 230 pounds by the end of December.   Past medical history, Surgical history, Family history not pertinant except as noted below, Social history, Allergies, and medications have been entered into the medical record, reviewed, and corrections made.   Review of Systems: No fevers, chills, night sweats, weight loss, chest pain, or shortness of breath.   Objective:    General: Well Developed, well nourished, and in no acute distress.  Neuro: Alert and oriented x3, extra-ocular muscles intact, sensation grossly intact.  HEENT: Normocephalic, atraumatic  Skin: Warm and dry, no rashes. Cardiac: Regular rate and rhythm, no murmurs rubs or gallops, no lower extremity edema.  Respiratory: Clear to auscultation bilaterally. Not using accessory muscles, speaking in full sentences.   Impression and Recommendations:    DM - Well controlled. Continue current regimen.  Right job on dietary changes and her A1c shows significant improvement down to 6.2 from 7.4.  It continues to drop we may even be able to back off on some of her medication.  Follow up in  3-4 months.    HTN - Well controlled. Continue current regimen.  Follow up in  3-4 months.    Morbid Obesity/BMI 11 - she is doing amazing!!!  Her goal weight is 230 lbs by the end of December.  Keep up the great work.  I am glad she is going to start exercising as well.  Just encouraged her to take it slowly especially initially is because I do not want her to injure herself.Marland Kitchen

## 2017-09-28 DIAGNOSIS — I10 Essential (primary) hypertension: Secondary | ICD-10-CM | POA: Diagnosis not present

## 2017-09-28 DIAGNOSIS — E118 Type 2 diabetes mellitus with unspecified complications: Secondary | ICD-10-CM | POA: Diagnosis not present

## 2017-09-28 LAB — COMPLETE METABOLIC PANEL WITH GFR
AG Ratio: 1.2 (calc) (ref 1.0–2.5)
ALBUMIN MSPROF: 4.3 g/dL (ref 3.6–5.1)
ALKALINE PHOSPHATASE (APISO): 85 U/L (ref 33–130)
ALT: 22 U/L (ref 6–29)
AST: 22 U/L (ref 10–35)
BILIRUBIN TOTAL: 0.5 mg/dL (ref 0.2–1.2)
BUN / CREAT RATIO: 37 (calc) — AB (ref 6–22)
BUN: 31 mg/dL — ABNORMAL HIGH (ref 7–25)
CO2: 24 mmol/L (ref 20–32)
CREATININE: 0.84 mg/dL (ref 0.50–1.05)
Calcium: 9.7 mg/dL (ref 8.6–10.4)
Chloride: 101 mmol/L (ref 98–110)
GFR, Est African American: 94 mL/min/{1.73_m2} (ref >=60)
GFR, Est Non African American: 81 mL/min/{1.73_m2} (ref >=60)
GLOBULIN: 3.6 g/dL (ref 1.9–3.7)
Glucose, Bld: 117 mg/dL — ABNORMAL HIGH (ref 65–99)
POTASSIUM: 4.8 mmol/L (ref 3.5–5.3)
SODIUM: 136 mmol/L (ref 135–146)
Total Protein: 7.9 g/dL (ref 6.1–8.1)

## 2017-09-28 LAB — LIPID PANEL
CHOL/HDL RATIO: 1.8 (calc) (ref <5.0)
CHOLESTEROL: 144 mg/dL (ref <200)
HDL: 82 mg/dL (ref >50)
LDL Cholesterol (Calc): 46 mg/dL (calc)
Non-HDL Cholesterol (Calc): 62 mg/dL (calc) (ref <130)
Triglycerides: 84 mg/dL (ref <150)

## 2017-09-29 NOTE — Progress Notes (Signed)
Call pt: labs stable from last year. Cholesterol looks great.

## 2017-10-01 ENCOUNTER — Other Ambulatory Visit: Payer: Self-pay | Admitting: Family Medicine

## 2017-11-01 ENCOUNTER — Other Ambulatory Visit: Payer: Self-pay | Admitting: *Deleted

## 2017-11-01 DIAGNOSIS — J3089 Other allergic rhinitis: Secondary | ICD-10-CM

## 2017-11-01 MED ORDER — MONTELUKAST SODIUM 10 MG PO TABS: 10.0000 mg | ORAL_TABLET | Freq: Every day | ORAL | 3 refills | Status: DC

## 2017-11-07 ENCOUNTER — Encounter: Payer: Self-pay | Admitting: Family Medicine

## 2017-11-07 DIAGNOSIS — E118 Type 2 diabetes mellitus with unspecified complications: Secondary | ICD-10-CM

## 2017-11-07 MED ORDER — BAYER MICROLET LANCETS MISC: 12 refills | Status: DC

## 2017-11-09 ENCOUNTER — Other Ambulatory Visit: Payer: Self-pay | Admitting: Family Medicine

## 2017-11-09 DIAGNOSIS — Z1231 Encounter for screening mammogram for malignant neoplasm of breast: Secondary | ICD-10-CM

## 2017-11-11 ENCOUNTER — Encounter: Payer: Self-pay | Admitting: Family Medicine

## 2017-11-13 ENCOUNTER — Other Ambulatory Visit: Payer: Self-pay | Admitting: Family Medicine

## 2017-11-16 ENCOUNTER — Ambulatory Visit (INDEPENDENT_AMBULATORY_CARE_PROVIDER_SITE_OTHER): Payer: BLUE CROSS/BLUE SHIELD

## 2017-11-16 DIAGNOSIS — R928 Other abnormal and inconclusive findings on diagnostic imaging of breast: Secondary | ICD-10-CM | POA: Diagnosis not present

## 2017-11-16 DIAGNOSIS — Z1231 Encounter for screening mammogram for malignant neoplasm of breast: Secondary | ICD-10-CM

## 2017-11-18 ENCOUNTER — Other Ambulatory Visit: Payer: Self-pay | Admitting: Family Medicine

## 2017-11-18 DIAGNOSIS — R928 Other abnormal and inconclusive findings on diagnostic imaging of breast: Secondary | ICD-10-CM

## 2017-11-22 ENCOUNTER — Ambulatory Visit
Admission: RE | Admit: 2017-11-22 | Discharge: 2017-11-22 | Disposition: A | Discharge status: Home / Self Care | Payer: BLUE CROSS/BLUE SHIELD | Source: Ambulatory Visit | Attending: Family Medicine | Admitting: Family Medicine

## 2017-11-22 ENCOUNTER — Ambulatory Visit: Payer: BLUE CROSS/BLUE SHIELD

## 2017-11-22 ENCOUNTER — Other Ambulatory Visit: Payer: Self-pay | Admitting: Family Medicine

## 2017-11-22 DIAGNOSIS — R928 Other abnormal and inconclusive findings on diagnostic imaging of breast: Secondary | ICD-10-CM

## 2017-12-12 ENCOUNTER — Encounter: Payer: Self-pay | Admitting: Family Medicine

## 2017-12-12 DIAGNOSIS — E113293 Type 2 diabetes mellitus with mild nonproliferative diabetic retinopathy without macular edema, bilateral: Secondary | ICD-10-CM | POA: Diagnosis not present

## 2017-12-12 LAB — HM DIABETES EYE EXAM

## 2017-12-13 MED ORDER — TELMISARTAN 40 MG PO TABS: 40.0000 mg | ORAL_TABLET | Freq: Every day | ORAL | 0 refills | Status: DC

## 2017-12-13 MED ORDER — HYDROCHLOROTHIAZIDE 12.5 MG PO TABS: 12.5000 mg | ORAL_TABLET | Freq: Every day | ORAL | 0 refills | Status: DC

## 2017-12-13 NOTE — Telephone Encounter (Signed)
I just went ahead and set separate prescriptions for the telmisartan and for the hydrochlorothiazide instead of the combination pill.  If this is not something that the pharmacy has on hand, they will have to call us and let us know which [facility] medications they have in stock.

## 2017-12-25 ENCOUNTER — Other Ambulatory Visit: Payer: Self-pay | Admitting: Family Medicine

## 2017-12-30 ENCOUNTER — Other Ambulatory Visit: Payer: Self-pay | Admitting: Family Medicine

## 2017-12-30 DIAGNOSIS — E119 Type 2 diabetes mellitus without complications: Secondary | ICD-10-CM

## 2018-01-08 ENCOUNTER — Other Ambulatory Visit: Payer: Self-pay | Admitting: Family Medicine

## 2018-01-20 ENCOUNTER — Encounter: Payer: Self-pay | Admitting: Family Medicine

## 2018-01-24 ENCOUNTER — Ambulatory Visit: Payer: BLUE CROSS/BLUE SHIELD | Admitting: Family Medicine

## 2018-01-24 ENCOUNTER — Encounter: Payer: Self-pay | Admitting: Family Medicine

## 2018-01-24 VITALS — BP 130/84 | HR 86 | Ht 62.0 in | Wt 255.0 lb

## 2018-01-24 DIAGNOSIS — I10 Essential (primary) hypertension: Secondary | ICD-10-CM

## 2018-01-24 DIAGNOSIS — Z23 Encounter for immunization: Secondary | ICD-10-CM | POA: Diagnosis not present

## 2018-01-24 DIAGNOSIS — E118 Type 2 diabetes mellitus with unspecified complications: Secondary | ICD-10-CM

## 2018-01-24 DIAGNOSIS — R635 Abnormal weight gain: Secondary | ICD-10-CM

## 2018-01-24 DIAGNOSIS — E119 Type 2 diabetes mellitus without complications: Secondary | ICD-10-CM

## 2018-01-24 DIAGNOSIS — Z6841 Body Mass Index (BMI) 40.0 and over, adult: Secondary | ICD-10-CM

## 2018-01-24 LAB — POCT GLYCOSYLATED HEMOGLOBIN (HGB A1C): Hemoglobin A1C: 7 % — AB (ref 4.0–5.6)

## 2018-01-24 MED ORDER — GLIPIZIDE ER 2.5 MG PO TB24: ORAL_TABLET | ORAL | 3 refills | Status: DC

## 2018-01-24 NOTE — Progress Notes (Signed)
Subjective:    CC: DM, HTN  HPI:  Diabetes - no hypoglycemic events. No wounds or sores that are not healing well. No increased thirst or urination. Checking glucose at home. Taking medications as prescribed without any side effects.  She did stop taking her glipizide.  She was hoping that she was losing weight she did not need it.  She did continue the invokamet and the Victoza though. She is running short on her Victoza pens though.    Hypertension- Pt denies chest pain, SOB, dizziness, or heart palpitations.  Taking meds as directed w/o problems.  Denies medication side effects.    Lab Results  Component Value Date   CHOL 144 09/28/2017   HDL 82 09/28/2017   LDLCALC 46 09/28/2017   TRIG 84 09/28/2017   CHOLHDL 1.8 09/28/2017   Obesity/BMI greater than 40-she says she has done a lot of work to continue to stick to her diet over the last 4 months.  She said she is only had bread one time and had one other splurge.  But otherwise she is been very consistent and she is actually down 7 pounds.  Past medical history, Surgical history, Family history not pertinant except as noted below, Social history, Allergies, and medications have been entered into the medical record, reviewed, and corrections made.   Review of Systems: No fevers, chills, night sweats, weight loss, chest pain, or shortness of breath.   Objective:    General: Well Developed, well nourished, and in no acute distress.  Neuro: Alert and oriented x3, extra-ocular muscles intact, sensation grossly intact.  HEENT: Normocephalic, atraumatic  Skin: Warm and dry, no rashes. Cardiac: Regular rate and rhythm, no murmurs rubs or gallops, no lower extremity edema.  Respiratory: Clear to auscultation bilaterally. Not using accessory muscles, speaking in full sentences.   Impression and Recommendations:    DM - Uncontrolled. Restart glipizide. F/U in 3 months.  If she is doing well at that point we might even be able to split the  glipizide in half or if she starts having any hypoglycemic events that are frequent then I want her to go ahead and start splitting her glipizide in half.  She does have a pill cutter at home.  HTN - Well controlled. Continue current regimen. Follow up in  3 months.    Abnormal weight gain/BMI 46 - she id down about 7 lbs.  She is doing fantastic. She has really stuck to her diet of low carb eating.    Discussed need colon ca screening: discussed options again.  She is leaning towards Cologuard for now but I did encourage her to check with her insurance and I gave her a pamphlet with additional information in it today.

## 2018-03-01 ENCOUNTER — Ambulatory Visit: Payer: BLUE CROSS/BLUE SHIELD | Admitting: Family Medicine

## 2018-03-01 ENCOUNTER — Other Ambulatory Visit (HOSPITAL_COMMUNITY)
Admission: RE | Admit: 2018-03-01 | Discharge: 2018-03-01 | Disposition: A | Discharge status: Home / Self Care | Payer: BLUE CROSS/BLUE SHIELD | Source: Ambulatory Visit | Attending: Family Medicine | Admitting: Family Medicine

## 2018-03-01 ENCOUNTER — Encounter: Payer: Self-pay | Admitting: Family Medicine

## 2018-03-01 VITALS — BP 136/74 | HR 100 | Ht 62.0 in | Wt 250.0 lb

## 2018-03-01 DIAGNOSIS — B372 Candidiasis of skin and nail: Secondary | ICD-10-CM

## 2018-03-01 DIAGNOSIS — Z124 Encounter for screening for malignant neoplasm of cervix: Secondary | ICD-10-CM

## 2018-03-01 DIAGNOSIS — N9089 Other specified noninflammatory disorders of vulva and perineum: Secondary | ICD-10-CM | POA: Diagnosis not present

## 2018-03-01 MED ORDER — NYSTATIN 100000 UNIT/GM EX POWD: Freq: Four times a day (QID) | CUTANEOUS | 2 refills | Status: DC

## 2018-03-01 MED ORDER — FLUCONAZOLE 150 MG PO TABS: 150.0000 mg | ORAL_TABLET | ORAL | 0 refills | Status: DC

## 2018-03-01 NOTE — Progress Notes (Addendum)
Subjective:    Patient ID: Laurie Hoffman, female    DOB: Dec 24, 1965, 52 y.o.   MRN: [id number]  HPI 52 yo female comes in c/o of a mass on the left upper labia. Says has been been there for years sometimes it will get larger but that seems to always go back down but it never goes completely away. More recently feels like ti has gotten larger and has become irritated and tender.  No worsening or alleviating factors.  No injury or trauma.  She denies any fevers chills or sweats.  She denies any vaginal symptoms such as increased or change in vaginal discharge itching or irritation.  Is also been 5 years since her last Pap smear which was October 2014.  Not had one since then.  Her mammogram is up-to-date.    Review of Systems  BP 136/74   Pulse 100   Ht 5' 2"  (1.575 m)   Wt 250 lb (113.4 kg)   SpO2 99%   BMI 45.73 kg/m     Allergies  Allergen Reactions  . Atorvastatin Other (See Comments)    Leg weakness    Past Medical History:  Diagnosis Date  . Allergy   . Diabetes mellitus 04-09   type 2/ with retinopathy  . Hypertension   . Infertility, female   . Obesity     No past surgical history on file.  Social History   Socioeconomic History  . Marital status: Married    Spouse name: Not on file  . Number of children: Not on file  . Years of education: Not on file  . Highest education level: Not on file  Occupational History  . Not on file  Social Needs  . Financial resource strain: Not on file  . Food insecurity:    Worry: Not on file    Inability: Not on file  . Transportation needs:    Medical: Not on file    Non-medical: Not on file  Tobacco Use  . Smoking status: Former Smoker    Types: Cigarettes    Last attempt to quit: 05/17/1986    Years since quitting: 31.8  . Smokeless tobacco: Never Used  Substance and Sexual Activity  . Alcohol use: Yes    Comment: Rare  . Drug use: No  . Sexual activity: Not on file  Lifestyle  . Physical activity:    Days per  week: Not on file    Minutes per session: Not on file  . Stress: Not on file  Relationships  . Social connections:    Talks on phone: Not on file    Gets together: Not on file    Attends religious service: Not on file    Active member of club or organization: Not on file    Attends meetings of clubs or organizations: Not on file    Relationship status: Not on file  . Intimate partner violence:    Fear of current or ex partner: Not on file    Emotionally abused: Not on file    Physically abused: Not on file    Forced sexual activity: Not on file  Other Topics Concern  . Not on file  Social History Narrative  . Not on file    Family History  Problem Relation Age of Onset  . Diabetes Mother        Insulin  . Cancer Father 65       melanoma  . Diabetes Sister  Insulin  . Hypertension Sister     Outpatient Encounter Medications as of 03/01/2018  Medication Sig  . albuterol (PROVENTIL HFA;VENTOLIN HFA) 108 (90 Base) MCG/ACT inhaler Inhale 1-2 puffs into the lungs every 6 (six) hours as needed for wheezing or shortness of breath.  Marland Kitchen aspirin (ASPIRIN CHILDRENS) 81 MG chewable tablet Chew 81 mg by mouth daily.    Marland Kitchen BAYER MICROLET LANCETS lancets Use to check blood sugar twice daily. Dx: E11.8  . Blood Glucose Monitoring Suppl (BAYER CONTOUR MONITOR) w/Device KIT 1 Device by Does not apply route as directed.  Marland Kitchen glipiZIDE (GLUCOTROL XL) 2.5 MG 24 hr tablet TAKE 1 TABLET(2.5 MG) BY MOUTH DAILY WITH BREAKFAST  . glucose blood (BAYER CONTOUR TEST) test strip Use as instructed  . INVOKAMET [phone] MG TABS TAKE 1 TABLET BY MOUTH TWICE DAILY  . montelukast (SINGULAIR) 10 MG tablet Take 1 tablet (10 mg total) by mouth at bedtime.  Marland Kitchen NOVOTWIST 32G X 5 MM MISC USE AS DIRECTED  . nystatin (MYCOSTATIN/NYSTOP) powder Apply topically 4 (four) times daily.  . simvastatin (ZOCOR) 20 MG tablet TAKE 1 TABLET(20 MG) BY MOUTH DAILY AT 6 PM  . telmisartan-hydrochlorothiazide (MICARDIS HCT)  40-12.5 MG tablet TAKE 1 TABLET BY MOUTH DAILY  . VICTOZA 18 MG/3ML SOPN ADMINISTER 2.4 MG UNDER THE SKIN DAILY  . XIIDRA 5 % SOLN Place 1 drop into both eyes 2 (two) times daily.  . [DISCONTINUED] nystatin (MYCOSTATIN) powder Apply topically 4 (four) times daily.  . fluconazole (DIFLUCAN) 150 MG tablet Take 1 tablet (150 mg total) by mouth every other day.   No facility-administered encounter medications on file as of 03/01/2018.          Objective:   Physical Exam  Constitutional: She is oriented to person, place, and time. She appears well-developed and well-nourished.  HENT:  Head: Normocephalic and atraumatic.  Eyes: Conjunctivae and EOM are normal.  Cardiovascular: Normal rate.  Pulmonary/Chest: Effort normal.  Genitourinary: Vagina normal.    There is no rash or tenderness on the right labia. There is lesion on the left labia. There is no rash on the left labia. Cervix exhibits no motion tenderness and no discharge. Left adnexum displays no fullness. No erythema, tenderness or bleeding in the vagina. No vaginal discharge found.  Neurological: She is alert and oriented to person, place, and time.  Skin: Skin is dry. Rash noted. No pallor.  He also has a rash just underneath the pannus that is erythematous with satellite lesions as well as some redness maceration and moisture along the groin crease bilaterally.  Psychiatric: She has a normal mood and affect. Her behavior is normal.  Vitals reviewed.         Assessment & Plan:  Mass on labia -commend referral to OB/GYN for biopsy.  I am concerned as this definitely looks like some type of mass whether or not it is benign or not I am not clear.  Cervical cancer screening. Used small speculum.  Will obtain.  Will call with results once available.  Yeast skin -we will treat with oral Diflucan since she has such a widespread area affected in addition to nystatin powder.

## 2018-03-02 LAB — CYTOLOGY - PAP
Diagnosis: NEGATIVE
HPV: NOT DETECTED

## 2018-03-03 NOTE — Progress Notes (Signed)
Call patient: Your Pap smear is normal. Repeat in 5 years.

## 2018-03-16 ENCOUNTER — Ambulatory Visit: Payer: BLUE CROSS/BLUE SHIELD | Admitting: Obstetrics & Gynecology

## 2018-03-16 ENCOUNTER — Encounter: Payer: Self-pay | Admitting: Obstetrics & Gynecology

## 2018-03-16 VITALS — BP 128/74 | HR 88 | Resp 16 | Ht 62.0 in | Wt 254.0 lb

## 2018-03-16 DIAGNOSIS — N9089 Other specified noninflammatory disorders of vulva and perineum: Secondary | ICD-10-CM

## 2018-03-16 DIAGNOSIS — N901 Moderate vulvar dysplasia: Secondary | ICD-10-CM | POA: Diagnosis not present

## 2018-03-16 NOTE — Progress Notes (Signed)
   Subjective:    Patient ID: Laurie Hoffman, female    DOB: 21-Aug-1965, 52 y.o.   MRN: [id number]  HPI 52 yo married P0 here as a referral due to the finding of a left labial mass. She first noticed this about 10 months ago. She had a pap smear about 2 years ago and does not recall hearing about it then.   Review of Systems Her husband has been treated for osteosarcoma and has an artificial knee and they are abstinent.    Objective:   Physical Exam Breathing, conversing, and ambulating normally Well nourished, well hydrated White female, no apparent distress Vulvar findings significant for a 4 x 3 cm exophytic lesion on the left labia majora. I prepped the area with betadine and used a 4 mm punch biopsy after numbing the area with lidocaine.  Silver nitrate yielded hemostasis. She tolerated the procedure well.     Assessment & Plan:  Vulvar lesion- I have expressed to her my worries that this is a vulvar malignancy. She tells me that she was prepared to hear that.  She would like me to send her a message via MyChart about her pathology when it is available.

## 2018-03-23 ENCOUNTER — Telehealth: Payer: Self-pay | Admitting: *Deleted

## 2018-03-23 DIAGNOSIS — R8769 Abnormal cytological findings in specimens from other female genital organs: Secondary | ICD-10-CM

## 2018-03-23 NOTE — Telephone Encounter (Signed)
LM on voicemail that appt was made with Dr Aldean Ast @ [facility].  Appt is 04/11/18 to arrive @ 9:30 for a 9:45 appt.

## 2018-03-23 NOTE — Telephone Encounter (Addendum)
Spoke with Laurie Hoffman from Dr. Alease Medina office to schedule an appt. Laurie Hoffman will contact the patient.

## 2018-04-11 ENCOUNTER — Encounter: Payer: Self-pay | Admitting: Gynecology

## 2018-04-11 ENCOUNTER — Inpatient Hospital Stay: Payer: BLUE CROSS/BLUE SHIELD | Attending: Gynecology | Admitting: Gynecology

## 2018-04-11 VITALS — BP 131/80 | HR 75 | Temp 98.1°F | Resp 20 | Ht 63.75 in | Wt 253.1 lb

## 2018-04-11 DIAGNOSIS — D071 Carcinoma in situ of vulva: Secondary | ICD-10-CM

## 2018-04-11 DIAGNOSIS — Z87891 Personal history of nicotine dependence: Secondary | ICD-10-CM

## 2018-04-11 NOTE — Progress Notes (Signed)
Consult Note: Gyn-Onc   Laurie Hoffman 52 y.o. female  Chief Complaint  Patient presents with  . 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  Recent biopsy shows only VIN 2.  Plan: I explained the patient and her husband that I am concerned this lesion is actually invasive carcinoma.  I would recommend a wide local excision (modified radical vulvectomy) for primary diagnosis and also primary treatment of the lesion.  If this is invasive carcinoma by inguinal lymphadenectomy will need to be done as a second procedure.  The risks of vulvectomy include infection and wound breakdown were discussed.  Patient is encouraged to aggressively manage her diabetes given that this is a significant risk factor for wound infection.   HPI: 52 year old white married female gravida 0 seen in consultation request of Dr.Metheney regarding management of a vulvar lesion.  Apparently lesion is been present for a number of years.  The patient reports that it is on occasion been larger and then smaller.  She has some pain associated with the lesion.  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  Patient denies any past gynecologic history although she went through menopause at age 17.  This is the same age as her mother went through menopause.  She claims Pap smears have been normal in the past.  She does have a recent Pap smear that was normal.  Review of Systems:10 point review of systems is negative except as noted in interval history.   Vitals: Blood pressure 131/80, pulse 75, temperature 98.1 F (36.7 C), temperature source Oral, resp. rate 20, height 5' 3.75" (1.619 m), weight 253 lb 1.6 oz (114.8 kg), SpO2 97 %.  Physical Exam: General : The patient is a healthy woman in no acute distress.  HEENT: normocephalic, extraoccular movements normal; neck is supple without thyromegally  Lynphnodes: Supraclavicular  and inguinal nodes not enlarged  Abdomen: Obese soft, non-tender, no ascites, no organomegally, no masses, no hernias.  There is a fair amount of erythema below her pannus. Pelvic:  EGBUS: Normal female, there is a 3 cm exophytic lesion on the left anterior labia majora.  This is not fixed. Vagina: Normal, no lesions  Urethra and Bladder: Normal, non-tender  Cervix: Normal Uterus: Difficult to evaluate given the patient's habitus.    Bi-manual examination: Non-tender; no adenxal masses or nodularity  Rectal: normal sphincter tone, no masses, no blood  Lower extremities: No edema or varicosities. Normal range of motion      Allergies  Allergen Reactions  . Atorvastatin Other (See Comments)    Leg weakness    Past Medical History:  Diagnosis Date  . Allergy   . Diabetes mellitus 04-09   type 2/ with retinopathy  . Dry eye syndrome   . Hypertension   . Infertility, female   . Obesity     History reviewed. No pertinent surgical history.  Current Outpatient Medications  Medication Sig Dispense Refill  . albuterol (PROVENTIL HFA;VENTOLIN HFA) 108 (90 Base) MCG/ACT inhaler Inhale 1-2 puffs into the lungs every 6 (six) hours as needed for wheezing or shortness of breath. 1 Inhaler 0  . BAYER MICROLET LANCETS lancets Use to check blood sugar twice daily. Dx: E11.8 100 each 12  . Blood Glucose Monitoring Suppl (BAYER CONTOUR MONITOR) w/Device KIT 1 Device by Does not apply route as directed. 1 kit 0  . glipiZIDE (GLUCOTROL XL) 2.5 MG 24 hr  tablet TAKE 1 TABLET(2.5 MG) BY MOUTH DAILY WITH BREAKFAST 90 tablet 3  . glucose blood (BAYER CONTOUR TEST) test strip Use as instructed 100 each 12  . INVOKAMET [phone] MG TABS TAKE 1 TABLET BY MOUTH TWICE DAILY 60 tablet 5  . montelukast (SINGULAIR) 10 MG tablet Take 1 tablet (10 mg total) by mouth at bedtime. 90 tablet 3  . NOVOTWIST 32G X 5 MM MISC USE AS DIRECTED 100 each 11  . nystatin (MYCOSTATIN/NYSTOP) powder Apply topically 4 (four)  times daily. (Patient taking differently: Apply topically as needed. ) 60 g 2  . simvastatin (ZOCOR) 20 MG tablet TAKE 1 TABLET(20 MG) BY MOUTH DAILY AT 6 PM 90 tablet 3  . telmisartan-hydrochlorothiazide (MICARDIS HCT) 40-12.5 MG tablet TAKE 1 TABLET BY MOUTH DAILY 90 tablet 1  . VICTOZA 18 MG/3ML SOPN ADMINISTER 2.4 MG UNDER THE SKIN DAILY 12 mL 6  . XIIDRA 5 % SOLN Place 1 drop into both eyes 2 (two) times daily.  11  . aspirin (ASPIRIN CHILDRENS) 81 MG chewable tablet Chew 81 mg by mouth daily.       No current facility-administered medications for this visit.     Social History   Socioeconomic History  . Marital status: Married    Spouse name: Not on file  . Number of children: Not on file  . Years of education: Not on file  . Highest education level: Not on file  Occupational History  . Not on file  Social Needs  . Financial resource strain: Not on file  . Food insecurity:    Worry: Not on file    Inability: Not on file  . Transportation needs:    Medical: Not on file    Non-medical: Not on file  Tobacco Use  . Smoking status: Former Smoker    Types: Cigarettes    Last attempt to quit: 05/17/1986    Years since quitting: 31.9  . Smokeless tobacco: Never Used  Substance and Sexual Activity  . Alcohol use: Yes    Comment: Rare  . Drug use: No  . Sexual activity: Not on file  Lifestyle  . Physical activity:    Days per week: Not on file    Minutes per session: Not on file  . Stress: Not on file  Relationships  . Social connections:    Talks on phone: Not on file    Gets together: Not on file    Attends religious service: Not on file    Active member of club or organization: Not on file    Attends meetings of clubs or organizations: Not on file    Relationship status: Not on file  . Intimate partner violence:    Fear of current or ex partner: Not on file    Emotionally abused: Not on file    Physically abused: Not on file    Forced sexual activity: Not on file   Other Topics Concern  . Not on file  Social History Narrative  . Not on file    Family History  Problem Relation Age of Onset  . Diabetes Mother        Insulin  . Heart attack Mother   . Cancer Father 47       melanoma  . Diabetes Sister        Insulin  . Hypertension Sister       Marti Sleigh, MD 04/11/2018, 9:42 AM

## 2018-04-11 NOTE — Patient Instructions (Signed)
Plan to have a wide local excision or also known as a partial vulvectomy at the [facility] on April 25, 2018 with Dr. Fermin Schwab.  You will receive a phone call from the pre-surgical RN to discuss instructions.  Please call for any questions or concerns.   Vulvectomy  Vulvectomy is a surgical procedure to remove all or part of the outer female genital organs (vulva). The vulva includes the outer and inner lips of the vagina and the clitoris. You may need this surgery if you have a cancerous growth in your vulva. There are two types of vulvectomy:  A simple vulvectomy. This is the removal of the entire vulva.  A radical vulvectomy. A radical vulvectomy can be partial or complete. ? A partial radical vulvectomy is when part of the vulva and surrounding deep tissue is removed. ? A complete radical vulvectomy is when the vulva, clitoris, and surrounding deep tissue is removed.  During a radical vulvectomy, some lymph nodes near the vulva may also be removed. Tell a health care provider about:  Any allergies you have.  All medicines you are taking, including vitamins, herbs, eye drops, creams, and over-the-counter medicines.  Any problems you or family members have had with anesthetic medicines.  Any blood disorders you have.  Any surgeries you have had.  Any medical conditions you have.  Whether you are pregnant or may be pregnant. What are the risks? Generally, this is a safe procedure. However, problems may occur, including:  Infection.  Bleeding.  Allergic reactions to medicines.  Damage to other structures or organs.  Urinary tract infections.  Lymphedema. This is when your legs swell after the removal of lymph nodes from your groin area.  Pain or decreased sexual pleasure when having sex.  Long-term vaginal swelling, tightness, numbness, or pain.  A blood clot that may travel to the lung (pulmonary embolism).  What happens before  the procedure?  Follow instructions from your health care provider about eating or drinking restrictions.  Ask your health care provider about: ? Changing or stopping your regular medicines. This is especially important if you are taking diabetes medicines or blood thinners. ? Taking medicines such as aspirin and ibuprofen. These medicines can thin your blood. Do not take these medicines before your procedure if your health care provider instructs you not to.  Ask your health care provider how your surgical site will be marked or identified.  You may be given antibiotic medicine to help prevent infection.  Plan to have someone take you home after the procedure.  If you will be going home right after the procedure, plan to have someone with you for 24 hours. What happens during the procedure?  To reduce your risk of infection: ? Your health care team will wash or sanitize their hands. ? Your skin will be washed with soap.  An IV tube will be inserted into one of your veins.  You will be given one or more of the following: ? A medicine to help you relax (sedative). ? A medicine to make you fall asleep (general anesthetic). ? A medicine that is injected into your spine to numb the area below and slightly above the injection site (spinal anesthetic).  A tube (catheter) may be inserted through the outer opening of your bladder (urethra) to drain urine during and after surgery.  Depending on the type of vulvectomy you are having, your surgeon will make an incision and remove the affected area. This may include: ?  Removing the entire vulva. ? Removing part of the vulva, surrounding deep tissue, and lymph nodes. ? Removing the vulva, clitoris, surrounding deep tissue, and lymph nodes.  If your lymph nodes are removed, a drain may be placed in the area to help avoid fluid buildup.  Your incisions will be closed and covered with a bandage (dressing). The procedure may vary among health  care providers and hospitals. What happens after the procedure?  Your blood pressure, heart rate, breathing rate, and blood oxygen level will be monitored often until the medicines you were given have worn off.  You will get medicine for pain as needed.  You may get medicine to prevent constipation.  You may be on a liquid diet at first, and then switch to a regular diet.  When you are taking fluids well, your IV will be removed.  If your catheter was left in place after surgery, it will be removed when your health care provider approves.  You will be asked to breathe deeply and to get out of bed and walk as soon as you can.  You may have to wear compression stockings. These stockings help to prevent blood clots and reduce swelling in your legs.  Do not drive for 24 hours if you received a sedative. This information is not intended to replace advice given to you by your health care provider. Make sure you discuss any questions you have with your health care provider. Document Released: 05/30/2015 Document Revised: 10/09/2015 Document Reviewed: 04/28/2015 Elsevier Interactive Patient Education  [employer].

## 2018-04-11 NOTE — H&P (View-Only) (Signed)
Consult Note: Gyn-Onc   Laurie Hoffman 52 y.o. female  Chief Complaint  Patient presents with  . 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  Recent biopsy shows only VIN 2.  Plan: I explained the patient and her husband that I am concerned this lesion is actually invasive carcinoma.  I would recommend a wide local excision (modified radical vulvectomy) for primary diagnosis and also primary treatment of the lesion.  If this is invasive carcinoma by inguinal lymphadenectomy will need to be done as a second procedure.  The risks of vulvectomy include infection and wound breakdown were discussed.  Patient is encouraged to aggressively manage her diabetes given that this is a significant risk factor for wound infection.   HPI: 52 year old white married female gravida 0 seen in consultation request of Dr.Metheney regarding management of a vulvar lesion.  Apparently lesion is been present for a number of years.  The patient reports that it is on occasion been larger and then smaller.  She has some pain associated with the lesion.  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  Patient denies any past gynecologic history although she went through menopause at age 17.  This is the same age as her mother went through menopause.  She claims Pap smears have been normal in the past.  She does have a recent Pap smear that was normal.  Review of Systems:10 point review of systems is negative except as noted in interval history.   Vitals: Blood pressure 131/80, pulse 75, temperature 98.1 F (36.7 C), temperature source Oral, resp. rate 20, height 5' 3.75" (1.619 m), weight 253 lb 1.6 oz (114.8 kg), SpO2 97 %.  Physical Exam: General : The patient is a healthy woman in no acute distress.  HEENT: normocephalic, extraoccular movements normal; neck is supple without thyromegally  Lynphnodes: Supraclavicular  and inguinal nodes not enlarged  Abdomen: Obese soft, non-tender, no ascites, no organomegally, no masses, no hernias.  There is a fair amount of erythema below her pannus. Pelvic:  EGBUS: Normal female, there is a 3 cm exophytic lesion on the left anterior labia majora.  This is not fixed. Vagina: Normal, no lesions  Urethra and Bladder: Normal, non-tender  Cervix: Normal Uterus: Difficult to evaluate given the patient's habitus.    Bi-manual examination: Non-tender; no adenxal masses or nodularity  Rectal: normal sphincter tone, no masses, no blood  Lower extremities: No edema or varicosities. Normal range of motion      Allergies  Allergen Reactions  . Atorvastatin Other (See Comments)    Leg weakness    Past Medical History:  Diagnosis Date  . Allergy   . Diabetes mellitus 04-09   type 2/ with retinopathy  . Dry eye syndrome   . Hypertension   . Infertility, female   . Obesity     History reviewed. No pertinent surgical history.  Current Outpatient Medications  Medication Sig Dispense Refill  . albuterol (PROVENTIL HFA;VENTOLIN HFA) 108 (90 Base) MCG/ACT inhaler Inhale 1-2 puffs into the lungs every 6 (six) hours as needed for wheezing or shortness of breath. 1 Inhaler 0  . BAYER MICROLET LANCETS lancets Use to check blood sugar twice daily. Dx: E11.8 100 each 12  . Blood Glucose Monitoring Suppl (BAYER CONTOUR MONITOR) w/Device KIT 1 Device by Does not apply route as directed. 1 kit 0  . glipiZIDE (GLUCOTROL XL) 2.5 MG 24 hr  tablet TAKE 1 TABLET(2.5 MG) BY MOUTH DAILY WITH BREAKFAST 90 tablet 3  . glucose blood (BAYER CONTOUR TEST) test strip Use as instructed 100 each 12  . INVOKAMET [phone] MG TABS TAKE 1 TABLET BY MOUTH TWICE DAILY 60 tablet 5  . montelukast (SINGULAIR) 10 MG tablet Take 1 tablet (10 mg total) by mouth at bedtime. 90 tablet 3  . NOVOTWIST 32G X 5 MM MISC USE AS DIRECTED 100 each 11  . nystatin (MYCOSTATIN/NYSTOP) powder Apply topically 4 (four)  times daily. (Patient taking differently: Apply topically as needed. ) 60 g 2  . simvastatin (ZOCOR) 20 MG tablet TAKE 1 TABLET(20 MG) BY MOUTH DAILY AT 6 PM 90 tablet 3  . telmisartan-hydrochlorothiazide (MICARDIS HCT) 40-12.5 MG tablet TAKE 1 TABLET BY MOUTH DAILY 90 tablet 1  . VICTOZA 18 MG/3ML SOPN ADMINISTER 2.4 MG UNDER THE SKIN DAILY 12 mL 6  . XIIDRA 5 % SOLN Place 1 drop into both eyes 2 (two) times daily.  11  . aspirin (ASPIRIN CHILDRENS) 81 MG chewable tablet Chew 81 mg by mouth daily.       No current facility-administered medications for this visit.     Social History   Socioeconomic History  . Marital status: Married    Spouse name: Not on file  . Number of children: Not on file  . Years of education: Not on file  . Highest education level: Not on file  Occupational History  . Not on file  Social Needs  . Financial resource strain: Not on file  . Food insecurity:    Worry: Not on file    Inability: Not on file  . Transportation needs:    Medical: Not on file    Non-medical: Not on file  Tobacco Use  . Smoking status: Former Smoker    Types: Cigarettes    Last attempt to quit: 05/17/1986    Years since quitting: 31.9  . Smokeless tobacco: Never Used  Substance and Sexual Activity  . Alcohol use: Yes    Comment: Rare  . Drug use: No  . Sexual activity: Not on file  Lifestyle  . Physical activity:    Days per week: Not on file    Minutes per session: Not on file  . Stress: Not on file  Relationships  . Social connections:    Talks on phone: Not on file    Gets together: Not on file    Attends religious service: Not on file    Active member of club or organization: Not on file    Attends meetings of clubs or organizations: Not on file    Relationship status: Not on file  . Intimate partner violence:    Fear of current or ex partner: Not on file    Emotionally abused: Not on file    Physically abused: Not on file    Forced sexual activity: Not on file   Other Topics Concern  . Not on file  Social History Narrative  . Not on file    Family History  Problem Relation Age of Onset  . Diabetes Mother        Insulin  . Heart attack Mother   . Cancer Father 47       melanoma  . Diabetes Sister        Insulin  . Hypertension Sister       Marti Sleigh, MD 04/11/2018, 9:42 AM

## 2018-04-18 ENCOUNTER — Encounter (HOSPITAL_BASED_OUTPATIENT_CLINIC_OR_DEPARTMENT_OTHER): Payer: Self-pay | Admitting: *Deleted

## 2018-04-18 ENCOUNTER — Other Ambulatory Visit: Payer: Self-pay

## 2018-04-18 NOTE — Progress Notes (Signed)
Spoke w/ pt via phone for pre-op interview.  Npo after mn w/ exception clear liquids until 0700 (no cream /milk products).  Arrive at 1100.  Needs istat 8 and ekg.

## 2018-04-25 ENCOUNTER — Encounter (HOSPITAL_BASED_OUTPATIENT_CLINIC_OR_DEPARTMENT_OTHER)
Admission: RE | Disposition: A | Discharge status: Home / Self Care | Payer: Self-pay | Source: Home / Self Care | Attending: Gynecology

## 2018-04-25 ENCOUNTER — Ambulatory Visit (HOSPITAL_BASED_OUTPATIENT_CLINIC_OR_DEPARTMENT_OTHER): Payer: BLUE CROSS/BLUE SHIELD | Admitting: Anesthesiology

## 2018-04-25 ENCOUNTER — Encounter (HOSPITAL_BASED_OUTPATIENT_CLINIC_OR_DEPARTMENT_OTHER): Payer: Self-pay | Admitting: Anesthesiology

## 2018-04-25 ENCOUNTER — Ambulatory Visit (HOSPITAL_BASED_OUTPATIENT_CLINIC_OR_DEPARTMENT_OTHER)
Admission: RE | Admit: 2018-04-25 | Discharge: 2018-04-25 | Disposition: A | Discharge status: Home / Self Care | Payer: BLUE CROSS/BLUE SHIELD | Attending: Gynecology | Admitting: Gynecology

## 2018-04-25 DIAGNOSIS — E11319 Type 2 diabetes mellitus with unspecified diabetic retinopathy without macular edema: Secondary | ICD-10-CM | POA: Insufficient documentation

## 2018-04-25 DIAGNOSIS — Z6841 Body Mass Index (BMI) 40.0 and over, adult: Secondary | ICD-10-CM | POA: Insufficient documentation

## 2018-04-25 DIAGNOSIS — I10 Essential (primary) hypertension: Secondary | ICD-10-CM | POA: Diagnosis not present

## 2018-04-25 DIAGNOSIS — Z7982 Long term (current) use of aspirin: Secondary | ICD-10-CM | POA: Diagnosis not present

## 2018-04-25 DIAGNOSIS — N9089 Other specified noninflammatory disorders of vulva and perineum: Secondary | ICD-10-CM | POA: Diagnosis not present

## 2018-04-25 DIAGNOSIS — Z79899 Other long term (current) drug therapy: Secondary | ICD-10-CM | POA: Diagnosis not present

## 2018-04-25 DIAGNOSIS — Z87891 Personal history of nicotine dependence: Secondary | ICD-10-CM | POA: Diagnosis not present

## 2018-04-25 DIAGNOSIS — Z7984 Long term (current) use of oral hypoglycemic drugs: Secondary | ICD-10-CM | POA: Diagnosis not present

## 2018-04-25 DIAGNOSIS — C519 Malignant neoplasm of vulva, unspecified: Secondary | ICD-10-CM | POA: Diagnosis not present

## 2018-04-25 DIAGNOSIS — C510 Malignant neoplasm of labium majus: Secondary | ICD-10-CM | POA: Insufficient documentation

## 2018-04-25 HISTORY — PX: VULVECTOMY: SHX1086

## 2018-04-25 HISTORY — DX: Unspecified macular degeneration: H35.30

## 2018-04-25 HISTORY — DX: Presence of spectacles and contact lenses: Z97.3

## 2018-04-25 HISTORY — DX: Type 2 diabetes mellitus without complications: E11.9

## 2018-04-25 HISTORY — DX: Dermatitis, unspecified: L30.9

## 2018-04-25 HISTORY — DX: Other seasonal allergic rhinitis: J30.2

## 2018-04-25 LAB — POCT I-STAT, CHEM 8
BUN: 31 mg/dL — ABNORMAL HIGH (ref 6–20)
Calcium, Ion: 1.2 mmol/L (ref 1.15–1.40)
Chloride: 102 mmol/L (ref 98–111)
Creatinine, Ser: 0.9 mg/dL (ref 0.44–1.00)
Glucose, Bld: 108 mg/dL — ABNORMAL HIGH (ref 70–99)
HEMATOCRIT: 48 % — AB (ref 36.0–46.0)
Hemoglobin: 16.3 g/dL — ABNORMAL HIGH (ref 12.0–15.0)
Potassium: 4.2 mmol/L (ref 3.5–5.1)
Sodium: 139 mmol/L (ref 135–145)
TCO2: 25 mmol/L (ref 22–32)

## 2018-04-25 LAB — GLUCOSE, CAPILLARY: Glucose-Capillary: 104 mg/dL — ABNORMAL HIGH (ref 70–99)

## 2018-04-25 SURGERY — WIDE EXCISION VULVECTOMY
Anesthesia: General

## 2018-04-25 MED ORDER — PROPOFOL 10 MG/ML IV BOLUS
INTRAVENOUS | Status: AC
  Filled 2018-04-25: qty 20

## 2018-04-25 MED ORDER — ONDANSETRON HCL 4 MG/2ML IJ SOLN
4.0000 mg | Freq: Once | INTRAMUSCULAR | Status: AC | PRN
  Administered 2018-04-25: 4 mg via INTRAVENOUS
  Filled 2018-04-25: qty 2

## 2018-04-25 MED ORDER — FENTANYL CITRATE (PF) 100 MCG/2ML IJ SOLN
INTRAMUSCULAR | Status: AC
  Filled 2018-04-25: qty 2

## 2018-04-25 MED ORDER — DEXAMETHASONE SODIUM PHOSPHATE 10 MG/ML IJ SOLN
INTRAMUSCULAR | Status: DC | PRN
  Administered 2018-04-25: 5 mg via INTRAVENOUS

## 2018-04-25 MED ORDER — KETOROLAC TROMETHAMINE 30 MG/ML IJ SOLN
INTRAMUSCULAR | Status: DC | PRN
  Administered 2018-04-25: 30 mg via INTRAVENOUS

## 2018-04-25 MED ORDER — SCOPOLAMINE 1 MG/3DAYS TD PT72
1.0000 | MEDICATED_PATCH | Freq: Once | TRANSDERMAL | Status: DC
  Administered 2018-04-25: 1.5 mg via TRANSDERMAL
  Filled 2018-04-25: qty 1

## 2018-04-25 MED ORDER — ACETAMINOPHEN 500 MG PO TABS
ORAL_TABLET | ORAL | Status: AC
  Filled 2018-04-25: qty 2

## 2018-04-25 MED ORDER — BUPIVACAINE HCL (PF) 0.25 % IJ SOLN
INTRAMUSCULAR | Status: DC | PRN
  Administered 2018-04-25: 5 mL

## 2018-04-25 MED ORDER — LIDOCAINE 2% (20 MG/ML) 5 ML SYRINGE
INTRAMUSCULAR | Status: DC | PRN
  Administered 2018-04-25: 80 mg via INTRAVENOUS

## 2018-04-25 MED ORDER — SCOPOLAMINE 1 MG/3DAYS TD PT72
MEDICATED_PATCH | TRANSDERMAL | Status: AC
  Filled 2018-04-25: qty 1

## 2018-04-25 MED ORDER — MIDAZOLAM HCL 2 MG/2ML IJ SOLN
INTRAMUSCULAR | Status: DC | PRN
  Administered 2018-04-25: 2 mg via INTRAVENOUS

## 2018-04-25 MED ORDER — MIDAZOLAM HCL 2 MG/2ML IJ SOLN
INTRAMUSCULAR | Status: AC
  Filled 2018-04-25: qty 2

## 2018-04-25 MED ORDER — PROPOFOL 10 MG/ML IV BOLUS
INTRAVENOUS | Status: DC | PRN
  Administered 2018-04-25: 50 mg via INTRAVENOUS
  Administered 2018-04-25: 20 mg via INTRAVENOUS
  Administered 2018-04-25: 150 mg via INTRAVENOUS
  Administered 2018-04-25: 30 mg via INTRAVENOUS
  Administered 2018-04-25: 50 mg via INTRAVENOUS

## 2018-04-25 MED ORDER — FENTANYL CITRATE (PF) 100 MCG/2ML IJ SOLN
25.0000 ug | INTRAMUSCULAR | Status: DC | PRN
  Filled 2018-04-25: qty 1

## 2018-04-25 MED ORDER — LACTATED RINGERS IV SOLN
INTRAVENOUS | Status: DC
  Administered 2018-04-25 (×2): via INTRAVENOUS
  Filled 2018-04-25: qty 1000

## 2018-04-25 MED ORDER — FENTANYL CITRATE (PF) 100 MCG/2ML IJ SOLN
INTRAMUSCULAR | Status: DC | PRN
  Administered 2018-04-25 (×2): 50 ug via INTRAVENOUS

## 2018-04-25 MED ORDER — ACETAMINOPHEN 500 MG PO TABS
1000.0000 mg | ORAL_TABLET | Freq: Once | ORAL | Status: AC
  Administered 2018-04-25: 1000 mg via ORAL
  Filled 2018-04-25: qty 2

## 2018-04-25 SURGICAL SUPPLY — 24 items
BLADE SURG 15 STRL LF DISP TIS (BLADE) ×1 IMPLANT
BLADE SURG 15 STRL SS (BLADE) ×2
CANISTER SUCT 3000ML PPV (MISCELLANEOUS) IMPLANT
CANISTER SUCTION 1200CC (MISCELLANEOUS) IMPLANT
CATH ROBINSON RED A/P 16FR (CATHETERS) IMPLANT
CATH SILICONE 16FRX5CC (CATHETERS) ×3 IMPLANT
COVER WAND RF STERILE (DRAPES) ×3 IMPLANT
GAUZE 4X4 16PLY RFD (DISPOSABLE) ×3 IMPLANT
GLOVE BIO SURGEON STRL SZ7.5 (GLOVE) IMPLANT
GLOVE SURG SS PI 7.5 STRL IVOR (GLOVE) ×6 IMPLANT
GOWN STRL REUS W/TWL XL LVL3 (GOWN DISPOSABLE) ×6 IMPLANT
KIT TURNOVER CYSTO (KITS) ×3 IMPLANT
NEEDLE HYPO 25X1 1.5 SAFETY (NEEDLE) ×3 IMPLANT
NS IRRIG 500ML POUR BTL (IV SOLUTION) IMPLANT
PACK PERINEAL COLD (PAD) ×3 IMPLANT
PACK VAGINAL WOMENS (CUSTOM PROCEDURE TRAY) ×3 IMPLANT
PAD OB MATERNITY 4.3X12.25 (PERSONAL CARE ITEMS) IMPLANT
SCOPETTES 8  STERILE (MISCELLANEOUS) ×2
SCOPETTES 8 STERILE (MISCELLANEOUS) ×1 IMPLANT
SUT VIC AB 2-0 CT1 (SUTURE) ×3 IMPLANT
SUT VIC AB 3-0 SH 27 (SUTURE) ×2
SUT VIC AB 3-0 SH 27X BRD (SUTURE) ×1 IMPLANT
TOWEL OR 17X24 6PK STRL BLUE (TOWEL DISPOSABLE) ×6 IMPLANT
WATER STERILE IRR 500ML POUR (IV SOLUTION) ×3 IMPLANT

## 2018-04-25 NOTE — Progress Notes (Signed)
Laurie Hoffman  female MEDICAL RECORD [id number] DATE OF BIRTH: February 24, 1966 PHYSICIAN: Marti Sleigh, M.D  04/25/2018   OPERATIVE REPORT  PREOPERATIVE DIAGNOSIS: Exophytic vulvar lesion  POSTOPERATIVE DIAGNOSIS: Same  PROCEDURE: Partial simple vulvectomy  SURGEON: Marti Sleigh, M.D   ANESTHESIA: LMA ESTIMATED BLOOD LOSS: Minimal SURGICAL FINDINGS: On the left anterior labia majora was a 3 to 4 cm exophytic lesion.  There were no palpable inguinal adenopathy.  PROCEDURE: Patient brought the operating room and after satisfactory attainment of anesthesia was placed in a modified lithotomy position in [place].  The vulva and perineum were prepped with Betadine and the bladder emptied with straight catheter.  The patient was draped.  Surgical timeout was taken.  10 mL of 1% lidocaine was injected for local anesthesia.  An elliptical incision was created around the lesion with a 1 cm margin in all directions.  The incision went deep into the subcutaneous fat.  The specimen was marked at 12:00 with a suture.  Subcutaneous region was made hemostatic with cautery and then reapproximated with interrupted 0 Vicryl sutures.  The skin was approximated with a running subcuticular suture of 3-0 Vicryl.  The patient was awakened from anesthesia and taken to the recovery room in satisfactory condition.  Sponge needle and instrument counts correct x2.  Marti Sleigh, M.D

## 2018-04-25 NOTE — Anesthesia Procedure Notes (Signed)
Procedure Name: LMA Insertion Date/Time: 04/25/2018 1:00 PM Performed by: Wanita Chamberlain, CRNA Pre-anesthesia Checklist: Patient identified, Emergency Drugs available, Suction available, Patient being monitored and Timeout performed Patient Re-evaluated:Patient Re-evaluated prior to induction Oxygen Delivery Method: Circle system utilized Preoxygenation: Pre-oxygenation with 100% oxygen Induction Type: IV induction Ventilation: Mask ventilation without difficulty LMA: LMA inserted LMA Size: 4.0 Number of attempts: 1 Placement Confirmation: breath sounds checked- equal and bilateral,  CO2 detector and positive ETCO2 Tube secured with: Tape Dental Injury: Teeth and Oropharynx as per pre-operative assessment

## 2018-04-25 NOTE — Interval H&P Note (Signed)
History and Physical Interval Note:  04/25/2018 12:39 PM  Laurie Hoffman  has presented today for surgery, with the diagnosis of VULVAR LESION  The various methods of treatment have been discussed with the patient and family. After consideration of risks, benefits and other options for treatment, the patient has consented to  Procedure(s): WIDE EXCISION VULVECTOMY (N/A) as a surgical intervention .  The patient's history has been reviewed, patient examined, no change in status, stable for surgery.  I have reviewed the patient's chart and labs.  Questions were answered to the patient's satisfaction.     Marti Sleigh

## 2018-04-25 NOTE — Anesthesia Preprocedure Evaluation (Addendum)
Anesthesia Evaluation  Patient identified by MRN, date of birth, ID band Patient awake    Reviewed: Allergy & Precautions, NPO status , Patient's Chart, lab work & pertinent test results  Airway Mallampati: II  TM Distance: >3 FB Neck ROM: Full    Dental  (+) Teeth Intact, Dental Advisory Given   Pulmonary former smoker,    Pulmonary exam normal breath sounds clear to auscultation       Cardiovascular hypertension, Pt. on medications Normal cardiovascular exam Rhythm:Regular Rate:Normal     Neuro/Psych negative neurological ROS  negative psych ROS   GI/Hepatic negative GI ROS, Neg liver ROS,   Endo/Other  diabetes, Type 2, Oral Hypoglycemic AgentsMorbid obesity  Renal/GU negative Renal ROS   Vulvar lesion     Musculoskeletal negative musculoskeletal ROS (+)   Abdominal   Peds  Hematology negative hematology ROS (+)   Anesthesia Other Findings Day of surgery medications reviewed with the patient.  Reproductive/Obstetrics                             Anesthesia Physical Anesthesia Plan  ASA: III  Anesthesia Plan: General   Post-op Pain Management:    Induction: Intravenous  PONV Risk Score and Plan: 4 or greater and Scopolamine patch - Pre-op, Midazolam, Dexamethasone and Ondansetron  Airway Management Planned: LMA  Additional Equipment:   Intra-op Plan:   Post-operative Plan: Extubation in OR  Informed Consent: I have reviewed the patients History and Physical, chart, labs and discussed the procedure including the risks, benefits and alternatives for the proposed anesthesia with the patient or authorized representative who has indicated his/her understanding and acceptance.   Dental advisory given  Plan Discussed with: CRNA  Anesthesia Plan Comments:         Anesthesia Quick Evaluation

## 2018-04-25 NOTE — Transfer of Care (Signed)
Immediate Anesthesia Transfer of Care Note  Patient: Laurie Hoffman  Procedure(s) Performed: WIDE EXCISION VULVECTOMY (N/A )  Patient Location: PACU  Anesthesia Type:General  Level of Consciousness: awake, alert , oriented and patient cooperative  Airway & Oxygen Therapy: Patient Spontanous Breathing and Patient connected to nasal cannula oxygen  Post-op Assessment: Report given to RN and Post -op Vital signs reviewed and stable  Post vital signs: Reviewed and stable  Last Vitals:  Vitals Value Taken Time  BP    Temp    Pulse    Resp    SpO2      Last Pain:  Vitals:   04/25/18 1116  TempSrc:   PainSc: 0-No pain      Patients Stated Pain Goal: 4 (67/67/20 9470)  Complications: No apparent anesthesia complications

## 2018-04-25 NOTE — Anesthesia Postprocedure Evaluation (Signed)
Anesthesia Post Note  Patient: Laurie Hoffman  Procedure(s) Performed: WIDE EXCISION VULVECTOMY (N/A )     Patient location during evaluation: PACU Anesthesia Type: General Level of consciousness: awake and alert, oriented and awake Pain management: pain level controlled Vital Signs Assessment: post-procedure vital signs reviewed and stable Respiratory status: spontaneous breathing, nonlabored ventilation and respiratory function stable Cardiovascular status: blood pressure returned to baseline and stable Postop Assessment: no apparent nausea or vomiting Anesthetic complications: no    Last Vitals:  Vitals:   04/25/18 1400 04/25/18 1415  BP: 117/69 120/70  Pulse: 86 76  Resp: 18 (!) 22  Temp:    SpO2: 93% 97%    Last Pain:  Vitals:   04/25/18 1415  TempSrc:   PainSc: 0-No pain                 Catalina Gravel

## 2018-04-25 NOTE — Discharge Instructions (Signed)
We will contact you with the pathology report in approximately 48 hours.  Return to see me in 4 to 6 weeks.  Call your surgeon if you experience:   1.  Fever over 101.0. 2.  Inability to urinate. 3.  Nausea and/or vomiting. 4.  Extreme swelling or bruising at the surgical site. 5.  Continued bleeding from the incision. 6.  Increased pain, redness or drainage from the incision. 7.  Problems related to your pain medication. 8.  Any problems and/or concerns    No advil, aleve, motrin, ibuprofen until 7 pm tonight   Post Anesthesia Home Care Instructions  Activity: Get plenty of rest for the remainder of the day. A responsible adult should stay with you for 24 hours following the procedure.  For the next 24 hours, DO NOT: -Drive a car -Paediatric nurse -Drink alcoholic beverages -Take any medication unless instructed by your physician -Make any legal decisions or sign important papers.  Meals: Start with liquid foods such as gelatin or soup. Progress to regular foods as tolerated. Avoid greasy, spicy, heavy foods. If nausea and/or vomiting occur, drink only clear liquids until the nausea and/or vomiting subsides. Call your physician if vomiting continues.  Special Instructions/Symptoms: Your throat may feel dry or sore from the anesthesia or the breathing tube placed in your throat during surgery. If this causes discomfort, gargle with warm salt water. The discomfort should disappear within 24 hours.  If you had a scopolamine patch placed behind your ear for the management of post- operative nausea and/or vomiting:  1. The medication in the patch is effective for 72 hours, after which it should be removed.  Wrap patch in a tissue and discard in the trash. Wash hands thoroughly with soap and water. 2. You may remove the patch earlier than 72 hours if you experience unpleasant side effects which may include dry mouth, dizziness or visual disturbances. 3. Avoid touching the patch.  Wash your hands with soap and water after contact with the patch.

## 2018-04-25 NOTE — Procedures (Signed)
Laurie Hoffman  female MEDICAL RECORD [id number] DATE OF BIRTH: 09/18/1965 PHYSICIAN: Marti Sleigh, M.D  04/25/2018   OPERATIVE REPORT  PREOPERATIVE DIAGNOSIS: Exophytic vulvar lesion  POSTOPERATIVE DIAGNOSIS: Same  PROCEDURE: Partial simple vulvectomy  SURGEON: Marti Sleigh, M.D   ANESTHESIA: LMA ESTIMATED BLOOD LOSS: Minimal SURGICAL FINDINGS: On the left anterior labia majora was a 3 to 4 cm exophytic lesion.  There were no palpable inguinal adenopathy.  PROCEDURE: Patient brought the operating room and after satisfactory attainment of anesthesia was placed in a modified lithotomy position in [place].  The vulva and perineum were prepped with Betadine and the bladder emptied with straight catheter.  The patient was draped.  Surgical timeout was taken.  10 mL of 1% lidocaine was injected for local anesthesia.  An elliptical incision was created around the lesion with a 1 cm margin in all directions.  The incision went deep into the subcutaneous fat.  The specimen was marked at 12:00 with a suture.  Subcutaneous region was made hemostatic with cautery and then reapproximated with interrupted 0 Vicryl sutures.  The skin was approximated with a running subcuticular suture of 3-0 Vicryl.  The patient was awakened from anesthesia and taken to the recovery room in satisfactory condition.  Sponge needle and instrument counts correct x2.  Marti Sleigh, M.D

## 2018-04-26 ENCOUNTER — Ambulatory Visit: Payer: BLUE CROSS/BLUE SHIELD | Admitting: Family Medicine

## 2018-04-26 ENCOUNTER — Encounter (HOSPITAL_BASED_OUTPATIENT_CLINIC_OR_DEPARTMENT_OTHER): Payer: Self-pay | Admitting: Gynecology

## 2018-04-28 ENCOUNTER — Telehealth: Payer: Self-pay | Admitting: Gynecologic Oncology

## 2018-04-28 NOTE — Telephone Encounter (Signed)
Notified patient of path results.  States she is doing well post-op.  Appt made for patient to meet with Dr. Gerarda Fraction on Dec 20 to discuss next steps.  No concerns voiced. Advised to call for any needs or concerns.

## 2018-04-29 ENCOUNTER — Emergency Department (INDEPENDENT_AMBULATORY_CARE_PROVIDER_SITE_OTHER)
Admission: EM | Admit: 2018-04-29 | Discharge: 2018-04-29 | Disposition: A | Discharge status: Home / Self Care | Payer: BLUE CROSS/BLUE SHIELD | Source: Home / Self Care

## 2018-04-29 ENCOUNTER — Encounter: Payer: Self-pay | Admitting: Emergency Medicine

## 2018-04-29 DIAGNOSIS — N762 Acute vulvitis: Secondary | ICD-10-CM | POA: Diagnosis not present

## 2018-04-29 DIAGNOSIS — L0390 Cellulitis, unspecified: Secondary | ICD-10-CM

## 2018-04-29 DIAGNOSIS — T8149XA Infection following a procedure, other surgical site, initial encounter: Secondary | ICD-10-CM

## 2018-04-29 HISTORY — DX: Cellulitis, unspecified: L03.90

## 2018-04-29 MED ORDER — AMOXICILLIN-POT CLAVULANATE 875-125 MG PO TABS: 1.0000 | ORAL_TABLET | Freq: Two times a day (BID) | ORAL | 0 refills | Status: AC

## 2018-04-29 NOTE — Discharge Instructions (Signed)
°  Keep area clean with warm water and mild soap. Pat dry.  Try to keep area from rubbing on clothing by wearing loose fitting pants.  Please take your antibiotics as prescribed and call your gynecologist Monday to see if they would like to see you before Friday.

## 2018-04-29 NOTE — ED Triage Notes (Signed)
Patient c/o that she had surgery on Tuesday on a growth on her vulva, now the area is swollen with drainage.

## 2018-04-29 NOTE — ED Provider Notes (Signed)
Laurie [facility]    CSN: [id number] Arrival date & time: 04/29/18  1334     History   Chief Complaint Chief Complaint  Patient presents with  . vaginal drainage    HPI Laurie Hoffman is a 52 y.o. female.   HPI  Laurie Hoffman is a 52 y.o. female presenting to UC with c/o worsening labial pain with bleeding and discharge from an incision site since last night. Pt had a skin lesion surgically removed on Tuesday 04/25/18. She had been doing well until last night. She noticed some bleeding and is concerned the area feels more swollen. Pain is mild, manage by Tylenol.  Denies dysuria. Denies fever or chills. On-call nurse encouraged her to be seen at [facility]. Her next f/u is Friday 12/20.   Past Medical History:  Diagnosis Date  . Dry eye syndrome   . Eczema   . Hypertension   . Macular degeneration   . Obesity   . Seasonal allergies   . Type 2 diabetes mellitus ([place])    followed by pcp  . Vulvar lesion   . Wears glasses     Patient Active Problem List   Diagnosis Date Noted  . Vulvar lesion   . Dry eye syndrome of both eyes 12/06/2016  . Chronic non-seasonal allergic rhinitis 08/16/2016  . Dyspnea and respiratory abnormalities 08/16/2016  . Severe obesity (BMI >= 40) ([place]) 05/18/2013  . MORBID OBESITY 11/14/2009  . Diabetes ([place]) 09/02/2008  . LEG EDEMA, BILATERAL 08/30/2008  . Diabetic retinopathy ([place]) 10/19/2007  . Controlled diabetes mellitus type 2 with complications ([place]) 32/35/5732  . ESSENTIAL HYPERTENSION, BENIGN 07/31/2007    Past Surgical History:  Procedure Laterality Date  . NO PAST SURGERIES    . VULVECTOMY N/A 04/25/2018   Procedure: WIDE EXCISION VULVECTOMY;  Surgeon: Marti Sleigh, MD;  Location: [facility];  Service: Gynecology;  Laterality: N/A;    OB History    Gravida  0   Para  0   Term  0   Preterm  0   AB  0   Living  0     SAB  0   TAB  0   Ectopic  0   Multiple  0   Live Births  0              Home Medications    Prior to Admission medications   Medication Sig Start Date End Date Taking? Authorizing Provider  albuterol (PROVENTIL HFA;VENTOLIN HFA) 108 (90 Base) MCG/ACT inhaler Inhale 1-2 puffs into the lungs every 6 (six) hours as needed for wheezing or shortness of breath. 09/16/17   Hali Marry, MD  amoxicillin-clavulanate (AUGMENTIN) 875-125 MG tablet Take 1 tablet by mouth 2 (two) times daily for 10 days. One po bid x 7 days 04/29/18 05/09/18  Noe Gens, PA-C  aspirin (ASPIRIN CHILDRENS) 81 MG chewable tablet Chew 81 mg by mouth daily.      [provider]  BAYER MICROLET LANCETS lancets Use to check blood sugar twice daily. Dx: E11.8 11/07/17   Hali Marry, MD  Blood Glucose Monitoring Suppl (BAYER CONTOUR MONITOR) w/Device KIT 1 Device by Does not apply route as directed. 06/06/17   Hali Marry, MD  glipiZIDE (GLUCOTROL XL) 2.5 MG 24 hr tablet TAKE 1 TABLET(2.5 MG) BY MOUTH DAILY WITH BREAKFAST Patient taking differently: Take 2.5 mg by mouth daily with breakfast. TAKE 1 TABLET(2.5 MG) BY MOUTH DAILY WITH BREAKFAST 01/24/18   Beatrice Lecher  D, MD  glucose blood (BAYER CONTOUR TEST) test strip Use as instructed 06/06/17   Hali Marry, MD  INVOKAMET [phone] MG TABS TAKE 1 TABLET BY MOUTH TWICE DAILY Patient taking differently: Take 1 tablet by mouth 2 (two) times daily.  12/26/17   Hali Marry, MD  montelukast (SINGULAIR) 10 MG tablet Take 1 tablet (10 mg total) by mouth at bedtime. 11/01/17   Hali Marry, MD  Multiple Vitamins-Minerals (WOMENS MULTIVITAMIN) TABS Take 1 tablet by mouth daily.    [provider]  NOVOTWIST 32G X 5 MM MISC USE AS DIRECTED 01/09/18   Hali Marry, MD  nystatin (MYCOSTATIN/NYSTOP) powder Apply topically 4 (four) times daily. Patient taking differently: Apply topically as needed.  03/01/18   Hali Marry, MD  Omega-3 Fatty Acids (FISH OIL) 1000 MG  CAPS Take 2 capsules by mouth daily.    [provider]  simvastatin (ZOCOR) 20 MG tablet TAKE 1 TABLET(20 MG) BY MOUTH DAILY AT 6 PM Patient taking differently: Take 20 mg by mouth every evening.  11/22/17   Hali Marry, MD  telmisartan-hydrochlorothiazide (MICARDIS HCT) 40-12.5 MG tablet TAKE 1 TABLET BY MOUTH DAILY Patient taking differently: Take 1 tablet by mouth every morning.  11/14/17   Hali Marry, MD  VICTOZA 18 MG/3ML SOPN ADMINISTER 2.4 MG UNDER THE SKIN DAILY Patient taking differently: Inject 1.8 mg into the skin every morning.  01/02/18   Hali Marry, MD  XIIDRA 5 % SOLN Place 1 drop into both eyes 2 (two) times daily.  04/28/17   [provider]    Family History Family History  Problem Relation Age of Onset  . Diabetes Mother        Insulin  . Heart attack Mother   . Cancer Father 54       melanoma  . Diabetes Sister        Insulin  . Hypertension Sister     Social History Social History   Tobacco Use  . Smoking status: Former Smoker    Years: 2.00    Types: Cigarettes    Last attempt to quit: 05/17/1986    Years since quitting: 31.9  . Smokeless tobacco: Never Used  Substance Use Topics  . Alcohol use: Yes    Comment: very rare  . Drug use: No     Allergies   Atorvastatin and Latex   Review of Systems Review of Systems  Constitutional: Negative for chills and fever.  Genitourinary: Negative for dysuria.  Musculoskeletal: Negative for arthralgias and myalgias.  Skin: Positive for color change and wound.     Physical Exam Triage Vital Signs ED Triage Vitals [04/29/18 1419]  Enc Vitals Group     BP 119/81     Pulse Rate 97     Resp      Temp 98.2 F (36.8 C)     Temp Source Oral     SpO2 97 %     Weight 257 lb 12 oz (116.9 kg)     Height 5' 3.75" (1.619 m)     Head Circumference      Peak Flow      Pain Score 2     Pain Loc      Pain Edu?      Excl. in [place]?    No data found.  Updated  Vital Signs BP 119/81 (BP Location: Right Arm)   Pulse 97   Temp 98.2 F (36.8 C) (Oral)  Ht 5' 3.75" (1.619 m)   Wt 257 lb 12 oz (116.9 kg)   SpO2 97%   BMI 44.59 kg/m   Visual Acuity Right Eye Distance:   Left Eye Distance:   Bilateral Distance:    Right Eye Near:   Left Eye Near:    Bilateral Near:     Physical Exam Vitals signs and nursing note reviewed. Exam conducted with a chaperone present.  Constitutional:      Appearance: She is well-developed.  HENT:     Head: Normocephalic and atraumatic.  Neck:     Musculoskeletal: Normal range of motion.  Cardiovascular:     Rate and Rhythm: Normal rate.  Pulmonary:     Effort: Pulmonary effort is normal.  Genitourinary:    Labia:        Left: Tenderness (mild) present.     Musculoskeletal: Normal range of motion.  Skin:    General: Skin is warm and dry.  Neurological:     Mental Status: She is alert and oriented to person, place, and time.  Psychiatric:        Behavior: Behavior normal.      UC Treatments / Results  Labs (all labs ordered are listed, but only abnormal results are displayed) Labs Reviewed - No data to display  EKG None  Radiology No results found.  Procedures Procedures (including critical care time)  Medications Ordered in UC Medications - No data to display  Initial Impression / Assessment and Plan / UC Course  I have reviewed the triage vital signs and the nursing notes.  Pertinent labs & imaging results that were available during my care of the patient were reviewed by me and considered in my medical decision making (see chart for details).     Exam concerning for infection of incision site. Will start pt on Augmentin Home care info provided.  Final Clinical Impressions(s) / UC Diagnoses   Final diagnoses:  Cellulitis of labia majora  Infected incision     Discharge Instructions      Keep area clean with warm water and mild soap. Pat dry.  Try to keep area from  rubbing on clothing by wearing loose fitting pants.  Please take your antibiotics as prescribed and call your gynecologist Monday to see if they would like to see you before Friday.     ED Prescriptions    Medication Sig Dispense Auth. Provider   amoxicillin-clavulanate (AUGMENTIN) 875-125 MG tablet Take 1 tablet by mouth 2 (two) times daily for 10 days. One po bid x 7 days 20 tablet Noe Gens, [place]     Controlled Substance Prescriptions [place] Controlled Substance Registry consulted? Not Applicable   Tyrell Antonio 04/29/18 1642

## 2018-04-30 ENCOUNTER — Encounter: Payer: Self-pay | Admitting: Obstetrics

## 2018-05-01 ENCOUNTER — Telehealth: Payer: Self-pay

## 2018-05-01 NOTE — Telephone Encounter (Signed)
Outgoing call per Joylene John NP regarding received team health call center update from pt regarding having swelling, pain, bleeding from area she had procedure for removal of growth on vulva.  Pt reports she was originally told that she could apply some neosporin to area but she decided to go to [facility] and they prescribed antibiotics and now the area feels much better, hardly any pain, swelling decreased, and no angry red anymore.  Pt reports is better and will keep f/u appt on this Friday.  Per Melissa NP- if not improving, call back, and we will see you sooner.  Pt voiced understanding.  No other needs per pt at this time.

## 2018-05-03 NOTE — Progress Notes (Addendum)
Progress Note : Established Patient Follow-Up   Laurie Hoffman 52 y.o. female  Chief Complaint  Patient presents with  . Vulvar Cancer    At least Stage IB    Assessment : At least Stage IB invasive squamous cell carcinoma.    Plan:  Plan per Dr. C-P was if this was invasive carcinoma she should have a second procedure (left inguinal lymphadenectomy) We have discussed the reason behind these recommendations and she would like to proceed. We reviewed she needs to have her diabetes under control and that wound healing may be an issue. Risks, benefits, and alternatives of surgery reviewed including but not limited to wound infection, bleeding, neuropathy. Discussed postoperative care.     HPI: 52 y.o. white married female gravida 0 seen in consultation request of Dr.Metheney regarding management of a vulvar lesion.  Apparently lesion is been present for a number of years.  The patient reports that it is on occasion been larger and then smaller.  She has some pain associated with the lesion.  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  Dr. Fermin Schwab saw the patient and discussed he was concerned for invasive carcinoma. He scheduled the patient for a WLE for therapeutic and diagnostic purposes.  Interval History She underwent surgery with Dr.Clarke-Pearson Final pathology: Vulva, vulvectomy, left anterior - INVASIVE SQUAMOUS CELL CARCINOMA, WELL DIFFERENTIATED, SPANNING 2.2 CM. - TUMOR INVADES 3 MM. - RESECTION MARGINS ARE NEGATIVE FOR DYSPLASIA AND CARCINOMA. 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 - NO LYMPHOVASCULAR INVASION IDENTIFIED. - SEE ONCOLOGY TABLE. Microscopic Comment VULVA: Procedure: Vulvectomy. Tumor Site: Left anterior. Tumor Size: 2.2 cm. Tumor Focality: Unifocal. Histologic Type: Invasive squamous cell carcinoma. Histologic Grade: G1, well differentiated. Depth of Invasion (Note E): Specify depth of invasion (millimeters):  3 mm Other Tissue/ Organ Involvement: N/A. Margins: Peripheral Margin: Negative (> 85m). Deep Margin: Negative (>5 mm). Lymphovascular Invasion: Not identified. Regional Lymph Nodes: No lymph nodes submitted or found Pathologic Stage Classification (pTNM, AJCC 8th Edition): pT1b, pNX Representative Tumor Block: 1E Comment(s): None. Gross Received in formalin is a 4.2 x 3.7 cm portion of skin and subcutaneous tissue excised to a depth of up to 1.2 cm. There is a central 2.2 x 2.2 x 1.6 cm firm tan gray granular nodule.  She did go to urgent care for the wound and was given antibiotics. States still a bit of drainage. No fevers.   Patient denies any past gynecologic history although she went through menopause at age 52  This is the same age as her mother went through menopause.  She claims Pap smears have been normal in the past.  She does have a recent Pap smear that was normal.  Review of Systems  HENT:   Positive for tinnitus.   All other systems reviewed and are negative.   Vitals: Blood pressure (!) 135/95, pulse 83, temperature 97.8 F (36.6 C), temperature source Oral, resp. rate 20, height 5' 3.75" (1.619 m), weight 250 lb 12.8 oz (113.8 kg), SpO2 99 %.  Physical Exam: General :  Well developed, 52y.o., female in no apparent distress HEENT:  Normocephalic/atraumatic, symmetric, EOMI, eyelids normal Neck:   Supple, no masses.  Lymphatics:  No cervical/ submandibular/ supraclavicular/ infraclavicular/ inguinal adenopathy Respiratory:  Respirations unlabored, no use of accessory muscles CV:   Deferred Breast:  Deferred Musculoskeletal: No CVA tenderness, normal muscle strength. Abdomen:  Obese with pannus. Soft, non-tender and nondistended. No evidence of hernia. No masses. Extremities:  No lymphedema, no erythema, non-tender. Skin:   Normal inspection Neuro/Psych:  No focal motor deficit, no abnormal mental status. Normal gait. Normal affect. Alert and oriented to person,  place, and time  Genito Urinary: Vulva: Wound healing well. Mild exudate, no obvious infection. Normal external female genitalia.  Bladder/urethra: Urethral meatus normal in size and location. No lesions or   masses, well supported bladder   PreDx Exam by Dr. Wayland Salinas.  Laurie Hoffman: Normal female, there is a 3 cm exophytic lesion on the left anterior labia majora.  This is not fixed.  Vagina: Normal, no lesions   Urethra and Bladder: Normal, non-tender   Cervix: Normal  Uterus: Difficult to evaluate given the patient's habitus.     Bi-manual examination: Non-tender; no adenxal masses or nodularity   Rectal: normal sphincter tone, no masses, no blood       Allergies  Allergen Reactions  . Atorvastatin Other (See Comments)    Leg weakness  . Latex Rash    Past Medical History:  Diagnosis Date  . Dry eye syndrome   . Eczema   . Hypertension   . Macular degeneration   . Obesity   . Seasonal allergies   . Type 2 diabetes mellitus ([place])    followed by pcp  . Vulvar cancer ([place])    Stage IB  . Wears glasses     Past Surgical History:  Procedure Laterality Date  . NO PAST SURGERIES    . VULVECTOMY N/A 04/25/2018   Procedure: WIDE EXCISION VULVECTOMY;  Surgeon: Marti Sleigh, MD;  [facility];  Service: Gynecology;  Laterality: N/A;    Current Outpatient Medications  Medication Sig Dispense Refill  . albuterol (PROVENTIL HFA;VENTOLIN HFA) 108 (90 Base) MCG/ACT inhaler Inhale 1-2 puffs into the lungs every 6 (six) hours as needed for wheezing or shortness of breath. 1 Inhaler 0  . amoxicillin-clavulanate (AUGMENTIN) 875-125 MG tablet Take 1 tablet by mouth 2 (two) times daily for 10 days. One po bid x 7 days 20 tablet 0  . BAYER MICROLET LANCETS lancets Use to check blood sugar twice daily. Dx: E11.8 100 each 12  . Blood Glucose Monitoring Suppl (BAYER CONTOUR MONITOR) w/Device KIT 1 Device by Does not apply route as directed. 1 kit 0  . glipiZIDE  (GLUCOTROL XL) 2.5 MG 24 hr tablet TAKE 1 TABLET(2.5 MG) BY MOUTH DAILY WITH BREAKFAST (Patient taking differently: Take 2.5 mg by mouth daily with breakfast. TAKE 1 TABLET(2.5 MG) BY MOUTH DAILY WITH BREAKFAST) 90 tablet 3  . glucose blood (BAYER CONTOUR TEST) test strip Use as instructed 100 each 12  . INVOKAMET [phone] MG TABS TAKE 1 TABLET BY MOUTH TWICE DAILY (Patient taking differently: Take 1 tablet by mouth 2 (two) times daily. ) 60 tablet 5  . montelukast (SINGULAIR) 10 MG tablet Take 1 tablet (10 mg total) by mouth at bedtime. 90 tablet 3  . Multiple Vitamins-Minerals (WOMENS MULTIVITAMIN) TABS Take 1 tablet by mouth daily.    Marland Kitchen NOVOTWIST 32G X 5 MM MISC USE AS DIRECTED 100 each 11  . nystatin (MYCOSTATIN/NYSTOP) powder Apply topically 4 (four) times daily. (Patient taking differently: Apply topically as needed. ) 60 g 2  . Omega-3 Fatty Acids (FISH OIL) 1000 MG CAPS Take 2 capsules by mouth daily.    . simvastatin (ZOCOR) 20 MG tablet TAKE 1 TABLET(20 MG) BY MOUTH DAILY AT 6 PM (Patient taking differently: Take 20 mg by mouth every evening. ) 90 tablet 3  . telmisartan-hydrochlorothiazide (  MICARDIS HCT) 40-12.5 MG tablet TAKE 1 TABLET BY MOUTH DAILY (Patient taking differently: Take 1 tablet by mouth every morning. ) 90 tablet 1  . VICTOZA 18 MG/3ML SOPN ADMINISTER 2.4 MG UNDER THE SKIN DAILY (Patient taking differently: Inject 1.8 mg into the skin every morning. ) 12 mL 6  . XIIDRA 5 % SOLN Place 1 drop into both eyes 2 (two) times daily.   11   No current facility-administered medications for this visit.     Social History   Socioeconomic History  . Marital status: Married    Spouse name: Not on file  . Number of children: Not on file  . Years of education: Not on file  . Highest education level: Not on file  Occupational History  . Not on file  Social Needs  . Financial resource strain: Not on file  . Food insecurity:    Worry: Not on file    Inability: Not on file  .  Transportation needs:    Medical: Not on file    Non-medical: Not on file  Tobacco Use  . Smoking status: Former Smoker    Years: 2.00    Types: Cigarettes    Last attempt to quit: 05/17/1986    Years since quitting: 31.9  . Smokeless tobacco: Never Used  Substance and Sexual Activity  . Alcohol use: Yes    Comment: very rare  . Drug use: No  . Sexual activity: Not on file  Lifestyle  . Physical activity:    Days per week: Not on file    Minutes per session: Not on file  . Stress: Not on file  Relationships  . Social connections:    Talks on phone: Not on file    Gets together: Not on file    Attends religious service: Not on file    Active member of club or organization: Not on file    Attends meetings of clubs or organizations: Not on file    Relationship status: Not on file  . Intimate partner violence:    Fear of current or ex partner: Not on file    Emotionally abused: Not on file    Physically abused: Not on file    Forced sexual activity: Not on file  Other Topics Concern  . Not on file  Social History Narrative  . Not on file    Family History  Problem Relation Age of Onset  . Diabetes Mother        Insulin  . Heart attack Mother   . Cancer Father 47       melanoma  . Diabetes Sister        Insulin  . Hypertension Sister       [name], MD 05/05/2018, 12:39 PM   15  minutes of direct face to face counseling time was spent with the patient. This included discussion about prognosis, therapy recommendations including lymph node dissection, and postoperative side effects that are beyond the scope of routine postoperative care.

## 2018-05-03 NOTE — H&P (View-Only) (Signed)
[place] [facility]    Progress Note : Established Patient Follow-Up   Laurie Hoffman 52 y.o. female  Chief Complaint  Patient presents with  . Vulvar Cancer    At least Stage IB    Assessment : At least Stage IB invasive squamous cell carcinoma.    Plan:  Plan per Dr. C-P was if this was invasive carcinoma she should have a second procedure (left inguinal lymphadenectomy) We have discussed the reason behind these recommendations and she would like to proceed. We reviewed she needs to have her diabetes under control and that wound healing may be an issue. Risks, benefits, and alternatives of surgery reviewed including but not limited to wound infection, bleeding, neuropathy. Discussed postoperative care.     HPI: 52 y.o. white married female gravida 0 seen in consultation request of Dr.Metheney regarding management of a vulvar lesion.  Apparently lesion is been present for a number of years.  The patient reports that it is on occasion been larger and then smaller.  She has some pain associated with the lesion.  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  Dr. Fermin Schwab saw the patient and discussed he was concerned for invasive carcinoma. He scheduled the patient for a WLE for therapeutic and diagnostic purposes.  Interval History She underwent surgery with Dr.Clarke-Pearson Final pathology: Vulva, vulvectomy, left anterior - INVASIVE SQUAMOUS CELL CARCINOMA, WELL DIFFERENTIATED, SPANNING 2.2 CM. - TUMOR INVADES 3 MM. - RESECTION MARGINS ARE NEGATIVE FOR DYSPLASIA AND CARCINOMA. 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 - NO LYMPHOVASCULAR INVASION IDENTIFIED. - SEE ONCOLOGY TABLE. Microscopic Comment VULVA: Procedure: Vulvectomy. Tumor Site: Left anterior. Tumor Size: 2.2 cm. Tumor Focality: Unifocal. Histologic Type: Invasive squamous cell carcinoma. Histologic Grade: G1, well  differentiated. Depth of Invasion (Note E): Specify depth of invasion (millimeters): 3 mm Other Tissue/ Organ Involvement: N/A. Margins: Peripheral Margin: Negative (> 74m). Deep Margin: Negative (>5 mm). Lymphovascular Invasion: Not identified. Regional Lymph Nodes: No lymph nodes submitted or found Pathologic Stage Classification (pTNM, AJCC 8th Edition): pT1b, pNX Representative Tumor Block: 1E Comment(s): None. Gross Received in formalin is a 4.2 x 3.7 cm portion of skin and subcutaneous tissue excised to a depth of up to 1.2 cm. There is a central 2.2 x 2.2 x 1.6 cm firm tan gray granular nodule.  She did go to urgent care for the wound and was given antibiotics. States still a bit of drainage. No fevers.   Patient denies any past gynecologic history although she went through menopause at age 52  This is the same age as her mother went through menopause.  She claims Pap smears have been normal in the past.  She does have a recent Pap smear that was normal.  Review of Systems  HENT:   Positive for tinnitus.   All other systems reviewed and are negative.   Vitals: Blood pressure (!) 135/95, pulse 83, temperature 97.8 F (36.6 C), temperature source Oral, resp. rate 20, height 5' 3.75" (1.619 m), weight 250 lb 12.8 oz (113.8 kg), SpO2 99 %.  Physical Exam: General :  Well developed, 52y.o., female in no apparent distress HEENT:  Normocephalic/atraumatic, symmetric, EOMI, eyelids normal Neck:   Supple, no masses.  Lymphatics:  No cervical/ submandibular/ supraclavicular/ infraclavicular/ inguinal adenopathy Respiratory:  Respirations unlabored, no use of accessory muscles CV:   Deferred Breast:  Deferred Musculoskeletal: No CVA tenderness, normal muscle strength. Abdomen:  Obese with  pannus. Soft, non-tender and nondistended. No evidence of hernia. No masses. Extremities:  No lymphedema, no erythema, non-tender. Skin:   Normal inspection Neuro/Psych:  No focal motor  deficit, no abnormal mental status. Normal gait. Normal affect. Alert and oriented to person, place, and time  Genito Urinary: Vulva: Wound healing well. Mild exudate, no obvious infection. Normal external female genitalia.  Bladder/urethra: Urethral meatus normal in size and location. No lesions or   masses, well supported bladder   PreDx Exam by Dr. Wayland Salinas.  Laurie Hoffman: Normal female, there is a 3 cm exophytic lesion on the left anterior labia majora.  This is not fixed.  Vagina: Normal, no lesions   Urethra and Bladder: Normal, non-tender   Cervix: Normal  Uterus: Difficult to evaluate given the patient's habitus.     Bi-manual examination: Non-tender; no adenxal masses or nodularity   Rectal: normal sphincter tone, no masses, no blood       Allergies  Allergen Reactions  . Atorvastatin Other (See Comments)    Leg weakness  . Latex Rash    Past Medical History:  Diagnosis Date  . Dry eye syndrome   . Eczema   . Hypertension   . Macular degeneration   . Obesity   . Seasonal allergies   . Type 2 diabetes mellitus ([place])    followed by pcp  . Vulvar cancer ([place])    Stage IB  . Wears glasses     Past Surgical History:  Procedure Laterality Date  . NO PAST SURGERIES    . VULVECTOMY N/A 04/25/2018   Procedure: WIDE EXCISION VULVECTOMY;  Surgeon: Marti Sleigh, MD;  [facility];  Service: Gynecology;  Laterality: N/A;    Current Outpatient Medications  Medication Sig Dispense Refill  . albuterol (PROVENTIL HFA;VENTOLIN HFA) 108 (90 Base) MCG/ACT inhaler Inhale 1-2 puffs into the lungs every 6 (six) hours as needed for wheezing or shortness of breath. 1 Inhaler 0  . amoxicillin-clavulanate (AUGMENTIN) 875-125 MG tablet Take 1 tablet by mouth 2 (two) times daily for 10 days. One po bid x 7 days 20 tablet 0  . BAYER MICROLET LANCETS lancets Use to check blood sugar twice daily. Dx: E11.8 100 each 12  . Blood Glucose Monitoring Suppl (BAYER CONTOUR  MONITOR) w/Device KIT 1 Device by Does not apply route as directed. 1 kit 0  . glipiZIDE (GLUCOTROL XL) 2.5 MG 24 hr tablet TAKE 1 TABLET(2.5 MG) BY MOUTH DAILY WITH BREAKFAST (Patient taking differently: Take 2.5 mg by mouth daily with breakfast. TAKE 1 TABLET(2.5 MG) BY MOUTH DAILY WITH BREAKFAST) 90 tablet 3  . glucose blood (BAYER CONTOUR TEST) test strip Use as instructed 100 each 12  . INVOKAMET [id number] MG TABS TAKE 1 TABLET BY MOUTH TWICE DAILY (Patient taking differently: Take 1 tablet by mouth 2 (two) times daily. ) 60 tablet 5  . montelukast (SINGULAIR) 10 MG tablet Take 1 tablet (10 mg total) by mouth at bedtime. 90 tablet 3  . Multiple Vitamins-Minerals (WOMENS MULTIVITAMIN) TABS Take 1 tablet by mouth daily.    Marland Kitchen NOVOTWIST 32G X 5 MM MISC USE AS DIRECTED 100 each 11  . nystatin (MYCOSTATIN/NYSTOP) powder Apply topically 4 (four) times daily. (Patient taking differently: Apply topically as needed. ) 60 g 2  . Omega-3 Fatty Acids (FISH OIL) 1000 MG CAPS Take 2 capsules by mouth daily.    . simvastatin (ZOCOR) 20 MG tablet TAKE 1 TABLET(20 MG) BY MOUTH DAILY AT 6 PM (Patient taking differently: Take  20 mg by mouth every evening. ) 90 tablet 3  . telmisartan-hydrochlorothiazide (MICARDIS HCT) 40-12.5 MG tablet TAKE 1 TABLET BY MOUTH DAILY (Patient taking differently: Take 1 tablet by mouth every morning. ) 90 tablet 1  . VICTOZA 18 MG/3ML SOPN ADMINISTER 2.4 MG UNDER THE SKIN DAILY (Patient taking differently: Inject 1.8 mg into the skin every morning. ) 12 mL 6  . XIIDRA 5 % SOLN Place 1 drop into both eyes 2 (two) times daily.   11   No current facility-administered medications for this visit.     Social History   Socioeconomic History  . Marital status: Married    Spouse name: Not on file  . Number of children: Not on file  . Years of education: Not on file  . Highest education level: Not on file  Occupational History  . Not on file  Social Needs  . Financial resource strain:  Not on file  . Food insecurity:    Worry: Not on file    Inability: Not on file  . Transportation needs:    Medical: Not on file    Non-medical: Not on file  Tobacco Use  . Smoking status: Former Smoker    Years: 2.00    Types: Cigarettes    Last attempt to quit: 05/17/1986    Years since quitting: 31.9  . Smokeless tobacco: Never Used  Substance and Sexual Activity  . Alcohol use: Yes    Comment: very rare  . Drug use: No  . Sexual activity: Not on file  Lifestyle  . Physical activity:    Days per week: Not on file    Minutes per session: Not on file  . Stress: Not on file  Relationships  . Social connections:    Talks on phone: Not on file    Gets together: Not on file    Attends religious service: Not on file    Active member of club or organization: Not on file    Attends meetings of clubs or organizations: Not on file    Relationship status: Not on file  . Intimate partner violence:    Fear of current or ex partner: Not on file    Emotionally abused: Not on file    Physically abused: Not on file    Forced sexual activity: Not on file  Other Topics Concern  . Not on file  Social History Narrative  . Not on file    Family History  Problem Relation Age of Onset  . Diabetes Mother        Insulin  . Heart attack Mother   . Cancer Father 55       melanoma  . Diabetes Sister        Insulin  . Hypertension Sister       [name], MD 05/05/2018, 12:39 PM   15  minutes of direct face to face counseling time was spent with the patient. This included discussion about prognosis, therapy recommendations including lymph node dissection, and postoperative side effects that are beyond the scope of routine postoperative care.

## 2018-05-05 ENCOUNTER — Encounter (HOSPITAL_COMMUNITY): Payer: Self-pay

## 2018-05-05 ENCOUNTER — Encounter: Payer: Self-pay | Admitting: Family Medicine

## 2018-05-05 ENCOUNTER — Inpatient Hospital Stay: Payer: BLUE CROSS/BLUE SHIELD | Attending: Gynecology | Admitting: Obstetrics

## 2018-05-05 ENCOUNTER — Encounter: Payer: Self-pay | Admitting: Obstetrics

## 2018-05-05 VITALS — BP 135/95 | HR 83 | Temp 97.8°F | Resp 20 | Ht 63.75 in | Wt 250.8 lb

## 2018-05-05 DIAGNOSIS — Z9889 Other specified postprocedural states: Secondary | ICD-10-CM | POA: Diagnosis not present

## 2018-05-05 DIAGNOSIS — D071 Carcinoma in situ of vulva: Secondary | ICD-10-CM | POA: Diagnosis not present

## 2018-05-05 NOTE — Pre-Procedure Instructions (Signed)
EKG 04/25/2018 in epic. 

## 2018-05-05 NOTE — Patient Instructions (Addendum)
Your procedure is scheduled on: Thursday, Dec. 26, 2019   Surgery Time:  2:00PM-4:00PM   Report to [facility]  Entrance    Report to admitting at [facility]   Call this number if you have problems the morning of surgery [phone]   Do not eat food:After Midnight. May have liquids until 8:00AM day of surgery  CLEAR LIQUID DIET  Foods Allowed                                                                     Foods Excluded  Water, Black Coffee and tea, regular and decaf                             liquids that you cannot  Plain Jell-O in any flavor                                             see through such as: Fruit ices (not with fruit pulp)                                     milk, soups, orange juice  Iced Popsicles                                    All solid food Carbonated beverages, regular and diet                                    Cranberry, grape and apple juices Sports drinks like Gatorade Lightly seasoned clear broth or consume(fat free) Sugar, honey syrup  Sample Menu Breakfast                                Lunch                                     Supper Cranberry juice                    Beef broth                            Chicken broth Jell-O                                     Grape juice                           Apple juice Coffee or tea  Jell-O                                      Popsicle                                                Coffee or tea                        Coffee or tea    Brush your teeth the morning of surgery.   Do NOT smoke after Midnight   Take these medicines the morning of surgery with A SIP OF WATER:  None   May use eye drops per normal routine.   May use Flonase if needed day of surgery   Bring Asthma Inhaler day of surgery  DO NOT TAKE ANY DIABETIC MEDICATIONS DAY OF YOUR SURGERY                               You may not have any metal on your body including hair pins,  jewelry, and body piercings             Do not wear make-up, lotions, powders, perfumes/cologne, or deodorant             Do not wear nail polish.  Do not shave  48 hours prior to surgery.              Do not bring valuables to the hospital. [place].   Contacts, dentures or bridgework may not be worn into surgery.   Leave suitcase in the car. After surgery it may be brought to your room.   Special Instructions: Bring a copy of your healthcare power of attorney and living will documents the day of surgery if you haven't scanned them in before.              Please read over the following fact sheets you were given: How to Manage Your Diabetes Before and After Surgery  Why is it important to control my blood sugar before and after surgery? . Improving blood sugar levels before and after surgery helps healing and can limit problems. . A way of improving blood sugar control is eating a healthy diet by: o  Eating less sugar and carbohydrates o  Increasing activity/exercise o  Talking with your doctor about reaching your blood sugar goals . High blood sugars (greater than 180 mg/dL) can raise your risk of infections and slow your recovery, so you will need to focus on controlling your diabetes during the weeks before surgery. . Make sure that the doctor who takes care of your diabetes knows about your planned surgery including the date and location.  How do I manage my blood sugar before surgery? . Check your blood sugar at least 4 times a day, starting 2 days before surgery, to make sure that the level is not too high or low. o Check your blood sugar the morning of your surgery when you wake up and every 2 hours until you get to the Short Stay unit. . If your blood sugar  is less than 70 mg/dL, you will need to treat for low blood sugar: o Do not take insulin. o Treat a low blood sugar (less than 70 mg/dL) with  cup of clear juice (cranberry  or apple), 4 glucose tablets, OR glucose gel. o Recheck blood sugar in 15 minutes after treatment (to make sure it is greater than 70 mg/dL). If your blood sugar is not greater than 70 mg/dL on recheck, call [phone] for further instructions. . Report your blood sugar to the short stay nurse when you get to Short Stay.  . If you are admitted to the hospital after surgery: o Your blood sugar will be checked by the staff and you will probably be given insulin after surgery (instead of oral diabetes medicines) to make sure you have good blood sugar levels. o The goal for blood sugar control after surgery is 80-180 mg/dL.   WHAT DO I DO ABOUT MY DIABETES MEDICATION?  Marland Kitchen Do not take oral diabetes medicines (pills) the morning of surgery.  . THE DAY BEFORE SURGERY, take only your morning dose of Glipizide Donot take your Invokana. Take your usual dose of Victoza   units of       insulin.      . The day of surgery, do not take other diabetes injectables, including Byetta (exenatide), Bydureon (exenatide ER), Victoza (liraglutide), or Trulicity (dulaglutide).    [place] - Preparing for Surgery Before surgery, you can play an important role.  Because skin is not sterile, your skin needs to be as free of germs as possible.  You can reduce the number of germs on your skin by washing with CHG (chlorahexidine gluconate) soap before surgery.  CHG is an antiseptic cleaner which kills germs and bonds with the skin to continue killing germs even after washing. Please DO NOT use if you have an allergy to CHG or antibacterial soaps.  If your skin becomes reddened/irritated stop using the CHG and inform your nurse when you arrive at Short Stay. Do not shave (including legs and underarms) for at least 48 hours prior to the first CHG shower.  You may shave your face/neck.  Please follow these instructions carefully:  1.  Shower with CHG Soap the night before surgery and the  morning of surgery.  2.  If  you choose to wash your hair, wash your hair first as usual with your normal  shampoo.  3.  After you shampoo, rinse your hair and body thoroughly to remove the shampoo.                             4.  Use CHG as you would any other liquid soap.  You can apply chg directly to the skin and wash.  Gently with a scrungie or clean washcloth.  5.  Apply the CHG Soap to your body ONLY FROM THE NECK DOWN.   Do   not use on face/ open                           Wound or open sores. Avoid contact with eyes, ears mouth and   genitals (private parts).                       Wash face,  Genitals (private parts) with your normal soap.             6.  Wash thoroughly, paying special attention to the area where your    surgery  will be performed.  7.  Thoroughly rinse your body with warm water from the neck down.  8.  DO NOT shower/wash with your normal soap after using and rinsing off the CHG Soap.                9.  Pat yourself dry with a clean towel.            10.  Wear clean pajamas.            11.  Place clean sheets on your bed the night of your first shower and do not  sleep with pets. Day of Surgery : Do not apply any lotions/deodorants the morning of surgery.  Please wear clean clothes to the hospital/surgery center.  FAILURE TO FOLLOW THESE INSTRUCTIONS MAY RESULT IN THE CANCELLATION OF YOUR SURGERY  PATIENT SIGNATURE_________________________________  NURSE SIGNATURE__________________________________  ________________________________________________________________________   Adam Phenix  An incentive spirometer is a tool that can help keep your lungs clear and active. This tool measures how well you are filling your lungs with each breath. Taking long deep breaths may help reverse or decrease the chance of developing breathing (pulmonary) problems (especially infection) following:  A long period of time when you are unable to move or be active. BEFORE THE PROCEDURE   If the  spirometer includes an indicator to show your best effort, your nurse or respiratory therapist will set it to a desired goal.  If possible, sit up straight or lean slightly forward. Try not to slouch.  Hold the incentive spirometer in an upright position. INSTRUCTIONS FOR USE  1. Sit on the edge of your bed if possible, or sit up as far as you can in bed or on a chair. 2. Hold the incentive spirometer in an upright position. 3. Breathe out normally. 4. Place the mouthpiece in your mouth and seal your lips tightly around it. 5. Breathe in slowly and as deeply as possible, raising the piston or the ball toward the top of the column. 6. Hold your breath for 3-5 seconds or for as long as possible. Allow the piston or ball to fall to the bottom of the column. 7. Remove the mouthpiece from your mouth and breathe out normally. 8. Rest for a few seconds and repeat Steps 1 through 7 at least 10 times every 1-2 hours when you are awake. Take your time and take a few normal breaths between deep breaths. 9. The spirometer may include an indicator to show your best effort. Use the indicator as a goal to work toward during each repetition. 10. After each set of 10 deep breaths, practice coughing to be sure your lungs are clear. If you have an incision (the cut made at the time of surgery), support your incision when coughing by placing a pillow or rolled up towels firmly against it. Once you are able to get out of bed, walk around indoors and cough well. You may stop using the incentive spirometer when instructed by your caregiver.  RISKS AND COMPLICATIONS  Take your time so you do not get dizzy or light-headed.  If you are in pain, you may need to take or ask for pain medication before doing incentive spirometry. It is harder to take a deep breath if you are having pain. AFTER USE  Rest and breathe slowly and easily.  It can be helpful to keep track of a log of  your progress. Your caregiver can provide  you with a simple table to help with this. If you are using the spirometer at home, follow these instructions: [place] IF:   You are having difficultly using the spirometer.  You have trouble using the spirometer as often as instructed.  Your pain medication is not giving enough relief while using the spirometer.  You develop fever of 100.5 F (38.1 C) or higher. SEEK IMMEDIATE MEDICAL CARE IF:   You cough up bloody sputum that had not been present before.  You develop fever of 102 F (38.9 C) or greater.  You develop worsening pain at or near the incision site. MAKE SURE YOU:   Understand these instructions.  Will watch your condition.  Will get help right away if you are not doing well or get worse. Document Released: 09/13/2006 Document Revised: 07/26/2011 Document Reviewed: 11/14/2006 Exit[employer] Patient Information 2014 Exit[employer], [place].   ________________________________________________________________________  WHAT IS A BLOOD TRANSFUSION? Blood Transfusion Information  A transfusion is the replacement of blood or some of its parts. Blood is made up of multiple cells which provide different functions.  Red blood cells carry oxygen and are used for blood loss replacement.  White blood cells fight against infection.  Platelets control bleeding.  Plasma helps clot blood.  Other blood products are available for specialized needs, such as hemophilia or other clotting disorders. BEFORE THE TRANSFUSION  Who gives blood for transfusions?   Healthy volunteers who are fully evaluated to make sure their blood is safe. This is blood bank blood. Transfusion therapy is the safest it has ever been in the practice of medicine. Before blood is taken from a donor, a complete history is taken to make sure that person has no history of diseases nor engages in risky social behavior (examples are intravenous drug use or sexual activity with multiple partners). The donor's  travel history is screened to minimize risk of transmitting infections, such as malaria. The donated blood is tested for signs of infectious diseases, such as HIV and hepatitis. The blood is then tested to be sure it is compatible with you in order to minimize the chance of a transfusion reaction. If you or a relative donates blood, this is often done in anticipation of surgery and is not appropriate for emergency situations. It takes many days to process the donated blood. RISKS AND COMPLICATIONS Although transfusion therapy is very safe and saves many lives, the main dangers of transfusion include:   Getting an infectious disease.  Developing a transfusion reaction. This is an allergic reaction to something in the blood you were given. Every precaution is taken to prevent this. The decision to have a blood transfusion has been considered carefully by your caregiver before blood is given. Blood is not given unless the benefits outweigh the risks. AFTER THE TRANSFUSION  Right after receiving a blood transfusion, you will usually feel much better and more energetic. This is especially true if your red blood cells have gotten low (anemic). The transfusion raises the level of the red blood cells which carry oxygen, and this usually causes an energy increase.  The nurse administering the transfusion will monitor you carefully for complications. HOME CARE INSTRUCTIONS  No special instructions are needed after a transfusion. You may find your energy is better. Speak with your caregiver about any limitations on activity for underlying diseases you may have. SEEK MEDICAL CARE IF:   Your condition is not improving after your transfusion.  You develop redness  or irritation at the intravenous (IV) site. SEEK IMMEDIATE MEDICAL CARE IF:  Any of the following symptoms occur over the next 12 hours:  Shaking chills.  You have a temperature by mouth above 102 F (38.9 C), not controlled by  medicine.  Chest, back, or muscle pain.  People around you feel you are not acting correctly or are confused.  Shortness of breath or difficulty breathing.  Dizziness and fainting.  You get a rash or develop hives.  You have a decrease in urine output.  Your urine turns a dark color or changes to pink, red, or brown. Any of the following symptoms occur over the next 10 days:  You have a temperature by mouth above 102 F (38.9 C), not controlled by medicine.  Shortness of breath.  Weakness after normal activity.  The white part of the eye turns yellow (jaundice).  You have a decrease in the amount of urine or are urinating less often.  Your urine turns a dark color or changes to pink, red, or brown. Document Released: 04/30/2000 Document Revised: 07/26/2011 Document Reviewed: 12/18/2007 [facility] Patient Information 2014 [place], [place].  _______________________________________________________________________

## 2018-05-05 NOTE — Patient Instructions (Signed)
Preparing for your Surgery  Plan for surgery on May 11, 2018 with Dr. Precious Haws at [place] will be scheduled for a left inguinal lymph node dissection.   Pre-operative Testing -You will receive a phone call from presurgical testing at [facility] to arrange for a pre-operative testing appointment before your surgery.  This appointment normally occurs one to two weeks before your scheduled surgery.   -Bring your insurance card, copy of an advanced directive if applicable, medication list  -At that visit, you will be asked to sign a consent for a possible blood transfusion in case a transfusion becomes necessary during surgery.  The need for a blood transfusion is rare but having consent is a necessary part of your care.     -You should not be taking blood thinners or aspirin at least ten days prior to surgery unless instructed by your surgeon.  Day Before Surgery at [place] will be advised about your diet at your pre-op appointment.  Your role in recovery Your role is to become active as soon as directed by your doctor, while still giving yourself time to heal.  Rest when you feel tired. You will be asked to do the following in order to speed your recovery:  - Cough and breathe deeply. This helps toclear and expand your lungs and can prevent pneumonia. You may be given a spirometer to practice deep breathing. A staff member will show you how to use the spirometer. - Do mild physical activity. Walking or moving your legs help your circulation and body functions return to normal. A staff member will help you when you try to walk and will provide you with simple exercises. Do not try to get up or walk alone the first time. - Actively manage your pain. Managing your pain lets you move in comfort. We will ask you to rate your pain on a scale of zero to 10. It is your responsibility to tell your doctor or nurse where and how much you hurt so your pain  can be treated.  Special Considerations -If you are diabetic, you may be placed on insulin after surgery to have closer control over your blood sugars to promote healing and recovery.  This does not mean that you will be discharged on insulin.  If applicable, your oral antidiabetics will be resumed when you are tolerating a solid diet.  -Your final pathology results from surgery should be available around one week after surgery and the results will be relayed to you when available.  -Dr. Lahoma Crocker is the Surgeon that assists your GYN Oncologist with surgery.  The next day after your surgery you will either see your GYN Oncologist, Dr. Everitt Amber, or Dr. Lahoma Crocker.  -FMLA forms can be faxed to [phone] and please allow 5-7 business days for completion.   Blood Transfusion Information WHAT IS A BLOOD TRANSFUSION? A transfusion is the replacement of blood or some of its parts. Blood is made up of multiple cells which provide different functions.  Red blood cells carry oxygen and are used for blood loss replacement.  White blood cells fight against infection.  Platelets control bleeding.  Plasma helps clot blood.  Other blood products are available for specialized needs, such as hemophilia or other clotting disorders. BEFORE THE TRANSFUSION  Who gives blood for transfusions?   You may be able to donate blood to be used at a later date on yourself (autologous donation).  Relatives can be asked to  donate blood. This is generally not any safer than if you have received blood from a stranger. The same precautions are taken to ensure safety when a relative's blood is donated.  Healthy volunteers who are fully evaluated to make sure their blood is safe. This is blood bank blood. Transfusion therapy is the safest it has ever been in the practice of medicine. Before blood is taken from a donor, a complete history is taken to make sure that person has no history of diseases  nor engages in risky social behavior (examples are intravenous drug use or sexual activity with multiple partners). The donor's travel history is screened to minimize risk of transmitting infections, such as malaria. The donated blood is tested for signs of infectious diseases, such as HIV and hepatitis. The blood is then tested to be sure it is compatible with you in order to minimize the chance of a transfusion reaction. If you or a relative donates blood, this is often done in anticipation of surgery and is not appropriate for emergency situations. It takes many days to process the donated blood. RISKS AND COMPLICATIONS Although transfusion therapy is very safe and saves many lives, the main dangers of transfusion include:   Getting an infectious disease.  Developing a transfusion reaction. This is an allergic reaction to something in the blood you were given. Every precaution is taken to prevent this. The decision to have a blood transfusion has been considered carefully by your caregiver before blood is given. Blood is not given unless the benefits outweigh the risks.

## 2018-05-08 ENCOUNTER — Ambulatory Visit (HOSPITAL_COMMUNITY)
Admission: RE | Admit: 2018-05-08 | Discharge: 2018-05-08 | Disposition: A | Discharge status: Home / Self Care | Payer: BLUE CROSS/BLUE SHIELD | Source: Ambulatory Visit | Attending: Gynecologic Oncology | Admitting: Gynecologic Oncology

## 2018-05-08 ENCOUNTER — Encounter (HOSPITAL_COMMUNITY): Payer: Self-pay

## 2018-05-08 ENCOUNTER — Encounter (HOSPITAL_COMMUNITY)
Admission: RE | Admit: 2018-05-08 | Discharge: 2018-05-08 | Disposition: A | Discharge status: Home / Self Care | Payer: BLUE CROSS/BLUE SHIELD | Source: Ambulatory Visit | Attending: Obstetrics | Admitting: Obstetrics

## 2018-05-08 ENCOUNTER — Other Ambulatory Visit: Payer: Self-pay

## 2018-05-08 DIAGNOSIS — C519 Malignant neoplasm of vulva, unspecified: Secondary | ICD-10-CM

## 2018-05-08 DIAGNOSIS — Z87891 Personal history of nicotine dependence: Secondary | ICD-10-CM | POA: Diagnosis not present

## 2018-05-08 HISTORY — DX: Carcinoma in situ of vulva: D07.1

## 2018-05-08 HISTORY — DX: Cellulitis, unspecified: L03.90

## 2018-05-08 HISTORY — DX: Dyspnea, unspecified: R06.00

## 2018-05-08 LAB — ABO/RH: ABO/RH(D): O POS

## 2018-05-08 LAB — COMPREHENSIVE METABOLIC PANEL
ALBUMIN: 4.2 g/dL (ref 3.5–5.0)
ALT: 17 U/L (ref 0–44)
ANION GAP: 11 (ref 5–15)
AST: 17 U/L (ref 15–41)
Alkaline Phosphatase: 65 U/L (ref 38–126)
BUN: 29 mg/dL — ABNORMAL HIGH (ref 6–20)
CO2: 26 mmol/L (ref 22–32)
Calcium: 9.6 mg/dL (ref 8.9–10.3)
Chloride: 99 mmol/L (ref 98–111)
Creatinine, Ser: 0.86 mg/dL (ref 0.44–1.00)
GFR calc Af Amer: >60 mL/min (ref >60)
GFR calc non Af Amer: >60 mL/min (ref >60)
Glucose, Bld: 112 mg/dL — ABNORMAL HIGH (ref 70–99)
Potassium: 4.1 mmol/L (ref 3.5–5.1)
Sodium: 136 mmol/L (ref 135–145)
Total Bilirubin: 0.9 mg/dL (ref 0.3–1.2)
Total Protein: 7.6 g/dL (ref 6.5–8.1)

## 2018-05-08 LAB — HEMOGLOBIN A1C
Hgb A1c MFr Bld: 5.9 % — ABNORMAL HIGH (ref 4.8–5.6)
Mean Plasma Glucose: 122.63 mg/dL

## 2018-05-08 LAB — URINALYSIS, ROUTINE W REFLEX MICROSCOPIC
Bilirubin Urine: NEGATIVE
Glucose, UA: >=500 mg/dL — AB
Hgb urine dipstick: NEGATIVE
Ketones, ur: NEGATIVE mg/dL
Leukocytes, UA: NEGATIVE
Nitrite: NEGATIVE
Protein, ur: NEGATIVE mg/dL
SPECIFIC GRAVITY, URINE: 1.013 (ref 1.005–1.030)
pH: 5 (ref 5.0–8.0)

## 2018-05-08 LAB — CBC
HCT: 46.4 % — ABNORMAL HIGH (ref 36.0–46.0)
Hemoglobin: 15 g/dL (ref 12.0–15.0)
MCH: 29.9 pg (ref 26.0–34.0)
MCHC: 32.3 g/dL (ref 30.0–36.0)
MCV: 92.6 fL (ref 80.0–100.0)
Platelets: 299 10*3/uL (ref 150–400)
RBC: 5.01 MIL/uL (ref 3.87–5.11)
RDW: 12.6 % (ref 11.5–15.5)
WBC: 6.6 10*3/uL (ref 4.0–10.5)
nRBC: 0 % (ref 0.0–0.2)

## 2018-05-08 LAB — GLUCOSE, CAPILLARY: GLUCOSE-CAPILLARY: 111 mg/dL — AB (ref 70–99)

## 2018-05-08 NOTE — Progress Notes (Signed)
05-08-18 CMP and UA routed to Dr. Gerarda Fraction for review

## 2018-05-11 ENCOUNTER — Encounter (HOSPITAL_COMMUNITY)
Admission: RE | Disposition: A | Discharge status: Home / Self Care | Payer: Self-pay | Source: Ambulatory Visit | Attending: Obstetrics

## 2018-05-11 ENCOUNTER — Encounter (HOSPITAL_COMMUNITY): Payer: Self-pay | Admitting: *Deleted

## 2018-05-11 ENCOUNTER — Ambulatory Visit (HOSPITAL_COMMUNITY): Payer: BLUE CROSS/BLUE SHIELD | Admitting: Anesthesiology

## 2018-05-11 ENCOUNTER — Ambulatory Visit (HOSPITAL_COMMUNITY)
Admission: RE | Admit: 2018-05-11 | Discharge: 2018-05-11 | Disposition: A | Discharge status: Home / Self Care | Payer: BLUE CROSS/BLUE SHIELD | Source: Ambulatory Visit | Attending: Obstetrics | Admitting: Obstetrics

## 2018-05-11 DIAGNOSIS — Z8249 Family history of ischemic heart disease and other diseases of the circulatory system: Secondary | ICD-10-CM | POA: Insufficient documentation

## 2018-05-11 DIAGNOSIS — C519 Malignant neoplasm of vulva, unspecified: Secondary | ICD-10-CM | POA: Diagnosis present

## 2018-05-11 DIAGNOSIS — I10 Essential (primary) hypertension: Secondary | ICD-10-CM | POA: Diagnosis not present

## 2018-05-11 DIAGNOSIS — Z6841 Body Mass Index (BMI) 40.0 and over, adult: Secondary | ICD-10-CM | POA: Insufficient documentation

## 2018-05-11 DIAGNOSIS — Z87891 Personal history of nicotine dependence: Secondary | ICD-10-CM | POA: Insufficient documentation

## 2018-05-11 DIAGNOSIS — Z833 Family history of diabetes mellitus: Secondary | ICD-10-CM | POA: Insufficient documentation

## 2018-05-11 DIAGNOSIS — Z79899 Other long term (current) drug therapy: Secondary | ICD-10-CM | POA: Diagnosis not present

## 2018-05-11 DIAGNOSIS — R590 Localized enlarged lymph nodes: Secondary | ICD-10-CM

## 2018-05-11 DIAGNOSIS — E119 Type 2 diabetes mellitus without complications: Secondary | ICD-10-CM | POA: Diagnosis not present

## 2018-05-11 DIAGNOSIS — Z7984 Long term (current) use of oral hypoglycemic drugs: Secondary | ICD-10-CM | POA: Diagnosis not present

## 2018-05-11 HISTORY — PX: LYMPH NODE DISSECTION: SHX5087

## 2018-05-11 LAB — TYPE AND SCREEN
ABO/RH(D): O POS
Antibody Screen: NEGATIVE

## 2018-05-11 LAB — GLUCOSE, CAPILLARY
GLUCOSE-CAPILLARY: 122 mg/dL — AB (ref 70–99)
Glucose-Capillary: 94 mg/dL (ref 70–99)

## 2018-05-11 SURGERY — LYMPH NODE DISSECTION
Anesthesia: General | Site: Groin | Laterality: Left

## 2018-05-11 MED ORDER — STERILE WATER FOR IRRIGATION IR SOLN
Status: DC | PRN
  Administered 2018-05-11: 1000 mL

## 2018-05-11 MED ORDER — FENTANYL CITRATE (PF) 100 MCG/2ML IJ SOLN
INTRAMUSCULAR | Status: AC
  Filled 2018-05-11: qty 2

## 2018-05-11 MED ORDER — BUPIVACAINE-EPINEPHRINE (PF) 0.25% -1:200000 IJ SOLN
INTRAMUSCULAR | Status: AC
  Filled 2018-05-11: qty 30

## 2018-05-11 MED ORDER — EPHEDRINE SULFATE-NACL 50-0.9 MG/10ML-% IV SOSY
PREFILLED_SYRINGE | INTRAVENOUS | Status: DC | PRN
  Administered 2018-05-11: 10 mg via INTRAVENOUS

## 2018-05-11 MED ORDER — ONDANSETRON HCL 4 MG/2ML IJ SOLN
INTRAMUSCULAR | Status: AC
  Filled 2018-05-11: qty 2

## 2018-05-11 MED ORDER — LACTATED RINGERS IV SOLN
INTRAVENOUS | Status: DC
  Administered 2018-05-11: 12:00:00 via INTRAVENOUS

## 2018-05-11 MED ORDER — HYDROCODONE-ACETAMINOPHEN 5-325 MG PO TABS: 1.0000 | ORAL_TABLET | Freq: Four times a day (QID) | ORAL | 0 refills | Status: DC | PRN

## 2018-05-11 MED ORDER — OXYCODONE HCL 5 MG/5ML PO SOLN
5.0000 mg | Freq: Once | ORAL | Status: DC | PRN
  Filled 2018-05-11: qty 5

## 2018-05-11 MED ORDER — LIDOCAINE 2% (20 MG/ML) 5 ML SYRINGE
INTRAMUSCULAR | Status: AC
  Filled 2018-05-11: qty 5

## 2018-05-11 MED ORDER — SCOPOLAMINE 1 MG/3DAYS TD PT72
MEDICATED_PATCH | TRANSDERMAL | Status: AC
  Filled 2018-05-11: qty 1

## 2018-05-11 MED ORDER — LIDOCAINE 2% (20 MG/ML) 5 ML SYRINGE
INTRAMUSCULAR | Status: DC | PRN
  Administered 2018-05-11: 100 mg via INTRAVENOUS

## 2018-05-11 MED ORDER — FENTANYL CITRATE (PF) 100 MCG/2ML IJ SOLN
INTRAMUSCULAR | Status: DC | PRN
  Administered 2018-05-11 (×2): 50 ug via INTRAVENOUS
  Administered 2018-05-11 (×4): 25 ug via INTRAVENOUS
  Administered 2018-05-11 (×2): 50 ug via INTRAVENOUS

## 2018-05-11 MED ORDER — SCOPOLAMINE 1 MG/3DAYS TD PT72
1.0000 | MEDICATED_PATCH | TRANSDERMAL | Status: DC
  Administered 2018-05-11: 1.5 mg via TRANSDERMAL

## 2018-05-11 MED ORDER — PROMETHAZINE HCL 25 MG/ML IJ SOLN
INTRAMUSCULAR | Status: AC
  Filled 2018-05-11: qty 1

## 2018-05-11 MED ORDER — PROPOFOL 10 MG/ML IV BOLUS
INTRAVENOUS | Status: DC | PRN
  Administered 2018-05-11: 20 mg via INTRAVENOUS
  Administered 2018-05-11: 180 mg via INTRAVENOUS

## 2018-05-11 MED ORDER — BUPIVACAINE-EPINEPHRINE (PF) 0.25% -1:200000 IJ SOLN
INTRAMUSCULAR | Status: DC | PRN
  Administered 2018-05-11: 30 mL

## 2018-05-11 MED ORDER — OXYCODONE HCL 5 MG PO TABS: 5.0000 mg | ORAL_TABLET | Freq: Once | ORAL | Status: DC | PRN

## 2018-05-11 MED ORDER — PROPOFOL 10 MG/ML IV BOLUS
INTRAVENOUS | Status: AC
  Filled 2018-05-11: qty 20

## 2018-05-11 MED ORDER — IBUPROFEN 200 MG PO TABS: 200.0000 mg | ORAL_TABLET | Freq: Four times a day (QID) | ORAL | Status: DC | PRN

## 2018-05-11 MED ORDER — KETOROLAC TROMETHAMINE 30 MG/ML IJ SOLN
INTRAMUSCULAR | Status: AC
  Filled 2018-05-11: qty 1

## 2018-05-11 MED ORDER — ONDANSETRON HCL 4 MG/2ML IJ SOLN
INTRAMUSCULAR | Status: DC | PRN
  Administered 2018-05-11: 4 mg via INTRAVENOUS

## 2018-05-11 MED ORDER — PHENYLEPHRINE 40 MCG/ML (10ML) SYRINGE FOR IV PUSH (FOR BLOOD PRESSURE SUPPORT)
PREFILLED_SYRINGE | INTRAVENOUS | Status: AC
  Filled 2018-05-11: qty 10

## 2018-05-11 MED ORDER — CEFAZOLIN SODIUM-DEXTROSE 2-4 GM/100ML-% IV SOLN
2.0000 g | INTRAVENOUS | Status: AC
  Administered 2018-05-11: 2 g via INTRAVENOUS
  Filled 2018-05-11: qty 100

## 2018-05-11 MED ORDER — EPHEDRINE 5 MG/ML INJ
INTRAVENOUS | Status: AC
  Filled 2018-05-11: qty 10

## 2018-05-11 MED ORDER — FENTANYL CITRATE (PF) 100 MCG/2ML IJ SOLN: 25.0000 ug | INTRAMUSCULAR | Status: DC | PRN

## 2018-05-11 MED ORDER — BUPIVACAINE HCL (PF) 0.5 % IJ SOLN
INTRAMUSCULAR | Status: AC
  Filled 2018-05-11: qty 30

## 2018-05-11 MED ORDER — KETOROLAC TROMETHAMINE 30 MG/ML IJ SOLN
30.0000 mg | Freq: Once | INTRAMUSCULAR | Status: AC | PRN
  Administered 2018-05-11: 30 mg via INTRAVENOUS

## 2018-05-11 MED ORDER — MIDAZOLAM HCL 2 MG/2ML IJ SOLN
INTRAMUSCULAR | Status: AC
  Filled 2018-05-11: qty 2

## 2018-05-11 MED ORDER — ONDANSETRON HCL 4 MG/2ML IJ SOLN
4.0000 mg | Freq: Once | INTRAMUSCULAR | Status: AC | PRN
  Administered 2018-05-11: 4 mg via INTRAVENOUS

## 2018-05-11 MED ORDER — DEXAMETHASONE SODIUM PHOSPHATE 10 MG/ML IJ SOLN
INTRAMUSCULAR | Status: DC | PRN
  Administered 2018-05-11: 8 mg via INTRAVENOUS

## 2018-05-11 MED ORDER — MEPERIDINE HCL 50 MG/ML IJ SOLN: 6.2500 mg | INTRAMUSCULAR | Status: DC | PRN

## 2018-05-11 MED ORDER — HYDROCODONE-ACETAMINOPHEN 5-325 MG PO TABS
ORAL_TABLET | ORAL | Status: AC
  Administered 2018-05-11: 1
  Filled 2018-05-11: qty 1

## 2018-05-11 MED ORDER — PROMETHAZINE HCL 25 MG/ML IJ SOLN
6.2500 mg | INTRAMUSCULAR | Status: DC | PRN
  Administered 2018-05-11: 6.25 mg via INTRAVENOUS

## 2018-05-11 MED ORDER — LACTATED RINGERS IV SOLN
INTRAVENOUS | Status: DC | PRN
  Administered 2018-05-11 (×2): via INTRAVENOUS

## 2018-05-11 MED ORDER — PHENYLEPHRINE 40 MCG/ML (10ML) SYRINGE FOR IV PUSH (FOR BLOOD PRESSURE SUPPORT)
PREFILLED_SYRINGE | INTRAVENOUS | Status: DC | PRN
  Administered 2018-05-11 (×3): 80 ug via INTRAVENOUS

## 2018-05-11 MED ORDER — 0.9 % SODIUM CHLORIDE (POUR BTL) OPTIME
TOPICAL | Status: DC | PRN
  Administered 2018-05-11: 1000 mL

## 2018-05-11 MED ORDER — ROCURONIUM BROMIDE 10 MG/ML (PF) SYRINGE
PREFILLED_SYRINGE | INTRAVENOUS | Status: AC
  Filled 2018-05-11: qty 10

## 2018-05-11 MED ORDER — IBUPROFEN 100 MG/5ML PO SUSP
200.0000 mg | Freq: Four times a day (QID) | ORAL | Status: DC | PRN
  Filled 2018-05-11: qty 20

## 2018-05-11 SURGICAL SUPPLY — 52 items
CHLORAPREP W/TINT 26ML (MISCELLANEOUS) ×2 IMPLANT
CLIP VESOCCLUDE LG 6/CT (CLIP) IMPLANT
CLIP VESOCCLUDE MED 6/CT (CLIP) ×4 IMPLANT
CLIP VESOCCLUDE MED LG 6/CT (CLIP) ×2 IMPLANT
COVER SURGICAL LIGHT HANDLE (MISCELLANEOUS) ×2 IMPLANT
COVER WAND RF STERILE (DRAPES) IMPLANT
DERMABOND ADVANCED (GAUZE/BANDAGES/DRESSINGS) ×1
DERMABOND ADVANCED .7 DNX12 (GAUZE/BANDAGES/DRESSINGS) ×1 IMPLANT
DRAIN CHANNEL RND F F (WOUND CARE) ×2 IMPLANT
DRAPE SHEET LG 3/4 BI-LAMINATE (DRAPES) ×4 IMPLANT
DRAPE UNDERBUTTOCKS STRL (DRAPE) ×2 IMPLANT
DRAPE UTILITY XL STRL (DRAPES) ×2 IMPLANT
DRSG OPSITE POSTOP 4X6 (GAUZE/BANDAGES/DRESSINGS) ×2 IMPLANT
DRSG TEGADERM 4X4.75 (GAUZE/BANDAGES/DRESSINGS) ×2 IMPLANT
ELECT COATED BLADE 2.86 ST (ELECTRODE) ×2 IMPLANT
ELECT REM PT RETURN 15FT ADLT (MISCELLANEOUS) ×2 IMPLANT
EVACUATOR SILICONE 100CC (DRAIN) ×6 IMPLANT
GAUZE SPONGE 2X2 8PLY STRL LF (GAUZE/BANDAGES/DRESSINGS) ×1 IMPLANT
GAUZE SPONGE 4X4 12PLY STRL (GAUZE/BANDAGES/DRESSINGS) ×2 IMPLANT
GAUZE SPONGE 4X4 16PLY XRAY LF (GAUZE/BANDAGES/DRESSINGS) ×2 IMPLANT
GLOVE BIOGEL PI IND STRL 7.0 (GLOVE) ×2 IMPLANT
GLOVE BIOGEL PI INDICATOR 7.0 (GLOVE) ×2
GLOVE SURG SS PI 6.5 STRL IVOR (GLOVE) ×2 IMPLANT
GLOVE SURG SS PI 7.0 STRL IVOR (GLOVE) ×2 IMPLANT
GOWN STRL REUS W/ TWL XL LVL3 (GOWN DISPOSABLE) ×1 IMPLANT
GOWN STRL REUS W/TWL LRG LVL3 (GOWN DISPOSABLE) ×4 IMPLANT
GOWN STRL REUS W/TWL XL LVL3 (GOWN DISPOSABLE) ×1
KIT BASIN OR (CUSTOM PROCEDURE TRAY) ×2 IMPLANT
LEGGING LITHOTOMY PAIR STRL (DRAPES) ×4 IMPLANT
MARKER SKIN DUAL TIP RULER LAB (MISCELLANEOUS) ×2 IMPLANT
NEEDLE HYPO 22GX1.5 SAFETY (NEEDLE) ×2 IMPLANT
PACK GENERAL/GYN (CUSTOM PROCEDURE TRAY) ×2 IMPLANT
PACK UNIVERSAL I (CUSTOM PROCEDURE TRAY) ×2 IMPLANT
SHEARS HARMONIC 9CM CVD (BLADE) IMPLANT
SPONGE GAUZE 2X2 STER 10/PKG (GAUZE/BANDAGES/DRESSINGS) ×1
SURGIFLO W/THROMBIN 8M KIT (HEMOSTASIS) ×2 IMPLANT
SUT MNCRL AB 4-0 PS2 18 (SUTURE) ×4 IMPLANT
SUT NYLON 3 0 (SUTURE) ×4 IMPLANT
SUT PROLENE 0 CT 1 30 (SUTURE) ×2 IMPLANT
SUT SILK 2 0 (SUTURE) ×1
SUT SILK 2-0 18XBRD TIE 12 (SUTURE) ×1 IMPLANT
SUT SILK 3 0 (SUTURE) ×1
SUT SILK 3-0 18XBRD TIE 12 (SUTURE) ×1 IMPLANT
SUT VIC AB 2-0 CT1 27 (SUTURE) ×1
SUT VIC AB 2-0 CT1 TAPERPNT 27 (SUTURE) ×1 IMPLANT
SUT VIC AB 4-0 PS2 18 (SUTURE) ×4 IMPLANT
SUT VICRYL RAPIDE 4/0 PS 2 (SUTURE) ×2 IMPLANT
SYR BULB IRRIGATION 50ML (SYRINGE) ×2 IMPLANT
SYR CONTROL 10ML LL (SYRINGE) ×2 IMPLANT
TAPE CLOTH SURG 4X10 WHT LF (GAUZE/BANDAGES/DRESSINGS) ×2 IMPLANT
TOWEL OR 17X26 10 PK STRL BLUE (TOWEL DISPOSABLE) ×2 IMPLANT
TOWEL OR NON WOVEN STRL DISP B (DISPOSABLE) ×2 IMPLANT

## 2018-05-11 NOTE — Anesthesia Preprocedure Evaluation (Signed)
Anesthesia Evaluation  Patient identified by MRN, date of birth, ID band Patient awake    Reviewed: Allergy & Precautions, NPO status , Patient's Chart, lab work & pertinent test results  Airway Mallampati: II  TM Distance: >3 FB Neck ROM: Full    Dental  (+) Teeth Intact, Dental Advisory Given   Pulmonary former smoker,    Pulmonary exam normal breath sounds clear to auscultation       Cardiovascular hypertension, Pt. on medications Normal cardiovascular exam Rhythm:Regular Rate:Normal     Neuro/Psych negative neurological ROS  negative psych ROS   GI/Hepatic negative GI ROS, Neg liver ROS,   Endo/Other  diabetes, Type 2, Oral Hypoglycemic AgentsMorbid obesity  Renal/GU negative Renal ROS   Vulvar lesion     Musculoskeletal negative musculoskeletal ROS (+)   Abdominal (+) + obese,   Peds  Hematology negative hematology ROS (+)   Anesthesia Other Findings Day of surgery medications reviewed with the patient.  Reproductive/Obstetrics                             Anesthesia Physical  Anesthesia Plan  ASA: III  Anesthesia Plan: General   Post-op Pain Management:    Induction: Intravenous  PONV Risk Score and Plan: 4 or greater and Scopolamine patch - Pre-op, Midazolam, Dexamethasone and Ondansetron  Airway Management Planned: LMA  Additional Equipment:   Intra-op Plan:   Post-operative Plan: Extubation in OR  Informed Consent: I have reviewed the patients History and Physical, chart, labs and discussed the procedure including the risks, benefits and alternatives for the proposed anesthesia with the patient or authorized representative who has indicated his/her understanding and acceptance.   Dental advisory given  Plan Discussed with: CRNA  Anesthesia Plan Comments:         Anesthesia Quick Evaluation

## 2018-05-11 NOTE — Transfer of Care (Signed)
Immediate Anesthesia Transfer of Care Note  Patient: Laurie Hoffman  Procedure(s) Performed: LEFT INGUINAL LYMPH NODE  DISSECTION (Left Groin)  Patient Location: PACU  Anesthesia Type:General  Level of Consciousness: awake, alert  and oriented  Airway & Oxygen Therapy: Patient Spontanous Breathing and Patient connected to face mask oxygen  Post-op Assessment: Report given to RN and Post -op Vital signs reviewed and stable  Post vital signs: Reviewed and stable  Last Vitals:  Vitals Value Taken Time  BP 136/83 05/11/2018  4:09 PM  Temp    Pulse 91 05/11/2018  4:11 PM  Resp 15 05/11/2018  4:11 PM  SpO2 95 % 05/11/2018  4:11 PM  Vitals shown include unvalidated device data.  Last Pain:  Vitals:   05/11/18 1159  TempSrc: Oral  PainSc: 0-No pain      Patients Stated Pain Goal: 4 (59/74/[id number])  Complications: No apparent anesthesia complications

## 2018-05-11 NOTE — Discharge Instructions (Addendum)
Inguinal Lymphadenectomy, Care After This sheet gives you information about how to care for yourself after your procedure. Your health care provider may also give you more specific instructions. If you have problems or questions, contact your health care provider. What can I expect after the procedure? After the procedure, it is common to have:  Pain.  Swelling and bruising around the incision area.  Some minimal fluid or blood draining from your incisions. Follow these instructions at home: Incision care  Follow instructions from your health care provider about how to take care of your incisions. Make sure you: ? Wash your hands with soap and water before you change your bandage (dressing). If soap and water are not available, use hand sanitizer. ? Change your dressing as told by your health care provider. ? Remove the paper tape dressing after 24 hours. ? Remove the honeycomb dressing 72 hours after surgery ? Leave stitches (sutures), skin glue, or small adhesive strips in place. These skin closures may need to stay in place for 2 weeks or longer. If adhesive strip edges start to loosen and curl up, you may trim the loose edges. Do not remove adhesive strips completely unless your health care provider tells you to do that.  Check your incision area every day for signs of infection. Check for: ? More redness, swelling, or pain. ? More fluid or blood. ? Warmth. ? Pus or a bad smell.  Wear loose, soft clothing while your incisions heal. Driving  Do not drive or use heavy machinery while taking prescription pain medicine.  Do not drive for 24 hours if you were given a medicine to help you relax (sedative) during your procedure. Activity  Do not lift anything that is heavier than 10 lb (4.5 kg), or the limit that you are told, until your health care provider says that it is safe.  Ask your health care provider what activities are safe for you.A lot of activity during the first week  after surgery can increase pain and swelling. For 1 week after your procedure: ? Avoid activities that take a lot of effort, such as exercise or sports. ? You may walk and climb stairs as needed for daily activity, but avoid long walks or climbing stairs for exercise. Managing pain and swelling    Put ice on painful or swollen areas for the first 24 hours. ? Put ice in a plastic bag. ? Place a towel between your skin and the bag. ? Leave the ice on for 20 minutes, 2-3 times a day. General instructions  Do not take baths, swim, or use a hot tub until your health care provider approves. Ask your health care provider if you may take showers. You may only be allowed to take sponge baths.  Take over-the-counter and prescription medicines only as told by your health care provider.  To prevent or treat constipation while you are taking prescription pain medicine, your health care provider may recommend that you: ? Drink enough fluid to keep your urine pale yellow. ? Take over-the-counter or prescription medicines. ? Eat foods that are high in fiber, such as fresh fruits and vegetables, whole grains, and beans. ? Limit foods that are high in fat and processed sugars, such as fried and sweet foods.  Do not use any products that contain nicotine or tobacco, such as cigarettes and e-cigarettes. If you need help quitting, ask your health care provider.  Drink enough fluid to keep your urine pale yellow.  Keep all follow-up visits  as told by your health care provider. This is important. Contact a health care provider if:  You have more redness, swelling, or pain around your incisions or your groin area.  You have more fluid or blood coming from your incisions.  Your incisions feel warm to the touch.  You have severe pain and medicines do not help.  You have abdominal pain or swelling.  You cannot eat or drink without vomiting.  You cannot urinate or pass a bowel movement.  You  faint.  You feel dizzy.  You have nausea and vomiting.  You have a fever. Get help right away if:  You have pus or a bad smell coming from your incisions. You have chest pain.  General Anesthesia, Adult, Care After This sheet gives you information about how to care for yourself after your procedure. Your health care provider may also give you more specific instructions. If you have problems or questions, contact your health care provider. What can I expect after the procedure? After the procedure, the following side effects are common: Pain or discomfort at the IV site. Nausea. Vomiting. Sore throat. Trouble concentrating. Feeling cold or chills. Weak or tired. Sleepiness and fatigue. Soreness and body aches. These side effects can affect parts of the body that were not involved in surgery. Follow these instructions at home:  For at least 24 hours after the procedure: Have a responsible adult stay with you. It is important to have someone help care for you until you are awake and alert. Rest as needed. Do not: Participate in activities in which you could fall or become injured. Drive. Use heavy machinery. Drink alcohol. Take sleeping pills or medicines that cause drowsiness. Make important decisions or sign legal documents. Take care of children on your own. Eating and drinking Follow any instructions from your health care provider about eating or drinking restrictions. When you feel hungry, start by eating small amounts of foods that are soft and easy to digest (bland), such as toast. Gradually return to your regular diet. Drink enough fluid to keep your urine pale yellow. If you vomit, rehydrate by drinking water, juice, or clear broth. General instructions If you have sleep apnea, surgery and certain medicines can increase your risk for breathing problems. Follow instructions from your health care provider about wearing your sleep device: Anytime you are sleeping,  including during daytime naps. While taking prescription pain medicines, sleeping medicines, or medicines that make you drowsy. Return to your normal activities as told by your health care provider. Ask your health care provider what activities are safe for you. Take over-the-counter and prescription medicines only as told by your health care provider. If you smoke, do not smoke without supervision. Keep all follow-up visits as told by your health care provider. This is important. Contact a health care provider if: You have nausea or vomiting that does not get better with medicine. You cannot eat or drink without vomiting. You have pain that does not get better with medicine. You are unable to pass urine. You develop a skin rash. You have a fever. You have redness around your IV site that gets worse. Get help right away if: You have difficulty breathing. You have chest pain. You have blood in your urine or stool, or you vomit blood. Summary After the procedure, it is common to have a sore throat or nausea. It is also common to feel tired. Have a responsible adult stay with you for the first 24 hours after general anesthesia.  It is important to have someone help care for you until you are awake and alert. When you feel hungry, start by eating small amounts of foods that are soft and easy to digest (bland), such as toast. Gradually return to your regular diet. Drink enough fluid to keep your urine pale yellow. Return to your normal activities as told by your health care provider. Ask your health care provider what activities are safe for you. This information is not intended to replace advice given to you by your health care provider. Make sure you discuss any questions you have with your health care provider. Document Released: 08/09/2000 Document Revised: 12/17/2016 Document Reviewed: 12/17/2016 Elsevier Interactive Patient Education  2019 [place] have problems  breathing. Summary  Pain, swelling, and bruising are common after the procedure.  Check your incision area every day for signs of infection, such as more redness, swelling, or pain.  Put ice on painful or swollen areas for 20 minutes, 2-3 times a day. This information is not intended to replace advice given to you by your health care provider. Make sure you discuss any questions you have with your health care provider. Document Released: 08/12/2016 Document Revised: 08/12/2016 Document Reviewed: 08/12/2016 Elsevier Interactive Patient Education  2019 [employer].

## 2018-05-11 NOTE — Anesthesia Postprocedure Evaluation (Addendum)
Anesthesia Post Note  Patient: Laurie Hoffman  Procedure(s) Performed: LEFT INGUINAL LYMPH NODE  DISSECTION (Left Groin)     Patient location during evaluation: PACU Anesthesia Type: General Level of consciousness: awake Pain management: pain level controlled Vital Signs Assessment: post-procedure vital signs reviewed and stable Respiratory status: spontaneous breathing Cardiovascular status: stable Anesthetic complications: yes Anesthetic complication details: PONV   Last Vitals:  Vitals:   05/11/18 1719 05/11/18 1800  BP: 117/76 133/72  Pulse: 85 94  Resp: 15 16  Temp: 36.7 C 36.6 C  SpO2: 97% 94%    Last Pain:  Vitals:   05/11/18 1800  TempSrc:   PainSc: 0-No pain   Pain Goal: Patients Stated Pain Goal: 4 (05/11/18 1159)               Huston Foley

## 2018-05-11 NOTE — Addendum Note (Signed)
Addendum  created 05/11/18 1945 by Lyn Hollingshead, MD   Clinical Note Signed

## 2018-05-11 NOTE — Op Note (Signed)
Date: 05/11/18  Preoperative Diagnosis:  Vulvar cancer  Postoperative Diagnosis:  Vulvar cancer.   Procedure(s) Performed:  Left inguinal lymphadenectomy  Surgeon: Mart Piggs, MD  Assistant Surgeon: Lahoma Crocker, M.D. (an MD assistant was necessary for tissue manipulation, management of instrumentation, retraction and positioning due to the complexity of the case and hospital policies).  Anesthesia: GETA  Specimens: Left inguinal lymph nodes  Estimated Blood Loss: 20 mL.    Complications: None.   Operative Findings: Enlarged superficial vessels, normal lymph nodes on palpation. Healing left sided vulvar wound  Procedure in Detail:    The patient was taken the operating room.  General endotracheal anesthesia was performed without complications.  The patient was placed in dorsal lithotomy position.  She was prepped and draped in sterile fashion.  A timeout was performed.     A Foley catheter was placed to gravity by me.   At this point the left inguino-femoral lymphadenectomy was performed. The bony landmarks of the pubic tubercle and the ASIS were identified and the palpable location of the femoral artery. A 6cm incision was made 1 fingerbreadth below the inguinal ligament on the right anterior thigh/groin parallel to the inguinal ligament. The camper's fascia was scored and the inferior and superior skin flaps were created with elevation of skin hooks and use of the bovie for dissection. The inguino-femoral lymph nodes were circumscribed in the operative bed. Lymph node tissue was grasped and using bovie and harmonic dissection, the entire inguinofemoral bed was resected from its attachments to the level of the cribiform facia overlying the femoral vessels. The boundaries of this dissection was the sartorius muscle laterally, the adductor longus tendon medially, the inguinal ligament superiorally, the cribiform fascia posteriorally. The great saphenous vein was  identified and skeletonized during this dissection and spared. A 19 french JP drain was placed in the bed of the groin dissection and brought out lateral to the incision. It was secured with nylon. The camper's fascia was closed with running 2-0 vicryl overlying the drain. The skin was closed with interrupted 4-0 vicryl in the dermis and running 4-0 monocryl in a subcuticular fashion. Dermabond was applied to the incision. A bulb was placed to suction on the JP drain.  The patient tolerated the procedure well. Sponge, lap, and needle counts were correct x 2.

## 2018-05-11 NOTE — Interval H&P Note (Signed)
History and Physical Interval Note:  05/11/2018 1:48 PM  Laurie Hoffman  has presented today for surgery, with the diagnosis of VULVAR CANCER  The various methods of treatment have been discussed with the patient and family. After consideration of risks, benefits and other options for treatment, the patient has consented to  Procedure(s): LEFT INGUINAL LYMPH NODE  DISSECTION (Left) as a surgical intervention .  The patient's history has been reviewed, patient examined, no change in status, stable for surgery.  I have reviewed the patient's chart and labs.  Questions were answered to the patient's satisfaction.     [name]

## 2018-05-11 NOTE — Anesthesia Procedure Notes (Signed)
Procedure Name: LMA Insertion Date/Time: 05/11/2018 2:32 PM Performed by: Malika Demario D, CRNA Pre-anesthesia Checklist: Patient identified, Emergency Drugs available, Suction available and Patient being monitored Patient Re-evaluated:Patient Re-evaluated prior to induction Oxygen Delivery Method: Circle system utilized Preoxygenation: Pre-oxygenation with 100% oxygen Induction Type: IV induction Ventilation: Mask ventilation without difficulty LMA Size: 4.0 Tube type: Oral Number of attempts: 1 Placement Confirmation: positive ETCO2 and breath sounds checked- equal and bilateral Tube secured with: Tape Dental Injury: Teeth and Oropharynx as per pre-operative assessment

## 2018-05-12 ENCOUNTER — Encounter (HOSPITAL_COMMUNITY): Payer: Self-pay | Admitting: Obstetrics

## 2018-05-12 ENCOUNTER — Telehealth: Payer: Self-pay

## 2018-05-12 MED ORDER — ZOLPIDEM TARTRATE 5 MG PO TABS: 5.0000 mg | ORAL_TABLET | Freq: Every evening | ORAL | 1 refills | Status: DC | PRN

## 2018-05-12 NOTE — Telephone Encounter (Signed)
Pt returned my call.  She reports she is doing well post-op- eating/drinking well, voiding and having bms.  Reports she has used ice pack twice today, reports drain with 75 cc reddish fluid today.  Reminded her when she empties drain, then squeeze bulb and close the opening back to create the suction, can empty 2-3 times a day depending on how much it is draining to avoid heaviness and to keep a check on amounts, can write time and amt on sheet of paper. Reports incision area is doing well also and has not had to use pain med- occas Tylenol or Ibuprofen.  Instructed on alternating the two, ibuprofen is good for inflammation as well.  Pt voiced understanding.  Confirmed upcoming appt on 05-23-2018.  Encouraged to call with questions or concerns before then.  No other needs per pt at this time.

## 2018-05-12 NOTE — Telephone Encounter (Signed)
Hi Tonetta, so sorry for the delay in getting back to you.  I have been out of the office a bit for the holidays.  I am so sorry about your surgery.  I know this has to be incredibly tough.  I hope you are recovering well.  We can definitely send over something for sleep if you would like.  We could try a low-dose of Ambien.  Start with half a tab and see if that works if not you can go up to a whole tab.  Sometimes getting a good nights rest can make a big difference in how you feel the next day and helping with your stress etc.  Let me know if it is helpful or not.

## 2018-05-12 NOTE — Telephone Encounter (Signed)
Outgoing call to patient per Laurie John NP regarding "how is she doing post-op, how much from drain?"  No answer, left VM for her to call our office.

## 2018-05-15 ENCOUNTER — Telehealth: Payer: Self-pay

## 2018-05-15 NOTE — Telephone Encounter (Addendum)
Incoming call from Laurie Hoffman.  She reported earlier she ate 2 boiled eggs at 8:30 am for bkfast then about 12:45 pm prior to lunch, she took her blood sugar and it was 101.  Will notify Joylene John NP.  Encouraged Laurie Hoffman to continue to monitor blood sugars per her PCP orders -as it is important for wound healing and continue monitor and writing down outputs of JP drain.  Laurie Hoffman voiced understanding.

## 2018-05-15 NOTE — Telephone Encounter (Addendum)
Outgoing call to pt per Laurie John NP in regards to JP drain amounts as :  Friday: total of 100 ml , Saturday: total of 75 ml for the day, and Sunday: total of 70 ml for the day. Today it was 25 ml so far.  She reports she doesn't normally check her blood sugar unless not feeling well and reports she has been feeling fine.  Encouraged her to check her blood sugar today before lunch and I will check back with her .

## 2018-05-18 ENCOUNTER — Telehealth: Payer: Self-pay

## 2018-05-18 NOTE — Telephone Encounter (Signed)
Incoming call from patient - she reports jp drainage as getting less each day, was 25 ml on Monday, [place]on was half of 25 ml total for entire day.  Notified Joylene John NP, instructed pt to continue to monitor, record, and we will see her as scheduled on this coming Tuesday.  Encouraged to call back for any concerns before then. Pt voiced understanding. No other needs per pt at this time.

## 2018-05-23 ENCOUNTER — Inpatient Hospital Stay: Payer: BLUE CROSS/BLUE SHIELD | Attending: Gynecology | Admitting: Obstetrics

## 2018-05-23 ENCOUNTER — Encounter: Payer: Self-pay | Admitting: Obstetrics

## 2018-05-23 VITALS — BP 139/89 | HR 74 | Temp 97.7°F | Resp 16 | Ht 63.75 in | Wt 265.0 lb

## 2018-05-23 DIAGNOSIS — D071 Carcinoma in situ of vulva: Secondary | ICD-10-CM | POA: Insufficient documentation

## 2018-05-23 DIAGNOSIS — Z9889 Other specified postprocedural states: Secondary | ICD-10-CM | POA: Insufficient documentation

## 2018-05-23 NOTE — Patient Instructions (Signed)
Return in 2-3 weeks for wound check

## 2018-05-23 NOTE — Progress Notes (Signed)
S: returns for early postop check/drain check. States drain popped today when she was up to RR. Has been draining 20cc/day +/-  O:  Vitals:   05/23/18 1026  BP: 139/89  Pulse: 74  Resp: 16  Temp: 97.7 F (36.5 C)  SpO2: 99%   Inguinal wound CDI. Drain site mildly erythematous and drain stitch loose. I removed the drain today. Normal exudate seen, no purulence.  A/P We reviewed her pathology. The reactive node concept was discussed.  Given these findings recommendation will be for surveillance. RTC 2-3 weeks for wound check and then dispo for surveillance

## 2018-05-29 ENCOUNTER — Encounter: Payer: Self-pay | Admitting: Family Medicine

## 2018-05-29 DIAGNOSIS — E119 Type 2 diabetes mellitus without complications: Secondary | ICD-10-CM

## 2018-05-29 MED ORDER — LIRAGLUTIDE 18 MG/3ML ~~LOC~~ SOPN: PEN_INJECTOR | SUBCUTANEOUS | 0 refills | Status: DC

## 2018-05-29 MED ORDER — CANAGLIFLOZIN-METFORMIN HCL 150-1000 MG PO TABS: 1.0000 | ORAL_TABLET | Freq: Two times a day (BID) | ORAL | 0 refills | Status: DC

## 2018-05-30 ENCOUNTER — Ambulatory Visit: Payer: BLUE CROSS/BLUE SHIELD | Admitting: Gynecology

## 2018-06-06 ENCOUNTER — Telehealth: Payer: Self-pay | Admitting: *Deleted

## 2018-06-06 NOTE — Telephone Encounter (Signed)
Called and spoke with the patient and moved her appt from late afternoon to earlier in the day

## 2018-06-07 ENCOUNTER — Inpatient Hospital Stay: Payer: BLUE CROSS/BLUE SHIELD | Admitting: Obstetrics

## 2018-06-07 ENCOUNTER — Encounter: Payer: Self-pay | Admitting: Obstetrics

## 2018-06-07 VITALS — BP 140/88 | HR 84 | Temp 98.0°F | Resp 20 | Ht 63.75 in | Wt 250.0 lb

## 2018-06-07 DIAGNOSIS — Z9889 Other specified postprocedural states: Secondary | ICD-10-CM

## 2018-06-07 DIAGNOSIS — D071 Carcinoma in situ of vulva: Secondary | ICD-10-CM

## 2018-06-07 NOTE — Patient Instructions (Signed)
Return in mid-March with Dr.Lynesha Bango for a vulvar and groin exam

## 2018-06-07 NOTE — Progress Notes (Signed)
S: here for wound check prior to surveillance dispo No complaints  O:  Vitals:   06/07/18 1155  BP: 140/88  Pulse: 84  Resp: 20  Temp: 98 F (36.7 C)  SpO2: 100%   Inguinal incision is CDI. Drain site is erythematous but no fluctuation or drainage. May be c/w candida Vulvar incision indurated but healed  A/P Early vulvar CA Return Q3 months x 2years for surveillance.

## 2018-06-22 ENCOUNTER — Ambulatory Visit (INDEPENDENT_AMBULATORY_CARE_PROVIDER_SITE_OTHER): Payer: BLUE CROSS/BLUE SHIELD | Admitting: Family Medicine

## 2018-06-22 ENCOUNTER — Encounter: Payer: Self-pay | Admitting: Family Medicine

## 2018-06-22 VITALS — BP 126/65 | HR 77 | Ht 64.0 in | Wt 253.0 lb

## 2018-06-22 DIAGNOSIS — E119 Type 2 diabetes mellitus without complications: Secondary | ICD-10-CM

## 2018-06-22 DIAGNOSIS — I10 Essential (primary) hypertension: Secondary | ICD-10-CM | POA: Diagnosis not present

## 2018-06-22 DIAGNOSIS — E118 Type 2 diabetes mellitus with unspecified complications: Secondary | ICD-10-CM

## 2018-06-22 DIAGNOSIS — B372 Candidiasis of skin and nail: Secondary | ICD-10-CM

## 2018-06-22 MED ORDER — LIRAGLUTIDE 18 MG/3ML ~~LOC~~ SOPN: PEN_INJECTOR | SUBCUTANEOUS | 6 refills | Status: DC

## 2018-06-22 MED ORDER — NYSTATIN 100000 UNIT/GM EX POWD: Freq: Four times a day (QID) | CUTANEOUS | 2 refills | Status: DC

## 2018-06-22 MED ORDER — CANAGLIFLOZIN-METFORMIN HCL 150-1000 MG PO TABS: 1.0000 | ORAL_TABLET | Freq: Two times a day (BID) | ORAL | 1 refills | Status: DC

## 2018-06-22 NOTE — Progress Notes (Signed)
Subjective:    CC: DM  HPI: Diabetes - no hypoglycemic events. No wounds or sores that are not healing well. No increased thirst or urination. Checking glucose at home. Taking medications as prescribed without any side effects.  Her last hemoglobin A1c on December 23 was 5.9 which looks fantastic with significant improvement from previous of 7.0 back in September.  Hypertension- Pt denies chest pain, SOB, dizziness, or heart palpitations.  Taking meds as directed w/o problems.  Denies medication side effects.    Recently had surgery for vulvar cancer on December 26. She has really done well.    She would like to have a refill on her nystatin powder.     Past medical history, Surgical history, Family history not pertinant except as noted below, Social history, Allergies, and medications have been entered into the medical record, reviewed, and corrections made.   Review of Systems: No fevers, chills, night sweats, weight loss, chest pain, or shortness of breath.   Objective:    General: Well Developed, well nourished, and in no acute distress.  Neuro: Alert and oriented x3, extra-ocular muscles intact, sensation grossly intact.  HEENT: Normocephalic, atraumatic  Skin: Warm and dry, no rashes. Cardiac: Regular rate and rhythm, no murmurs rubs or gallops, no lower extremity edema.  Respiratory: Clear to auscultation bilaterally. Not using accessory muscles, speaking in full sentences.   Impression and Recommendations:    DM - Foot exam performed.  Well controlled. Continue current regimen. Follow up in  4 months.    HTN - Well controlled. Continue current regimen. Follow up in  4 months.    Skin yeast - will refill her nystatin powder.

## 2018-07-05 ENCOUNTER — Telehealth: Payer: Self-pay | Admitting: *Deleted

## 2018-07-05 ENCOUNTER — Encounter: Payer: Self-pay | Admitting: Family Medicine

## 2018-07-05 MED ORDER — TELMISARTAN-HCTZ 40-12.5 MG PO TABS: 1.0000 | ORAL_TABLET | Freq: Every day | ORAL | 1 refills | Status: DC

## 2018-07-05 NOTE — Telephone Encounter (Signed)
Called and spoke with the patient regarding her appt with Dr. Gerarda Fraction. Explained that Dr. Gerarda Fraction is no longer with the practice and move hr appt to see Dr. Fermin Schwab.

## 2018-07-16 ENCOUNTER — Encounter: Payer: Self-pay | Admitting: Family Medicine

## 2018-07-17 MED ORDER — SIMVASTATIN 20 MG PO TABS: ORAL_TABLET | ORAL | 0 refills | Status: DC

## 2018-07-18 ENCOUNTER — Other Ambulatory Visit: Payer: Self-pay | Admitting: Family Medicine

## 2018-07-18 DIAGNOSIS — E119 Type 2 diabetes mellitus without complications: Secondary | ICD-10-CM

## 2018-07-25 ENCOUNTER — Inpatient Hospital Stay: Payer: BLUE CROSS/BLUE SHIELD | Attending: Gynecology | Admitting: Gynecology

## 2018-07-25 ENCOUNTER — Encounter: Payer: Self-pay | Admitting: Gynecology

## 2018-07-25 VITALS — BP 140/94 | HR 90 | Temp 97.7°F | Resp 18 | Ht 64.0 in | Wt 255.5 lb

## 2018-07-25 DIAGNOSIS — D071 Carcinoma in situ of vulva: Secondary | ICD-10-CM | POA: Insufficient documentation

## 2018-07-25 NOTE — Patient Instructions (Signed)
Please follow up with Dr. Cindee Lame in June as scheduled. Call the office if you have any questions or concerns prior to visit.

## 2018-07-25 NOTE — Progress Notes (Signed)
Consult Note: Gyn-Onc   Laurie Hoffman 53 y.o. female  Chief Complaint  Patient presents with  . 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  Patient had a good postoperative course and is well-healed.  No evidence of disease.  Plan: Patient return to see [place] in 3 months for another checkup.  Warning signs of vulvar carcinoma symptoms were reviewed.  Interval history: Patient returns today for postoperative checkup and follow-up of stage Ib vulvar carcinoma.  Since her last visit she is done well.  She reports that all incisions are well-healed.  She has no new vulvar symptoms.  She is making every attempt to try to keep her diabetes with fasting blood sugars of less than 130.  HPI: 53 year old white married female gravida 0 seen in consultation request of Dr.Metheney regarding management of a vulvar lesion.  Apparently lesion is been present for a number of years.  The patient reports that it is on occasion been larger and then smaller.  She has some pain associated with the lesion.  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  Patient denies any past gynecologic history although she went through menopause at age 54.  This is the same age as her mother went through menopause.  She claims Pap smears have been normal in the past.  She does have a recent Pap smear that was normal.  Patient underwent wide local excision of the left vulvar lesion April 25, 2018.  Final pathology showed a 2.2 cm lesion with 3 mm of invasion.  Subsequently on May 11, 2018 the patient underwent left inguinal lymphadenectomy.  1 lymph node was retrieved showing no evidence of metastatic disease.  Despite the patient's poorly controlled diabetes she had good wound healing.  Review of Systems:10 point review of systems is negative except as noted in interval history.   Vitals: Blood pressure (!) 140/94, pulse 90, temperature 97.7 F  (36.5 C), temperature source Oral, resp. rate 18, height 5' 4"  (1.626 m), weight 255 lb 8 oz (115.9 kg), SpO2 99 %.  Physical Exam: General : The patient is a healthy woman in no acute distress.  HEENT: normocephalic, extraoccular movements normal; neck is supple without thyromegally  Lynphnodes: Supraclavicular and inguinal nodes not enlarged, the left inguinal incision is well-healed. Abdomen: Obese soft, non-tender, no ascites, no organomegally, no masses, no hernias.  There is a fair amount of erythema below her pannus. Pelvic:  EGBUS: Normal female, the left vulvar incision is well-healed.  No new lesions are noted. Vagina: Normal, no lesions  Urethra and Bladder: Normal, non-tender  Cervix: Normal Uterus: Difficult to evaluate given the patient's habitus.    Bi-manual examination: Non-tender; no adenxal masses or nodularity    Lower extremities: No edema or varicosities. Normal range of motion      Allergies  Allergen Reactions  . Atorvastatin Other (See Comments)    Leg weakness  . Latex Rash    Past Medical History:  Diagnosis Date  . Cellulitis 04/29/2018   Labia majora  . Dry eye syndrome   . Dyspnea   . Eczema   . Hypertension   . Macular degeneration   . Obesity   . Seasonal allergies   . Type 2 diabetes mellitus ([place])    followed by pcp  . VIN III (vulvar intraepithelial neoplasia III)   . Vulvar cancer ([place])    Stage IB  . Wears glasses  Past Surgical History:  Procedure Laterality Date  . LYMPH NODE DISSECTION Left 05/11/2018   Procedure: LEFT INGUINAL LYMPH NODE  DISSECTION;  Surgeon: [name], MD;  Location: [facility];  Service: Gynecology;  Laterality: Left;  . NO PAST SURGERIES    . VULVECTOMY N/A 04/25/2018   Procedure: WIDE EXCISION VULVECTOMY;  Surgeon: Marti Sleigh, MD;  Location: [facility];  Service: Gynecology;  Laterality: N/A;    Current Outpatient Medications  Medication Sig Dispense Refill  .  BAYER MICROLET LANCETS lancets Use to check blood sugar twice daily. Dx: E11.8 100 each 12  . Blood Glucose Monitoring Suppl (BAYER CONTOUR MONITOR) w/Device KIT 1 Device by Does not apply route as directed. 1 kit 0  . Canagliflozin-metFORMIN HCl (INVOKAMET) [phone] MG TABS Take 1 tablet by mouth 2 (two) times daily. 180 tablet 1  . fluticasone (FLONASE) 50 MCG/ACT nasal spray Place 1 spray into both nostrils daily as needed for allergies or rhinitis.    Marland Kitchen glipiZIDE (GLUCOTROL XL) 2.5 MG 24 hr tablet TAKE 1 TABLET(2.5 MG) BY MOUTH DAILY WITH BREAKFAST (Patient taking differently: Take 2.5 mg by mouth daily with breakfast. ) 90 tablet 3  . glucose blood (BAYER CONTOUR TEST) test strip Use as instructed 100 each 12  . liraglutide (VICTOZA) 18 MG/3ML SOPN ADMINISTER 2.4 MG UNDER THE SKIN DAILY. DUE FOR FOLLOW UP 9 pen 0  . montelukast (SINGULAIR) 10 MG tablet Take 1 tablet (10 mg total) by mouth at bedtime. 90 tablet 3  . Multiple Vitamins-Minerals (WOMENS MULTIVITAMIN) TABS Take 1 tablet by mouth daily.    Marland Kitchen NOVOTWIST 32G X 5 MM MISC USE AS DIRECTED 100 each 11  . nystatin (MYCOSTATIN/NYSTOP) powder Apply topically 4 (four) times daily. 60 g 2  . simvastatin (ZOCOR) 20 MG tablet TAKE 1 TABLET(20 MG) BY MOUTH DAILY AT 6 PM 90 tablet 0  . telmisartan-hydrochlorothiazide (MICARDIS HCT) 40-12.5 MG tablet Take 1 tablet by mouth daily. 90 tablet 1  . XIIDRA 5 % SOLN Place 1 drop into both eyes 2 (two) times daily.   11  . zolpidem (AMBIEN) 5 MG tablet Take 1 tablet (5 mg total) by mouth at bedtime as needed for sleep. 20 tablet 1  . albuterol (PROVENTIL HFA;VENTOLIN HFA) 108 (90 Base) MCG/ACT inhaler Inhale 1-2 puffs into the lungs every 6 (six) hours as needed for wheezing or shortness of breath. (Patient not taking: Reported on 07/25/2018) 1 Inhaler 0   No current facility-administered medications for this visit.     Social History   Socioeconomic History  . Marital status: Married    Spouse name:  Not on file  . Number of children: Not on file  . Years of education: Not on file  . Highest education level: Not on file  Occupational History  . Not on file  Social Needs  . Financial resource strain: Not on file  . Food insecurity:    Worry: Not on file    Inability: Not on file  . Transportation needs:    Medical: Not on file    Non-medical: Not on file  Tobacco Use  . Smoking status: Former Smoker    Years: 2.00    Types: Cigarettes    Last attempt to quit: 05/17/1986    Years since quitting: 32.2  . Smokeless tobacco: Never Used  . Tobacco comment: 1 pack per week  Substance and Sexual Activity  . Alcohol use: Yes    Comment: very rare  . Drug  use: No  . Sexual activity: Not Currently    Birth control/protection: None  Lifestyle  . Physical activity:    Days per week: Not on file    Minutes per session: Not on file  . Stress: Not on file  Relationships  . Social connections:    Talks on phone: Not on file    Gets together: Not on file    Attends religious service: Not on file    Active member of club or organization: Not on file    Attends meetings of clubs or organizations: Not on file    Relationship status: Not on file  . Intimate partner violence:    Fear of current or ex partner: Not on file    Emotionally abused: Not on file    Physically abused: Not on file    Forced sexual activity: Not on file  Other Topics Concern  . Not on file  Social History Narrative  . Not on file    Family History  Problem Relation Age of Onset  . Diabetes Mother        Insulin  . Heart attack Mother   . Cancer Father 70       melanoma  . Diabetes Sister        Insulin  . Hypertension Sister       Marti Sleigh, MD 07/25/2018, 11:57 AM

## 2018-07-31 ENCOUNTER — Ambulatory Visit: Payer: BLUE CROSS/BLUE SHIELD | Admitting: Obstetrics

## 2018-07-31 ENCOUNTER — Other Ambulatory Visit: Payer: Self-pay | Admitting: Family Medicine

## 2018-07-31 DIAGNOSIS — J069 Acute upper respiratory infection, unspecified: Secondary | ICD-10-CM

## 2018-08-02 MED ORDER — ALBUTEROL SULFATE HFA 108 (90 BASE) MCG/ACT IN AERS: 1.0000 | INHALATION_SPRAY | Freq: Four times a day (QID) | RESPIRATORY_TRACT | 0 refills | Status: DC | PRN

## 2018-08-13 ENCOUNTER — Encounter: Payer: Self-pay | Admitting: Family Medicine

## 2018-08-13 ENCOUNTER — Other Ambulatory Visit: Payer: Self-pay | Admitting: Family Medicine

## 2018-08-13 DIAGNOSIS — E119 Type 2 diabetes mellitus without complications: Secondary | ICD-10-CM

## 2018-08-14 MED ORDER — LIRAGLUTIDE 18 MG/3ML ~~LOC~~ SOPN: PEN_INJECTOR | SUBCUTANEOUS | 3 refills | Status: DC

## 2018-08-15 ENCOUNTER — Encounter: Payer: Self-pay | Admitting: Family Medicine

## 2018-08-15 ENCOUNTER — Other Ambulatory Visit: Payer: Self-pay

## 2018-08-15 ENCOUNTER — Telehealth (INDEPENDENT_AMBULATORY_CARE_PROVIDER_SITE_OTHER): Payer: BLUE CROSS/BLUE SHIELD | Admitting: Family Medicine

## 2018-08-15 VITALS — Temp 97.4°F | Wt 247.0 lb

## 2018-08-15 DIAGNOSIS — J45901 Unspecified asthma with (acute) exacerbation: Secondary | ICD-10-CM

## 2018-08-15 DIAGNOSIS — R059 Cough, unspecified: Secondary | ICD-10-CM

## 2018-08-15 DIAGNOSIS — J4541 Moderate persistent asthma with (acute) exacerbation: Secondary | ICD-10-CM

## 2018-08-15 DIAGNOSIS — R05 Cough: Secondary | ICD-10-CM | POA: Diagnosis not present

## 2018-08-15 MED ORDER — PREDNISONE 20 MG PO TABS: 40.0000 mg | ORAL_TABLET | Freq: Every day | ORAL | 0 refills | Status: DC

## 2018-08-15 MED ORDER — INSULIN PEN NEEDLE 32G X 5 MM MISC: 11 refills | Status: DC

## 2018-08-15 NOTE — Progress Notes (Signed)
Virtual Visit via Telephone Note  I connected with Laurie Hoffman on 08/15/18 at  1:40 PM EDT by telephone and verified that I am speaking with the correct person using two identifiers.   I discussed the limitations, risks, security and privacy concerns of performing an evaluation and management service by telephone and the availability of in person appointments. I also discussed with the patient that there may be a patient responsible charge related to this service. The patient expressed understanding and agreed to proceed.       Laurie Lecher, MD    Acute Office Visit  Subjective:    Patient ID: Laurie Hoffman, female    DOB: 02-Apr-1966, 53 y.o.   MRN: [id number]  Chief Complaint  Patient presents with  . Allergic Rhinitis     HPI Patient is in today for cough for about 3 days.  Was in the yard a lot.  Cough kep her up lst night.  Post nasal drip at night.  Using her inhaler.  Cough is mostly dry.   She c/o of cough. She feels it is seasonal and related to pollen and allergies. Sinuses feel "hot".  No fever.  Taking her Zyrtec and Flonase.  Taking her singulair at night. Using her albuterol about 3 times a day.  + wheezing. No sig SOB.  Very itchy eyes.  This is very typical of allergies.  No GI sxs.    Past Medical History:  Diagnosis Date  . Cellulitis 04/29/2018   Labia majora  . Dry eye syndrome   . Dyspnea   . Eczema   . Hypertension   . Macular degeneration   . Obesity   . Seasonal allergies   . Type 2 diabetes mellitus ([place])    followed by pcp  . VIN III (vulvar intraepithelial neoplasia III)   . Vulvar cancer ([place])    Stage IB  . Wears glasses     Past Surgical History:  Procedure Laterality Date  . LYMPH NODE DISSECTION Left 05/11/2018   Procedure: LEFT INGUINAL LYMPH NODE  DISSECTION;  Surgeon: [name], MD;  Location: [facility];  Service: Gynecology;  Laterality: Left;  . NO PAST SURGERIES    . VULVECTOMY N/A 04/25/2018   Procedure: WIDE EXCISION  VULVECTOMY;  Surgeon: Marti Sleigh, MD;  Location: [facility];  Service: Gynecology;  Laterality: N/A;    Family History  Problem Relation Age of Onset  . Diabetes Mother        Insulin  . Heart attack Mother   . Cancer Father 70       melanoma  . Diabetes Sister        Insulin  . Hypertension Sister     Social History   Socioeconomic History  . Marital status: Married    Spouse name: Not on file  . Number of children: Not on file  . Years of education: Not on file  . Highest education level: Not on file  Occupational History  . Not on file  Social Needs  . Financial resource strain: Not on file  . Food insecurity:    Worry: Not on file    Inability: Not on file  . Transportation needs:    Medical: Not on file    Non-medical: Not on file  Tobacco Use  . Smoking status: Former Smoker    Years: 2.00    Types: Cigarettes    Last attempt to quit: 05/17/1986    Years since quitting: 32.2  .  Smokeless tobacco: Never Used  . Tobacco comment: 1 pack per week  Substance and Sexual Activity  . Alcohol use: Yes    Comment: very rare  . Drug use: No  . Sexual activity: Not Currently    Birth control/protection: None  Lifestyle  . Physical activity:    Days per week: Not on file    Minutes per session: Not on file  . Stress: Not on file  Relationships  . Social connections:    Talks on phone: Not on file    Gets together: Not on file    Attends religious service: Not on file    Active member of club or organization: Not on file    Attends meetings of clubs or organizations: Not on file    Relationship status: Not on file  . Intimate partner violence:    Fear of current or ex partner: Not on file    Emotionally abused: Not on file    Physically abused: Not on file    Forced sexual activity: Not on file  Other Topics Concern  . Not on file  Social History Narrative  . Not on file    Outpatient Medications Prior to Visit  Medication Sig  Dispense Refill  . albuterol (PROVENTIL HFA;VENTOLIN HFA) 108 (90 Base) MCG/ACT inhaler Inhale 1-2 puffs into the lungs every 6 (six) hours as needed for wheezing or shortness of breath. 1 Inhaler 0  . BAYER MICROLET LANCETS lancets Use to check blood sugar twice daily. Dx: E11.8 100 each 12  . Blood Glucose Monitoring Suppl (BAYER CONTOUR MONITOR) w/Device KIT 1 Device by Does not apply route as directed. 1 kit 0  . Canagliflozin-metFORMIN HCl (INVOKAMET) [phone] MG TABS Take 1 tablet by mouth 2 (two) times daily. 180 tablet 1  . cetirizine (ZYRTEC) 10 MG tablet Take 10 mg by mouth daily.    . fluticasone (FLONASE) 50 MCG/ACT nasal spray Place 1 spray into both nostrils daily as needed for allergies or rhinitis.    Marland Kitchen glipiZIDE (GLUCOTROL XL) 2.5 MG 24 hr tablet TAKE 1 TABLET(2.5 MG) BY MOUTH DAILY WITH BREAKFAST (Patient taking differently: Take 2.5 mg by mouth daily with breakfast. ) 90 tablet 3  . glucose blood (BAYER CONTOUR TEST) test strip Use as instructed 100 each 12  . liraglutide (VICTOZA) 18 MG/3ML SOPN ADMINISTER 2.4 MG UNDER THE SKIN DAILY. DUE FOR FOLLOW UP 3 pen 0  . liraglutide (VICTOZA) 18 MG/3ML SOPN ADMINISTER 2.4 MG UNDER THE SKIN DAILY. DUE FOR FOLLOW UP 9 pen 3  . montelukast (SINGULAIR) 10 MG tablet Take 1 tablet (10 mg total) by mouth at bedtime. 90 tablet 3  . Multiple Vitamins-Minerals (WOMENS MULTIVITAMIN) TABS Take 1 tablet by mouth daily.    Marland Kitchen nystatin (MYCOSTATIN/NYSTOP) powder Apply topically 4 (four) times daily. 60 g 2  . simvastatin (ZOCOR) 20 MG tablet TAKE 1 TABLET(20 MG) BY MOUTH DAILY AT 6 PM 90 tablet 0  . telmisartan-hydrochlorothiazide (MICARDIS HCT) 40-12.5 MG tablet Take 1 tablet by mouth daily. 90 tablet 1  . XIIDRA 5 % SOLN Place 1 drop into both eyes 2 (two) times daily.   11  . zolpidem (AMBIEN) 5 MG tablet Take 1 tablet (5 mg total) by mouth at bedtime as needed for sleep. 20 tablet 1  . NOVOTWIST 32G X 5 MM MISC USE AS DIRECTED 100 each 11   No  facility-administered medications prior to visit.     Allergies  Allergen Reactions  . Atorvastatin Other (See  Comments)    Leg weakness  . Latex Rash    ROS     Objective:    Physical Exam   General: Speaking clearly in complete sentences without any shortness of breath.  Alert and oriented x3.  Normal judgment. No apparent acute distress.   Temp (!) 97.4 F (36.3 C)   Wt 247 lb (112 kg)   BMI 42.40 kg/m  Wt Readings from Last 3 Encounters:  08/15/18 247 lb (112 kg)  07/25/18 255 lb 8 oz (115.9 kg)  06/22/18 253 lb (114.8 kg)    Health Maintenance Due  Topic Date Due  . HIV Screening  08/27/1982    There are no preventive care reminders to display for this patient.   Lab Results  Component Value Date   TSH 2.800 12/23/2016   Lab Results  Component Value Date   WBC 6.6 05/08/2018   HGB 15.0 05/08/2018   HCT 46.4 (H) 05/08/2018   MCV 92.6 05/08/2018   PLT 299 05/08/2018   Lab Results  Component Value Date   NA 136 05/08/2018   K 4.1 05/08/2018   CO2 26 05/08/2018   GLUCOSE 112 (H) 05/08/2018   BUN 29 (H) 05/08/2018   CREATININE 0.86 05/08/2018   BILITOT 0.9 05/08/2018   ALKPHOS 65 05/08/2018   AST 17 05/08/2018   ALT 17 05/08/2018   PROT 7.6 05/08/2018   ALBUMIN 4.2 05/08/2018   CALCIUM 9.6 05/08/2018   ANIONGAP 11 05/08/2018   Lab Results  Component Value Date   CHOL 144 09/28/2017   Lab Results  Component Value Date   HDL 82 09/28/2017   Lab Results  Component Value Date   LDLCALC 46 09/28/2017   Lab Results  Component Value Date   TRIG 84 09/28/2017   Lab Results  Component Value Date   CHOLHDL 1.8 09/28/2017   Lab Results  Component Value Date   HGBA1C 5.9 (H) 05/08/2018       Assessment & Plan:   Problem List Items Addressed This Visit    None    Visit Diagnoses    Cough    -  Primary   Moderate persistent extrinsic asthma with acute exacerbation       Relevant Medications   predniSONE (DELTASONE) 20 MG tablet    Bronchitis, allergic, unspecified asthma severity, with acute exacerbation       Relevant Medications   predniSONE (DELTASONE) 20 MG tablet     Likely allergic response causes bronchospasm.  Call if any worsen or run a fever. Continue to use the albuterol TID prn and wean as tolrated.   Meds ordered this encounter  Medications  . predniSONE (DELTASONE) 20 MG tablet    Sig: Take 2 tablets (40 mg total) by mouth daily with breakfast.    Dispense:  10 tablet    Refill:  0  . Insulin Pen Needle (NOVOTWIST) 32G X 5 MM MISC    Sig: USE AS DIRECTED.  Dx Diabetes    Dispense:  100 each    Refill:  11     Laurie Lecher, MD      I discussed the assessment and treatment plan with the patient. The patient was provided an opportunity to ask questions and all were answered. The patient agreed with the plan and demonstrated an understanding of the instructions.   The patient was advised to call back or seek an in-person evaluation if the symptoms worsen or if the condition fails to improve as anticipated.  I provided 15 minutes of non-face-to-face time during this encounter.

## 2018-08-15 NOTE — Progress Notes (Signed)
She reports that this kept her up most of the night. He sxs have been cough that has been more in her chest, sinus pressure (swimmy head), she could feel the drainage. She goes thru this every spring. Denies any fevers, she had gotten a little hoarse with this also. She has also been working thru this. Maryruth Eve, [place] Crocker, CMA

## 2018-09-28 ENCOUNTER — Telehealth: Payer: Self-pay | Admitting: *Deleted

## 2018-09-28 NOTE — Telephone Encounter (Signed)
Patient called back and moved her appt from 6/23 to 6/2

## 2018-09-28 NOTE — Telephone Encounter (Signed)
Called and left the patient a message to call the office back, need too move her 6/23 appt

## 2018-09-29 ENCOUNTER — Other Ambulatory Visit: Payer: Self-pay | Admitting: Family Medicine

## 2018-10-16 NOTE — Progress Notes (Signed)
Consult Note: Gyn-Onc  Laurie Hoffman 53 y.o. female  CC:  Chief Complaint  Patient presents with  . Vulvar cancer (HCC)    HPI: 53 year old white married female gravida 0 seen in consultation request of Dr.Metheney regarding management of a vulvar lesion.  Apparently lesion is been present for a number of years.  The patient reports that it is on occasion been larger and then smaller.  She has some pain associated with the lesion.  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  Patient denies any past gynecologic history although she went through menopause at age 26.  This is the same age as her mother went through menopause.  She claims Pap smears have been normal in the past.  She does have a recent Pap smear that was normal.  Patient underwent wide local excision of the left vulvar lesion April 25, 2018.  Final pathology showed a 2.2 cm lesion with 3 mm of invasion.  Subsequently on May 11, 2018 the patient underwent left inguinal lymphadenectomy.  One lymph node was retrieved showing no evidence of metastatic disease.  Interval History:  She last saw Dr. Fermin Schwab for a follow-up visit March 2020.  She comes in today for an interval evaluation.  She comes in today for follow-up.  Is overall doing very well with exception of a rash.  She does use nystatin powder but she notices that in the summer months the rash gets worse and worse.  She gets it when she is outside mowing her lawn.  She spent most of the day outside yesterday mowing her lawn.  She otherwise is doing well.  She has not feel any lesions on her vulva.  She is had no bleeding or itching.  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  Her husband is just celebrated his 5-year anniversary from the right leg osteosarcoma.  There are no new medical problems in her family.  Review of Systems: Constitutional: Denies fever. Skin: As above Cardiovascular: No  chest pain, shortness of breath, or edema  Pulmonary: No cough  Gastro Intestinal: No nausea, vomiting, constipation, or diarrhea reported.  Genitourinary: Denies vaginal bleeding and discharge.  Musculoskeletal: No joint pain.  Psychology: No changes  Current Meds:  Outpatient Encounter Medications as of 10/17/2018  Medication Sig  . albuterol (PROVENTIL HFA;VENTOLIN HFA) 108 (90 Base) MCG/ACT inhaler Inhale 1-2 puffs into the lungs every 6 (six) hours as needed for wheezing or shortness of breath.  Marland Kitchen BAYER MICROLET LANCETS lancets Use to check blood sugar twice daily. Dx: E11.8  . Blood Glucose Monitoring Suppl (BAYER CONTOUR MONITOR) w/Device KIT 1 Device by Does not apply route as directed.  . Canagliflozin-metFORMIN HCl (INVOKAMET) [phone] MG TABS Take 1 tablet by mouth 2 (two) times daily.  . cetirizine (ZYRTEC) 10 MG tablet Take 10 mg by mouth daily.  . fluticasone (FLONASE) 50 MCG/ACT nasal spray Place 1 spray into both nostrils daily as needed for allergies or rhinitis.  Marland Kitchen glipiZIDE (GLUCOTROL XL) 2.5 MG 24 hr tablet TAKE 1 TABLET(2.5 MG) BY MOUTH DAILY WITH BREAKFAST (Patient taking differently: Take 2.5 mg by mouth daily with breakfast. )  . glucose blood (BAYER CONTOUR TEST) test strip Use as instructed  . Insulin Pen Needle (NOVOTWIST) 32G X 5 MM MISC USE AS DIRECTED.  Dx Diabetes  . liraglutide (VICTOZA) 18 MG/3ML SOPN ADMINISTER 2.4 MG UNDER THE SKIN DAILY. DUE FOR FOLLOW UP  .  liraglutide (VICTOZA) 18 MG/3ML SOPN ADMINISTER 2.4 MG UNDER THE SKIN DAILY. DUE FOR FOLLOW UP  . montelukast (SINGULAIR) 10 MG tablet Take 1 tablet (10 mg total) by mouth at bedtime.  . Multiple Vitamins-Minerals (WOMENS MULTIVITAMIN) TABS Take 1 tablet by mouth daily.  Marland Kitchen nystatin (MYCOSTATIN/NYSTOP) powder Apply topically 4 (four) times daily.  . predniSONE (DELTASONE) 20 MG tablet Take 2 tablets (40 mg total) by mouth daily with breakfast.  . simvastatin (ZOCOR) 20 MG tablet TAKE 1 TABLET(20 MG) BY  MOUTH DAILY AT 6 PM  . telmisartan-hydrochlorothiazide (MICARDIS HCT) 40-12.5 MG tablet Take 1 tablet by mouth daily.  Marland Kitchen XIIDRA 5 % SOLN Place 1 drop into both eyes 2 (two) times daily.   Marland Kitchen zolpidem (AMBIEN) 5 MG tablet Take 1 tablet (5 mg total) by mouth at bedtime as needed for sleep.   No facility-administered encounter medications on file as of 10/17/2018.     Allergy:  Allergies  Allergen Reactions  . Atorvastatin Other (See Comments)    Leg weakness  . Latex Rash    Social Hx:   Social History   Socioeconomic History  . Marital status: Married    Spouse name: Not on file  . Number of children: Not on file  . Years of education: Not on file  . Highest education level: Not on file  Occupational History  . Not on file  Social Needs  . Financial resource strain: Not on file  . Food insecurity:    Worry: Not on file    Inability: Not on file  . Transportation needs:    Medical: Not on file    Non-medical: Not on file  Tobacco Use  . Smoking status: Former Smoker    Years: 2.00    Types: Cigarettes    Last attempt to quit: 05/17/1986    Years since quitting: 32.4  . Smokeless tobacco: Never Used  . Tobacco comment: 1 pack per week  Substance and Sexual Activity  . Alcohol use: Yes    Comment: very rare  . Drug use: No  . Sexual activity: Not Currently    Birth control/protection: None  Lifestyle  . Physical activity:    Days per week: Not on file    Minutes per session: Not on file  . Stress: Not on file  Relationships  . Social connections:    Talks on phone: Not on file    Gets together: Not on file    Attends religious service: Not on file    Active member of club or organization: Not on file    Attends meetings of clubs or organizations: Not on file    Relationship status: Not on file  . Intimate partner violence:    Fear of current or ex partner: Not on file    Emotionally abused: Not on file    Physically abused: Not on file    Forced sexual  activity: Not on file  Other Topics Concern  . Not on file  Social History Narrative  . Not on file    Past Surgical Hx:  Past Surgical History:  Procedure Laterality Date  . LYMPH NODE DISSECTION Left 05/11/2018   Procedure: LEFT INGUINAL LYMPH NODE  DISSECTION;  Surgeon: [name], MD;  Location: [facility];  Service: Gynecology;  Laterality: Left;  . NO PAST SURGERIES    . VULVECTOMY N/A 04/25/2018   Procedure: WIDE EXCISION VULVECTOMY;  Surgeon: Marti Sleigh, MD;  Location: [facility];  Service: Gynecology;  Laterality: N/A;    Past Medical Hx:  Past Medical History:  Diagnosis Date  . Cellulitis 04/29/2018   Labia majora  . Dry eye syndrome   . Dyspnea   . Eczema   . Hypertension   . Macular degeneration   . Obesity   . Seasonal allergies   . Type 2 diabetes mellitus ([place])    followed by pcp  . VIN III (vulvar intraepithelial neoplasia III)   . Vulvar cancer ([place])    Stage IB  . Wears glasses     Oncology Hx:   No history exists.    Family Hx:  Family History  Problem Relation Age of Onset  . Diabetes Mother        Insulin  . Heart attack Mother   . Cancer Father 40       melanoma  . Diabetes Sister        Insulin  . Hypertension Sister     Vitals:  Blood pressure 126/60, pulse 85, temperature 98 F (36.7 C), temperature source Oral, resp. rate 18, height _0  (1.626 m), weight 256 lb 9.6 oz (116.4 kg), SpO2 100 %.  Physical Exam: Well-nourished well-developed female in no acute distress.    Neck: Supple, no lymphadenopathy, no thyromegaly.  Abdomen: Morbidly obese.  Abdomen is soft and nontender.  There are no palpable masses or hepatosplenomegaly but exam is limited by habitus.  She does have a fairly diffuse candidal rash under the pannus extending from her abdomen to the mons and to the anterior thighs where the skin is overhanging and somewhat moist.  Pelvic: External genitalia is notable for a well-healing left  vulvar incision.  The entire vulva was inspected and there are no visible lesions.  There are no palpable abnormalities.  Groin her left groin incision is well-healed.  I cannot appreciate any lymphadenopathy though exam is limited by habitus.   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  She has no evidence of recurrent disease today which she is very pleased about.  She does however have a fairly significant candidal infection.  She has been using nystatin powder but she does admit that she has difficulty applying the powder under the pannus.  She will try to do it lying down but is not able to get the powder in all of the creases.  I sent a prescription for nystatin cream to her pharmacy as this might be easier for her to use.  I also suggested that she have some cloth under the pannus particularly if she is going to be outside working to allow the skin not to be in direct contact with the skin as that would help with the healing.  She will follow-up with her other physicians as scheduled and return to see [place] in 3 months.  Keiana Tavella A., MD 10/17/2018, 10:44 AM

## 2018-10-17 ENCOUNTER — Encounter: Payer: Self-pay | Admitting: Gynecologic Oncology

## 2018-10-17 ENCOUNTER — Inpatient Hospital Stay: Payer: BC Managed Care – PPO | Attending: Gynecology | Admitting: Gynecologic Oncology

## 2018-10-17 ENCOUNTER — Other Ambulatory Visit: Payer: Self-pay

## 2018-10-17 VITALS — BP 126/60 | HR 85 | Temp 98.0°F | Resp 18 | Ht 64.0 in | Wt 256.6 lb

## 2018-10-17 DIAGNOSIS — C519 Malignant neoplasm of vulva, unspecified: Secondary | ICD-10-CM | POA: Insufficient documentation

## 2018-10-17 MED ORDER — NYSTATIN 100000 UNIT/GM EX OINT: 1.0000 "application " | TOPICAL_OINTMENT | Freq: Two times a day (BID) | CUTANEOUS | 6 refills | Status: DC

## 2018-10-17 NOTE — Patient Instructions (Addendum)
Please return to see [place] in 3 months.  Nystatin skin cream or ointment What is this medicine? NYSTATIN (nye STAT in) is an antifungal medicine. It is used to treat certain kinds of fungal or yeast infections of the skin. This medicine may be used for other purposes; ask your health care provider or pharmacist if you have questions. COMMON BRAND NAME(S): Mycostatin, Nystex, Pediaderm AF What should I tell my health care provider before I take this medicine? They need to know if you have any of these conditions: -an unusual or allergic reaction to nystatin, other foods, dyes or preservatives -pregnant or trying to get pregnant -breast-feeding How should I use this medicine? This medicine is for external use on the skin only. Follow the directions on the prescription label. Wash hands before and after use. If treating a hand or nail infection, wash hands before use only. Apply a thin layer of this medicine to cover the affected skin and surrounding area. You can cover the area with a sterile gauze dressing (bandage). Do not use an airtight bandage (such as a plastic-covered bandage). Do not get the medicine in your eyes. If you do, rinse out with plenty of cool tap water. Use the full course of treatment prescribed, even if you think the infection is getting better. Use at regular intervals. Do not use your medicine more often than directed. Do not use this medicine for any condition other than the one for which it was prescribed. Talk to your pediatrician regarding the use of this medicine in children. Special care may be needed. Overdosage: If you think you have taken too much of this medicine contact a poison control center or emergency room at once. NOTE: This medicine is only for you. Do not share this medicine with others. What if I miss a dose? If you miss a dose, use it as soon as you can. If it is almost time for your next dose, use only that dose. Do not use double or extra doses. What may  interact with this medicine? Interactions are not expected. Do not use any other skin products on the affected area without telling your doctor or health care professional. This list may not describe all possible interactions. Give your health care provider a list of all the medicines, herbs, non-prescription drugs, or dietary supplements you use. Also tell them if you smoke, drink alcohol, or use illegal drugs. Some items may interact with your medicine. What should I watch for while using this medicine? Tell your doctor or health care professional if your symptoms do not improve after 3 days. After bathing make sure that your skin is very dry. Fungal infections like moist conditions. Do not walk around barefoot. To help prevent reinfection, wear freshly washed cotton, not synthetic, clothing. What side effects may I notice from receiving this medicine? Side effects that usually do not require medical attention (report to your doctor or health care professional if they continue or are bothersome): -skin irritation This list may not describe all possible side effects. Call your doctor for medical advice about side effects. You may report side effects to FDA at 1-800-FDA-1088. Where should I keep my medicine? Keep out of the reach of children. Store at room temperature 15 to 30 degrees C (59 to 86 degrees F). Throw away any unused medicine after the expiration date. NOTE: This sheet is a summary. It may not cover all possible information. If you have questions about this medicine, talk to your doctor, pharmacist, or  health care provider.  2019 Elsevier/Gold Standard (2015-06-04 16:28:30)

## 2018-10-21 ENCOUNTER — Other Ambulatory Visit: Payer: Self-pay | Admitting: Family Medicine

## 2018-10-21 DIAGNOSIS — J3089 Other allergic rhinitis: Secondary | ICD-10-CM

## 2018-10-23 ENCOUNTER — Encounter: Payer: Self-pay | Admitting: Family Medicine

## 2018-10-23 ENCOUNTER — Ambulatory Visit (INDEPENDENT_AMBULATORY_CARE_PROVIDER_SITE_OTHER): Payer: BC Managed Care – PPO | Admitting: Family Medicine

## 2018-10-23 VITALS — BP 124/66 | Temp 97.1°F | Ht 64.0 in | Wt 255.0 lb

## 2018-10-23 DIAGNOSIS — E11319 Type 2 diabetes mellitus with unspecified diabetic retinopathy without macular edema: Secondary | ICD-10-CM

## 2018-10-23 DIAGNOSIS — E118 Type 2 diabetes mellitus with unspecified complications: Secondary | ICD-10-CM

## 2018-10-23 DIAGNOSIS — I10 Essential (primary) hypertension: Secondary | ICD-10-CM | POA: Diagnosis not present

## 2018-10-23 MED ORDER — FLUCONAZOLE 150 MG PO TABS: 150.0000 mg | ORAL_TABLET | ORAL | 0 refills | Status: DC

## 2018-10-23 NOTE — Progress Notes (Signed)
Virtual Visit via Video Note  I connected with Laurie Hoffman on 10/23/18 at  8:50 AM EDT by a video enabled telemedicine application and verified that I am speaking with the correct person using two identifiers.   I discussed the limitations of evaluation and management by telemedicine and the availability of in person appointments. The patient expressed understanding and agreed to proceed.  Pt was at home and I was in my office for the virtual visit.     Subjective:    CC: F/U DM   HPI:  Diabetes - no hypoglycemic events. No wounds or sores that are not healing well. No increased thirst or urination. Checking glucose at home. Taking medications as prescribed without any side effects.  Diabetic retinopathy - eye exam is up to date.    Hypertension- Pt denies chest pain, SOB, dizziness, or heart palpitations.  Taking meds as directed w/o problems.  Denies medication side effects.    Skin candidiasis - she I still struggling with a rash. She is awaiting a cream sent in by her gyn onc    Past medical history, Surgical history, Family history not pertinant except as noted below, Social history, Allergies, and medications have been entered into the medical record, reviewed, and corrections made.   Review of Systems: No fevers, chills, night sweats, weight loss, chest pain, or shortness of breath.   Objective:    General: Speaking clearly in complete sentences without any shortness of breath.  Alert and oriented x3.  Normal judgment. No apparent acute distress. Well groomed.     Impression and Recommendations:    DM - due for A1C.  Has been eating more peanut butter.  tyring to start exercise again. Due for eye exam this summer!   HTN - home BP looks great!! .   Skin candidiasis - will also send in oral diflucan treatment.     I discussed the assessment and treatment plan with the patient. The patient was provided an opportunity to ask questions and all were answered. The patient  agreed with the plan and demonstrated an understanding of the instructions.   The patient was advised to call back or seek an in-person evaluation if the symptoms worsen or if the condition fails to improve as anticipated.   Beatrice Lecher, MD

## 2018-10-30 ENCOUNTER — Encounter: Payer: Self-pay | Admitting: Family Medicine

## 2018-10-30 MED ORDER — GLIPIZIDE ER 2.5 MG PO TB24: 2.5000 mg | ORAL_TABLET | Freq: Every day | ORAL | 1 refills | Status: DC

## 2018-10-31 DIAGNOSIS — I10 Essential (primary) hypertension: Secondary | ICD-10-CM | POA: Diagnosis not present

## 2018-10-31 DIAGNOSIS — E118 Type 2 diabetes mellitus with unspecified complications: Secondary | ICD-10-CM | POA: Diagnosis not present

## 2018-11-01 LAB — COMPLETE METABOLIC PANEL WITH GFR
AG Ratio: 1.4 (calc) (ref 1.0–2.5)
ALT: 18 U/L (ref 6–29)
AST: 15 U/L (ref 10–35)
Albumin: 4.1 g/dL (ref 3.6–5.1)
Alkaline phosphatase (APISO): 71 U/L (ref 37–153)
BUN/Creatinine Ratio: 28 (calc) — ABNORMAL HIGH (ref 6–22)
BUN: 26 mg/dL — ABNORMAL HIGH (ref 7–25)
CO2: 25 mmol/L (ref 20–32)
Calcium: 9.9 mg/dL (ref 8.6–10.4)
Chloride: 98 mmol/L (ref 98–110)
Creat: 0.92 mg/dL (ref 0.50–1.05)
GFR, Est African American: 84 mL/min/{1.73_m2} (ref >=60)
GFR, Est Non African American: 72 mL/min/{1.73_m2} (ref >=60)
Globulin: 2.9 g/dL (calc) (ref 1.9–3.7)
Glucose, Bld: 109 mg/dL — ABNORMAL HIGH (ref 65–99)
Potassium: 4.5 mmol/L (ref 3.5–5.3)
Sodium: 134 mmol/L — ABNORMAL LOW (ref 135–146)
Total Bilirubin: 0.3 mg/dL (ref 0.2–1.2)
Total Protein: 7 g/dL (ref 6.1–8.1)

## 2018-11-01 LAB — LIPID PANEL
Cholesterol: 219 mg/dL — ABNORMAL HIGH (ref <200)
HDL: 52 mg/dL (ref >=50)
LDL Cholesterol (Calc): 144 mg/dL (calc) — ABNORMAL HIGH
Non-HDL Cholesterol (Calc): 167 mg/dL (calc) — ABNORMAL HIGH (ref <130)
Total CHOL/HDL Ratio: 4.2 (calc) (ref <5.0)
Triglycerides: 112 mg/dL (ref <150)

## 2018-11-01 LAB — HEMOGLOBIN A1C
Hgb A1c MFr Bld: 6.2 % of total Hgb — ABNORMAL HIGH (ref <5.7)
Mean Plasma Glucose: 131 (calc)
eAG (mmol/L): 7.3 (calc)

## 2018-11-07 ENCOUNTER — Ambulatory Visit: Payer: BLUE CROSS/BLUE SHIELD | Admitting: Gynecology

## 2018-11-21 ENCOUNTER — Other Ambulatory Visit: Payer: Self-pay | Admitting: Family Medicine

## 2018-11-21 DIAGNOSIS — E118 Type 2 diabetes mellitus with unspecified complications: Secondary | ICD-10-CM

## 2018-11-23 ENCOUNTER — Encounter: Payer: Self-pay | Admitting: Family Medicine

## 2018-11-24 NOTE — Telephone Encounter (Signed)
Awesome!! Hopefully they can help.

## 2018-12-04 ENCOUNTER — Encounter: Payer: Self-pay | Admitting: Family Medicine

## 2018-12-17 ENCOUNTER — Other Ambulatory Visit: Payer: Self-pay | Admitting: Family Medicine

## 2018-12-17 DIAGNOSIS — E119 Type 2 diabetes mellitus without complications: Secondary | ICD-10-CM

## 2018-12-18 ENCOUNTER — Other Ambulatory Visit: Payer: Self-pay | Admitting: Family Medicine

## 2018-12-25 DIAGNOSIS — E119 Type 2 diabetes mellitus without complications: Secondary | ICD-10-CM | POA: Diagnosis not present

## 2018-12-25 DIAGNOSIS — H5213 Myopia, bilateral: Secondary | ICD-10-CM | POA: Diagnosis not present

## 2018-12-25 LAB — HM DIABETES EYE EXAM

## 2019-01-11 ENCOUNTER — Other Ambulatory Visit: Payer: Self-pay | Admitting: Family Medicine

## 2019-01-12 ENCOUNTER — Telehealth: Payer: Self-pay | Admitting: *Deleted

## 2019-01-12 NOTE — Telephone Encounter (Signed)
Called and left the patient a message to call the office back. Need to move the 9/23 appt

## 2019-01-12 NOTE — Telephone Encounter (Signed)
Patient called back and moved her appt  

## 2019-01-21 NOTE — Progress Notes (Signed)
Consult Note: Gyn-Onc  Laurie Hoffman 53 y.o. female  CC:  Chief Complaint  Patient presents with  . Follow-up    HPI: 53 year old white married female gravida 0 seen in consultation request of Dr.Metheney regarding management of a vulvar lesion.  Apparently lesion is been present for a number of years.  The patient reports that it is on occasion been larger and then smaller.  She has some pain associated with the lesion.  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  Patient denies any past gynecologic history although she went through menopause at age 21.  This is the same age as her mother went through menopause.  She claims Pap smears have been normal in the past.  She does have a recent Pap smear that was normal.  Patient underwent wide local excision of the left vulvar lesion April 25, 2018.  Final pathology showed a 2.2 cm lesion with 3 mm of invasion.  Subsequently on May 11, 2018 the patient underwent left inguinal lymphadenectomy.  One lymph node was retrieved showing no evidence of metastatic disease.  Interval History:  She last saw Dr. Fermin Hoffman for a follow-up visit March 2020 and I last saw her 10/2018.  She comes in today for an interval evaluation.   There are no new medical problems in her family. She has her mammogram later today.  Her weight has somewhat plateaued.  Overall she is gone down from 345 pounds by following a low-carb diet for the past year.  However, she is recently gained a little bit of weight and she did admit to "cheating".  Her last hemoglobin A1c was below 6.  She continues using her Victoza.  She did lose weight initially with the Victoza but now again her weight is plateaued.  She does have a treadmill and her husband has just set that up for her to exercise.  They are sexually active and she denies any bleeding or discomfort with intercourse.  She otherwise has no complaints whatsoever.  Review of Systems: Constitutional: Denies  fever. Skin: + Yeast using nystatin. Cardiovascular: No chest pain, shortness of breath, or edema  Pulmonary: No cough  Gastro Intestinal: No nausea, vomiting, constipation, or diarrhea reported.  Genitourinary: Denies vaginal bleeding and discharge.  Musculoskeletal: No joint pain.  Psychology: No changes  Current Meds:  Outpatient Encounter Medications as of 01/24/2019  Medication Sig  . BAYER MICROLET LANCETS lancets Use to check blood sugar twice daily. Dx: E11.8  . Blood Glucose Monitoring Suppl (BAYER CONTOUR MONITOR) w/Device KIT 1 Device by Does not apply route as directed.  . cetirizine (ZYRTEC) 10 MG tablet Take 10 mg by mouth daily.  . fluconazole (DIFLUCAN) 150 MG tablet Take 1 tablet (150 mg total) by mouth every other day.  . fluticasone (FLONASE) 50 MCG/ACT nasal spray Place 1 spray into both nostrils daily as needed for allergies or rhinitis.  Marland Kitchen glipiZIDE (GLUCOTROL XL) 2.5 MG 24 hr tablet Take 1 tablet (2.5 mg total) by mouth daily with breakfast. TAKE 1 TABLET(2.5 MG) BY MOUTH DAILY WITH BREAKFAST  . glucose blood (BAYER CONTOUR TEST) test strip Use as instructed  . Insulin Pen Needle (NOVOTWIST) 32G X 5 MM MISC USE AS DIRECTED.  Dx Diabetes  . INVOKAMET [phone] MG TABS TAKE 1 TABLET BY MOUTH TWICE A DAY  . liraglutide (VICTOZA) 18 MG/3ML SOPN ADMINISTER 2.4 MG UNDER THE SKIN DAILY. DUE FOR FOLLOW UP  . montelukast (SINGULAIR) 10 MG tablet TAKE 1 TABLET BY  MOUTH AT BEDTIME GENERIC EQUIVALENT FOR SINGULAIR  . Multiple Vitamins-Minerals (WOMENS MULTIVITAMIN) TABS Take 1 tablet by mouth daily.  Marland Kitchen nystatin (MYCOSTATIN/NYSTOP) powder Apply topically 4 (four) times daily.  Marland Kitchen nystatin ointment (MYCOSTATIN) Apply 1 application topically 2 (two) times daily.  . simvastatin (ZOCOR) 20 MG tablet TAKE 1 TABLET(20 MG) BY MOUTH DAILY AT 6 PM  . telmisartan-hydrochlorothiazide (MICARDIS HCT) 40-12.5 MG tablet TAKE 1 TABLET BY MOUTH DAILY. NEED LABS  . XIIDRA 5 % SOLN Place 1 drop into  both eyes 2 (two) times daily.    No facility-administered encounter medications on file as of 01/24/2019.     Allergy:  Allergies  Allergen Reactions  . Atorvastatin Other (See Comments)    Leg weakness  . Latex Rash    Social Hx:   Social History   Socioeconomic History  . Marital status: Married    Spouse name: Not on file  . Number of children: Not on file  . Years of education: Not on file  . Highest education level: Not on file  Occupational History  . Not on file  Social Needs  . Financial resource strain: Not on file  . Food insecurity    Worry: Not on file    Inability: Not on file  . Transportation needs    Medical: Not on file    Non-medical: Not on file  Tobacco Use  . Smoking status: Former Smoker    Years: 2.00    Types: Cigarettes    Quit date: 05/17/1986    Years since quitting: 32.7  . Smokeless tobacco: Never Used  . Tobacco comment: 1 pack per week  Substance and Sexual Activity  . Alcohol use: Yes    Comment: very rare  . Drug use: No  . Sexual activity: Not Currently    Birth control/protection: None  Lifestyle  . Physical activity    Days per week: Not on file    Minutes per session: Not on file  . Stress: Not on file  Relationships  . Social Herbalist on phone: Not on file    Gets together: Not on file    Attends religious service: Not on file    Active member of club or organization: Not on file    Attends meetings of clubs or organizations: Not on file    Relationship status: Not on file  . Intimate partner violence    Fear of current or ex partner: Not on file    Emotionally abused: Not on file    Physically abused: Not on file    Forced sexual activity: Not on file  Other Topics Concern  . Not on file  Social History Narrative  . Not on file    Past Surgical Hx:  Past Surgical History:  Procedure Laterality Date  . LYMPH NODE DISSECTION Left 05/11/2018   Procedure: LEFT INGUINAL LYMPH NODE  DISSECTION;   Surgeon: [name], MD;  Location: [facility];  Service: Gynecology;  Laterality: Left;  . NO PAST SURGERIES    . VULVECTOMY N/A 04/25/2018   Procedure: WIDE EXCISION VULVECTOMY;  Surgeon: Marti Sleigh, MD;  Location: [facility];  Service: Gynecology;  Laterality: N/A;    Past Medical Hx:  Past Medical History:  Diagnosis Date  . Cellulitis 04/29/2018   Labia majora  . Dry eye syndrome   . Dyspnea   . Eczema   . Hypertension   . Macular degeneration   . Obesity   .  Seasonal allergies   . Type 2 diabetes mellitus ([place])    followed by pcp  . VIN III (vulvar intraepithelial neoplasia III)   . Vulvar cancer ([place])    Stage IB  . Wears glasses     Oncology Hx:  Oncology History   No history exists.    Family Hx:  Family History  Problem Relation Age of Onset  . Diabetes Mother        Insulin  . Heart attack Mother   . Cancer Father 25       melanoma  . Diabetes Sister        Insulin  . Hypertension Sister     Vitals:  Blood pressure (!) 145/73, pulse 85, temperature 98.2 F (36.8 C), temperature source Tympanic, resp. rate 20, height _0  (1.626 m), weight 265 lb (120.2 kg).  Physical Exam: Well-nourished well-developed female in no acute distress.    Neck: Supple, no lymphadenopathy, no thyromegaly.  Abdomen: Morbidly obese.  Abdomen is soft and nontender.  There are no palpable masses or hepatosplenomegaly but exam is limited by habitus.  Small patches of persistent Candida under the pannus.  Markedly improved from her last visit.  No Candida on the anterior thigh.  She does have bruising across the upper portion of her abdomen secondary to her subcutaneous injections.  Pelvic: External genitalia is notable for a well-healing left vulvar incision.  The entire vulva was inspected and there are no visible lesions.  There are no palpable abnormalities.  Groin her left groin incision is well-healed.  I cannot appreciate any lymphadenopathy  though exam is limited by habitus.  Extremities: No edema.   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  She will follow-up with her other physicians as scheduled and return to see [place] in 3 months. Her visits can be extended to every 4 months after her one year anniversary.  She was congratulated on her significant weight loss but encouraged to keep up her efforts and to increase her exercise.  She states that she is planning to do that with her treadmill. Love Chowning A., MD 01/24/2019, 12:17 PM

## 2019-01-23 ENCOUNTER — Other Ambulatory Visit: Payer: Self-pay | Admitting: Family Medicine

## 2019-01-23 ENCOUNTER — Other Ambulatory Visit (HOSPITAL_BASED_OUTPATIENT_CLINIC_OR_DEPARTMENT_OTHER): Payer: Self-pay | Admitting: Family Medicine

## 2019-01-23 DIAGNOSIS — Z1231 Encounter for screening mammogram for malignant neoplasm of breast: Secondary | ICD-10-CM

## 2019-01-24 ENCOUNTER — Ambulatory Visit (INDEPENDENT_AMBULATORY_CARE_PROVIDER_SITE_OTHER): Payer: BC Managed Care – PPO

## 2019-01-24 ENCOUNTER — Other Ambulatory Visit: Payer: Self-pay

## 2019-01-24 ENCOUNTER — Inpatient Hospital Stay: Payer: BC Managed Care – PPO | Attending: Gynecologic Oncology | Admitting: Gynecologic Oncology

## 2019-01-24 VITALS — BP 145/73 | HR 85 | Temp 98.2°F | Resp 20 | Ht 64.0 in | Wt 265.0 lb

## 2019-01-24 DIAGNOSIS — Z1231 Encounter for screening mammogram for malignant neoplasm of breast: Secondary | ICD-10-CM

## 2019-01-24 DIAGNOSIS — C519 Malignant neoplasm of vulva, unspecified: Secondary | ICD-10-CM | POA: Insufficient documentation

## 2019-01-24 NOTE — Patient Instructions (Signed)
Please return to see [place] in 3 months.  As that will be your one-year anniversary we will then change her appointments to every 4 months.

## 2019-02-04 ENCOUNTER — Other Ambulatory Visit: Payer: Self-pay | Admitting: Family Medicine

## 2019-02-07 ENCOUNTER — Ambulatory Visit: Payer: BC Managed Care – PPO | Admitting: Gynecology

## 2019-02-07 ENCOUNTER — Encounter: Payer: Self-pay | Admitting: Family Medicine

## 2019-02-22 ENCOUNTER — Encounter: Payer: Self-pay | Admitting: Family Medicine

## 2019-02-22 MED ORDER — SHINGRIX 50 MCG/0.5ML IM SUSR: 0.5000 mL | Freq: Once | INTRAMUSCULAR | 1 refills | Status: AC

## 2019-03-12 ENCOUNTER — Other Ambulatory Visit: Payer: Self-pay | Admitting: Family Medicine

## 2019-03-12 DIAGNOSIS — B372 Candidiasis of skin and nail: Secondary | ICD-10-CM

## 2019-04-08 ENCOUNTER — Other Ambulatory Visit: Payer: Self-pay | Admitting: Family Medicine

## 2019-04-30 NOTE — Progress Notes (Signed)
Consult Note: Gyn-Onc  Laurie Hoffman 53 y.o. female  CC:  Chief Complaint  Patient presents with  . Vulvar cancer [facility])    HPI: 53 year old white married female gravida 0 seen in consultation request of LaurieMetheney regarding management of a vulvar lesion.  Apparently lesion is been present for a number of years.  The patient reports that it is on occasion been larger and then smaller.  She has some pain associated with the lesion.  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  Patient denies any past gynecologic history although she went through menopause at age 54.  This is the same age as her mother went through menopause.  She claims Pap smears have been normal in the past.  She does have a recent Pap smear that was normal.  Patient underwent wide local excision of the left vulvar lesion April 25, 2018.  Final pathology showed a 2.2 cm lesion with 3 mm of invasion.  Subsequently on May 11, 2018 the patient underwent left inguinal lymphadenectomy.  One lymph node was retrieved showing no evidence of metastatic disease.  Interval History:  She last saw Dr. Fermin Hoffman for a follow-up visit March 2020 and I last saw her 01/2019.  She comes in today for an interval evaluation.  She is overall doing quite well.  She had a recurrence of her yeast and started using her nystatin again.  She denies any vaginal or vulvar itching or bleeding.  She is very pleased that it is her 1 year anniversary.  She is concerned about the Covid vaccine and had questions about whether or not we as providers would recommend it.  Her last hemoglobin A1c 3 months ago was in the 6 range.  She does not routinely check her sugars at home as she has not been instructed to do this by her providers.  However, when she has checked them they have not been markedly elevated.  She has no new medical issues.  There are no new medical problems in her family.  She is a little saddened as this will be the second  year that she has not spent the holidays with her family.  Last year she did not secondary to having had surgery for her vulvar cancer and then this year because of the pandemic.  Review of Systems: Constitutional: Denies fever. Skin: + Yeast using nystatin. Cardiovascular: No chest pain, shortness of breath, or edema  Pulmonary: No cough  Gastro Intestinal: No nausea, vomiting, constipation, or diarrhea reported.  Genitourinary: Denies vaginal bleeding and discharge.  Musculoskeletal: No joint pain.  Psychology: No changes  Current Meds:  Outpatient Encounter Medications as of 05/02/2019  Medication Sig  . BAYER MICROLET LANCETS lancets Use to check blood sugar twice daily. Dx: E11.8  . Blood Glucose Monitoring Suppl (BAYER CONTOUR MONITOR) w/Device KIT 1 Device by Does not apply route as directed.  . cetirizine (ZYRTEC) 10 MG tablet Take 10 mg by mouth daily.  . fluconazole (DIFLUCAN) 150 MG tablet Take 1 tablet (150 mg total) by mouth every other day.  . fluticasone (FLONASE) 50 MCG/ACT nasal spray Place 1 spray into both nostrils daily as needed for allergies or rhinitis.  Marland Kitchen glipiZIDE (GLUCOTROL XL) 2.5 MG 24 hr tablet TAKE 1 TABLET BY MOUTH DAILY WITH BREAKFAST  . glucose blood (BAYER CONTOUR TEST) test strip Use as instructed  . Insulin Pen Needle (NOVOTWIST) 32G X 5 MM MISC USE AS DIRECTED.  Dx Diabetes  . INVOKAMET [phone] MG  TABS TAKE 1 TABLET BY MOUTH TWICE A DAY  . liraglutide (VICTOZA) 18 MG/3ML SOPN ADMINISTER 2.4 MG UNDER THE SKIN DAILY. DUE FOR FOLLOW UP  . montelukast (SINGULAIR) 10 MG tablet TAKE 1 TABLET BY MOUTH AT BEDTIME GENERIC EQUIVALENT FOR SINGULAIR  . Multiple Vitamins-Minerals (WOMENS MULTIVITAMIN) TABS Take 1 tablet by mouth daily.  Marland Kitchen nystatin (MYCOSTATIN/NYSTOP) powder APPLY TO AFFECTED AREA 4 TIMES A DAY  . nystatin ointment (MYCOSTATIN) Apply 1 application topically 2 (two) times daily.  . simvastatin (ZOCOR) 20 MG tablet TAKE 1 TABLET(20 MG) BY MOUTH  DAILY AT 6 PM  . telmisartan-hydrochlorothiazide (MICARDIS HCT) 40-12.5 MG tablet Take 1 tablet by mouth daily.  Marland Kitchen XIIDRA 5 % SOLN Place 1 drop into both eyes 2 (two) times daily.    No facility-administered encounter medications on file as of 05/02/2019.    Allergy:  Allergies  Allergen Reactions  . Atorvastatin Other (See Comments)    Leg weakness  . Latex Rash    Social Hx:   Social History   Socioeconomic History  . Marital status: Married    Spouse name: Not on file  . Number of children: Not on file  . Years of education: Not on file  . Highest education level: Not on file  Occupational History  . Not on file  Tobacco Use  . Smoking status: Former Smoker    Years: 2.00    Types: Cigarettes    Quit date: 05/17/1986    Years since quitting: 32.9  . Smokeless tobacco: Never Used  . Tobacco comment: 1 pack per week  Substance and Sexual Activity  . Alcohol use: Yes    Comment: very rare  . Drug use: No  . Sexual activity: Not Currently    Birth control/protection: None  Other Topics Concern  . Not on file  Social History Narrative  . Not on file   Social Determinants of Health   Financial Resource Strain:   . Difficulty of Paying Living Expenses: Not on file  Food Insecurity:   . Worried About Charity fundraiser in the Last Year: Not on file  . Ran Out of Food in the Last Year: Not on file  Transportation Needs:   . Lack of Transportation (Medical): Not on file  . Lack of Transportation (Non-Medical): Not on file  Physical Activity:   . Days of Exercise per Week: Not on file  . Minutes of Exercise per Session: Not on file  Stress:   . Feeling of Stress : Not on file  Social Connections:   . Frequency of Communication with Friends and Family: Not on file  . Frequency of Social Gatherings with Friends and Family: Not on file  . Attends Religious Services: Not on file  . Active Member of Clubs or Organizations: Not on file  . Attends Archivist  Meetings: Not on file  . Marital Status: Not on file  Intimate Partner Violence:   . Fear of Current or Ex-Partner: Not on file  . Emotionally Abused: Not on file  . Physically Abused: Not on file  . Sexually Abused: Not on file    Past Surgical Hx:  Past Surgical History:  Procedure Laterality Date  . LYMPH NODE DISSECTION Left 05/11/2018   Procedure: LEFT INGUINAL LYMPH NODE  DISSECTION;  Surgeon: [name], MD;  Location: [facility];  Service: Gynecology;  Laterality: Left;  . NO PAST SURGERIES    . VULVECTOMY N/A 04/25/2018   Procedure: WIDE EXCISION  VULVECTOMY;  Surgeon: Marti Sleigh, MD;  Location[facility];  Service: Gynecology;  Laterality: N/A;    Past Medical Hx:  Past Medical History:  Diagnosis Date  . Cellulitis 04/29/2018   Labia majora  . Dry eye syndrome   . Dyspnea   . Eczema   . Hypertension   . Macular degeneration   . Obesity   . Seasonal allergies   . Type 2 diabetes mellitus ([place])    followed by pcp  . VIN III (vulvar intraepithelial neoplasia III)   . Vulvar cancer ([place])    Stage IB  . Wears glasses     Oncology Hx:  Oncology History   No history exists.    Family Hx:  Family History  Problem Relation Age of Onset  . Diabetes Mother        Insulin  . Heart attack Mother   . Cancer Father 56       melanoma  . Diabetes Sister        Insulin  . Hypertension Sister     Vitals:  Blood pressure 134/76, pulse 91, temperature 97.8 F (36.6 C), temperature source Temporal, resp. rate 20, height 5' 4"  (1.626 m), weight 269 lb 14.4 oz (122.4 kg), SpO2 100 %.  Physical Exam: Well-nourished well-developed female in no acute distress.    Neck: Supple, no lymphadenopathy, no thyromegaly.  Abdomen: Morbidly obese.  Abdomen is soft and nontender.  There are no palpable masses or hepatosplenomegaly but exam is limited by habitus. Large  patch of persistent Candida under the pannus, mons, anterior upper thigh. She  does have bruising across the lower mid portion of her abdomen secondary to her subcutaneous injections.  Pelvic: External genitalia is notable for a well-healing left vulvar incision.  The entire vulva was inspected and there are no visible lesions.  There are no palpable abnormalities.  Groin her left groin incision is well-healed.  I cannot appreciate any lymphadenopathy though exam is limited by habitus.  Extremities: No edema.   Assessment/Plan: 54 year old with stage 1B  Stage Ib squamous cell carcinoma of the vulva s/p definitive surgery 04/2018.  She will follow-up with her other physicians as scheduled and return to see [place] in 3-4 months.  She was congratulated on her 1 year anniversary.  She will follow up with her other providers as scheduled.   Jarquez Mestre A., MD 05/02/2019, 11:10 AM

## 2019-05-02 ENCOUNTER — Other Ambulatory Visit: Payer: Self-pay

## 2019-05-02 ENCOUNTER — Encounter: Payer: Self-pay | Admitting: Gynecologic Oncology

## 2019-05-02 ENCOUNTER — Inpatient Hospital Stay: Payer: BC Managed Care – PPO | Attending: Gynecologic Oncology | Admitting: Gynecologic Oncology

## 2019-05-02 VITALS — BP 134/76 | HR 91 | Temp 97.8°F | Resp 20 | Ht 64.0 in | Wt 269.9 lb

## 2019-05-02 DIAGNOSIS — Z780 Asymptomatic menopausal state: Secondary | ICD-10-CM | POA: Insufficient documentation

## 2019-05-02 DIAGNOSIS — C519 Malignant neoplasm of vulva, unspecified: Secondary | ICD-10-CM | POA: Diagnosis not present

## 2019-05-02 NOTE — Patient Instructions (Signed)
Please return to see [place] in 3 to 4 months.

## 2019-06-07 ENCOUNTER — Other Ambulatory Visit: Payer: Self-pay | Admitting: Family Medicine

## 2019-06-07 DIAGNOSIS — E118 Type 2 diabetes mellitus with unspecified complications: Secondary | ICD-10-CM

## 2019-06-27 ENCOUNTER — Other Ambulatory Visit: Payer: Self-pay | Admitting: Family Medicine

## 2019-06-27 DIAGNOSIS — E119 Type 2 diabetes mellitus without complications: Secondary | ICD-10-CM

## 2019-07-21 ENCOUNTER — Other Ambulatory Visit: Payer: Self-pay | Admitting: Family Medicine

## 2019-07-21 DIAGNOSIS — E119 Type 2 diabetes mellitus without complications: Secondary | ICD-10-CM

## 2019-08-01 ENCOUNTER — Encounter: Payer: Self-pay | Admitting: Obstetrics & Gynecology

## 2019-08-01 ENCOUNTER — Other Ambulatory Visit: Payer: Self-pay

## 2019-08-01 ENCOUNTER — Inpatient Hospital Stay: Payer: BC Managed Care – PPO | Attending: Gynecologic Oncology | Admitting: Obstetrics & Gynecology

## 2019-08-01 VITALS — BP 131/70 | HR 101 | Temp 98.5°F | Ht 64.0 in | Wt 269.4 lb

## 2019-08-01 DIAGNOSIS — C519 Malignant neoplasm of vulva, unspecified: Secondary | ICD-10-CM

## 2019-08-01 DIAGNOSIS — E119 Type 2 diabetes mellitus without complications: Secondary | ICD-10-CM | POA: Insufficient documentation

## 2019-08-01 DIAGNOSIS — Z808 Family history of malignant neoplasm of other organs or systems: Secondary | ICD-10-CM | POA: Diagnosis not present

## 2019-08-01 DIAGNOSIS — E669 Obesity, unspecified: Secondary | ICD-10-CM | POA: Diagnosis not present

## 2019-08-01 DIAGNOSIS — Z8544 Personal history of malignant neoplasm of other female genital organs: Secondary | ICD-10-CM

## 2019-08-01 DIAGNOSIS — Z833 Family history of diabetes mellitus: Secondary | ICD-10-CM | POA: Diagnosis not present

## 2019-08-01 DIAGNOSIS — Z8249 Family history of ischemic heart disease and other diseases of the circulatory system: Secondary | ICD-10-CM | POA: Insufficient documentation

## 2019-08-01 DIAGNOSIS — Z79899 Other long term (current) drug therapy: Secondary | ICD-10-CM | POA: Insufficient documentation

## 2019-08-01 DIAGNOSIS — I10 Essential (primary) hypertension: Secondary | ICD-10-CM | POA: Diagnosis not present

## 2019-08-01 DIAGNOSIS — Z87891 Personal history of nicotine dependence: Secondary | ICD-10-CM | POA: Diagnosis not present

## 2019-08-01 DIAGNOSIS — Z794 Long term (current) use of insulin: Secondary | ICD-10-CM | POA: Insufficient documentation

## 2019-08-01 NOTE — Patient Instructions (Signed)
Return in 4 months

## 2019-08-01 NOTE — Assessment & Plan Note (Addendum)
1B Stage Ib squamous cell carcinoma of the vulva s/p definitive surgery 04/2018 Negative symptom review, normal exam  >follow-up in 4 mths

## 2019-08-01 NOTE — Progress Notes (Signed)
Follow Up Note: Gyn-Onc  Laurie Hoffman 54 y.o. female  CC: Follow-up visit.  H/O  stage 1B Stage Ib squamous cell carcinoma of the vulva  HPI: Patient underwent wide local excision of the left vulvar lesion April 25, 2018. Final pathology showed a 2.2 cm lesion with 3 mm of invasion.  Subsequently on May 11, 2018 the patient underwent left inguinal lymphadenectomy. One lymph node was retrieved showing no evidence of metastatic disease.  She has seen Drs Fermin Schwab and Alycia Rossetti for follow-up visits w/o any evidence of residual disease or recurrence.    Interval History: She presents today for interval evaluation.  No lesions, itching, leg/groin pain, urinary symptoms, weight loss or cough.      Review of Systems  Review of Systems  Constitutional: Negative for weight loss.  Respiratory: Negative for cough.   Genitourinary: Negative.     Current Meds:  Outpatient Encounter Medications as of 08/01/2019  Medication Sig  . BAYER MICROLET LANCETS lancets Use to check blood sugar twice daily. Dx: E11.8  . Blood Glucose Monitoring Suppl (BAYER CONTOUR MONITOR) w/Device KIT 1 Device by Does not apply route as directed.  . cetirizine (ZYRTEC) 10 MG tablet Take 10 mg by mouth daily.  . fluconazole (DIFLUCAN) 150 MG tablet Take 1 tablet (150 mg total) by mouth every other day.  . fluticasone (FLONASE) 50 MCG/ACT nasal spray Place 1 spray into both nostrils daily as needed for allergies or rhinitis.  Marland Kitchen glipiZIDE (GLUCOTROL XL) 2.5 MG 24 hr tablet TAKE 1 TABLET BY MOUTH DAILY WITH BREAKFAST  . glucose blood (BAYER CONTOUR TEST) test strip Use as instructed  . Insulin Pen Needle (NOVOTWIST) 32G X 5 MM MISC USE AS DIRECTED.  Dx Diabetes  . INVOKAMET [phone] MG TABS TAKE 1 TABLET BY MOUTH TWICE A DAY  . montelukast (SINGULAIR) 10 MG tablet TAKE 1 TABLET BY MOUTH AT BEDTIME GENERIC EQUIVALENT FOR SINGULAIR  .  Multiple Vitamins-Minerals (WOMENS MULTIVITAMIN) TABS Take 1 tablet by mouth daily.  Marland Kitchen nystatin (MYCOSTATIN/NYSTOP) powder APPLY TO AFFECTED AREA 4 TIMES A DAY  . nystatin ointment (MYCOSTATIN) Apply 1 application topically 2 (two) times daily.  . simvastatin (ZOCOR) 20 MG tablet TAKE 1 TABLET(20 MG) BY MOUTH DAILY AT 6 PM  . telmisartan-hydrochlorothiazide (MICARDIS HCT) 40-12.5 MG tablet Take 1 tablet by mouth daily.  Marland Kitchen VICTOZA 18 MG/3ML SOPN ADMINISTER 2.4 MG UNDER THE SKIN DAILY. DUE FOR FOLLOW UP  . XIIDRA 5 % SOLN Place 1 drop into both eyes 2 (two) times daily.    No facility-administered encounter medications on file as of 08/01/2019.    Allergy:  Allergies  Allergen Reactions  . Atorvastatin Other (See Comments)    Leg weakness  . Latex Rash    Social Hx:   Social History   Socioeconomic History  . Marital status: Married    Spouse name: Not on file  . Number of children: Not on file  . Years of education: Not on file  . Highest education level: Not on file  Occupational History  . Not on file  Tobacco Use  . Smoking status: Former Smoker    Years: 2.00    Types: Cigarettes    Quit date: 05/17/1986    Years since quitting: 33.2  . Smokeless tobacco: Never Used  . Tobacco comment: 1 pack per week  Substance and Sexual Activity  . Alcohol use: Yes    Comment: very rare  . Drug use: No  . Sexual activity: Not Currently  Birth control/protection: None  Other Topics Concern  . Not on file  Social History Narrative  . Not on file   Social Determinants of Health   Financial Resource Strain:   . Difficulty of Paying Living Expenses:   Food Insecurity:   . Worried About Charity fundraiser in the Last Year:   . Arboriculturist in the Last Year:   Transportation Needs:   . Film/video editor (Medical):   Marland Kitchen Lack of Transportation (Non-Medical):   Physical Activity:   . Days of Exercise per Week:   . Minutes of Exercise per Session:   Stress:   . Feeling  of Stress :   Social Connections:   . Frequency of Communication with Friends and Family:   . Frequency of Social Gatherings with Friends and Family:   . Attends Religious Services:   . Active Member of Clubs or Organizations:   . Attends Archivist Meetings:   Marland Kitchen Marital Status:   Intimate Partner Violence:   . Fear of Current or Ex-Partner:   . Emotionally Abused:   Marland Kitchen Physically Abused:   . Sexually Abused:     Past Surgical Hx:  Past Surgical History:  Procedure Laterality Date  . LYMPH NODE DISSECTION Left 05/11/2018   Procedure: LEFT INGUINAL LYMPH NODE  DISSECTION;  Surgeon: [name], MD;  Location: [facility];  Service: Gynecology;  Laterality: Left;  . NO PAST SURGERIES    . VULVECTOMY N/A 04/25/2018   Procedure: WIDE EXCISION VULVECTOMY;  Surgeon: Marti Sleigh, MD;  Location[facility];  Service: Gynecology;  Laterality: N/A;    Past Medical Hx:  Past Medical History:  Diagnosis Date  . Cellulitis 04/29/2018   Labia majora  . Dry eye syndrome   . Dyspnea   . Eczema   . Hypertension   . Macular degeneration   . Obesity   . Seasonal allergies   . Type 2 diabetes mellitus ([place])    followed by pcp  . VIN III (vulvar intraepithelial neoplasia III)   . Vulvar cancer ([place])    Stage IB  . Wears glasses     Family Hx:  Family History  Problem Relation Age of Onset  . Diabetes Mother        Insulin  . Heart attack Mother   . Cancer Father 62       melanoma  . Diabetes Sister        Insulin  . Hypertension Sister     Vitals:  There were no vitals taken for this visit.  Physical Exam:  General: alert GI: soft, non-tender; bowel sounds normal; no masses,  no organomegaly; erythematous plaque under pannus, groin Pelvic: Atrophy/ no lesions, palpable abnormalities Lymphatics: no supraclavicular, inguinal lymphadenopath   Assessment/Plan:  Vulvar cancer ([facility]) 1B Stage Ib squamous cell carcinoma of the vulva s/p  definitive surgery 04/2018 Negative symptom review, normal exam  >follow-up in 3 mths  I personally spent 25 minutes face-to-face and non-face-to-face in the care of this patient, which includes all pre, intra, and post visit time on the date of service.  Lahoma Crocker, MD 08/01/2019, 10:37 AM

## 2019-08-15 ENCOUNTER — Other Ambulatory Visit: Payer: Self-pay | Admitting: Family Medicine

## 2019-08-15 DIAGNOSIS — E119 Type 2 diabetes mellitus without complications: Secondary | ICD-10-CM

## 2019-09-02 ENCOUNTER — Other Ambulatory Visit: Payer: Self-pay | Admitting: Family Medicine

## 2019-09-03 ENCOUNTER — Ambulatory Visit (INDEPENDENT_AMBULATORY_CARE_PROVIDER_SITE_OTHER): Payer: BC Managed Care – PPO | Admitting: Family Medicine

## 2019-09-03 ENCOUNTER — Other Ambulatory Visit: Payer: Self-pay

## 2019-09-03 ENCOUNTER — Encounter: Payer: Self-pay | Admitting: Family Medicine

## 2019-09-03 VITALS — BP 129/75 | HR 82 | Ht 64.0 in | Wt 274.0 lb

## 2019-09-03 DIAGNOSIS — E785 Hyperlipidemia, unspecified: Secondary | ICD-10-CM

## 2019-09-03 DIAGNOSIS — E118 Type 2 diabetes mellitus with unspecified complications: Secondary | ICD-10-CM

## 2019-09-03 DIAGNOSIS — E11319 Type 2 diabetes mellitus with unspecified diabetic retinopathy without macular edema: Secondary | ICD-10-CM

## 2019-09-03 DIAGNOSIS — E1169 Type 2 diabetes mellitus with other specified complication: Secondary | ICD-10-CM | POA: Diagnosis not present

## 2019-09-03 DIAGNOSIS — Z1211 Encounter for screening for malignant neoplasm of colon: Secondary | ICD-10-CM

## 2019-09-03 DIAGNOSIS — I10 Essential (primary) hypertension: Secondary | ICD-10-CM

## 2019-09-03 DIAGNOSIS — Z6841 Body Mass Index (BMI) 40.0 and over, adult: Secondary | ICD-10-CM

## 2019-09-03 LAB — POCT GLYCOSYLATED HEMOGLOBIN (HGB A1C): Hemoglobin A1C: 6.7 % — AB (ref 4.0–5.6)

## 2019-09-03 MED ORDER — VICTOZA 18 MG/3ML ~~LOC~~ SOPN: 2.4000 mg | PEN_INJECTOR | Freq: Every day | SUBCUTANEOUS | 3 refills | Status: DC

## 2019-09-03 NOTE — Assessment & Plan Note (Signed)
Due to recheck lipids.  Continue simvastatin.

## 2019-09-03 NOTE — Progress Notes (Signed)
Established Patient Office Visit  Subjective:  Patient ID: Laurie Hoffman, female    DOB: 06-Apr-1966  Age: 54 y.o. MRN: [id number]  CC:  Chief Complaint  Patient presents with  . Diabetes  . Hypertension    HPI Jaydan Chretien presents for  Diabetes - no hypoglycemic events. No wounds or sores that are not healing well. No increased thirst or urination. Checking glucose at home. Taking medications as prescribed without any side effects.  Hypertension- Pt denies chest pain, SOB, dizziness, or heart palpitations.  Taking meds as directed w/o problems.  Denies medication side effects.    Diabetic retinopathy - eye exam is up to date  Hyperlipidemia-last time we checked her lipids last summer they had jumped up significantly compared to prior year.  BMI 47-she did discuss that her mom who is currently in rehab is gone to be coming to live with her.  This has been a little bit stressful but she is actually really looking forward to her mom being there and says she really wants for them to get on track with her diet and exercise together.  Her mother was recently diagnosed with renal failure and is on dialysis.  Past Medical History:  Diagnosis Date  . Cellulitis 04/29/2018   Labia majora  . Dry eye syndrome   . Dyspnea   . Eczema   . Hypertension   . Macular degeneration   . Obesity   . Seasonal allergies   . Type 2 diabetes mellitus ([place])    followed by pcp  . VIN III (vulvar intraepithelial neoplasia III)   . Vulvar cancer ([place])    Stage IB  . Wears glasses     Past Surgical History:  Procedure Laterality Date  . LYMPH NODE DISSECTION Left 05/11/2018   Procedure: LEFT INGUINAL LYMPH NODE  DISSECTION;  Surgeon: [name], MD;  Location: [facility];  Service: Gynecology;  Laterality: Left;  . NO PAST SURGERIES    . VULVECTOMY N/A 04/25/2018   Procedure: WIDE EXCISION VULVECTOMY;  Surgeon: Marti Sleigh, MD;  Location: [facility];  Service:  Gynecology;  Laterality: N/A;    Family History  Problem Relation Age of Onset  . Diabetes Mother        Insulin  . Heart attack Mother   . Cancer Father 72       melanoma  . Diabetes Sister        Insulin  . Hypertension Sister     Social History   Socioeconomic History  . Marital status: Married    Spouse name: Not on file  . Number of children: Not on file  . Years of education: Not on file  . Highest education level: Not on file  Occupational History  . Not on file  Tobacco Use  . Smoking status: Former Smoker    Years: 2.00    Types: Cigarettes    Quit date: 05/17/1986    Years since quitting: 33.3  . Smokeless tobacco: Never Used  . Tobacco comment: 1 pack per week  Substance and Sexual Activity  . Alcohol use: Yes    Comment: very rare  . Drug use: No  . Sexual activity: Not Currently    Birth control/protection: None  Other Topics Concern  . Not on file  Social History Narrative  . Not on file   Social Determinants of Health   Financial Resource Strain:   . Difficulty of Paying Living Expenses:   Food Insecurity:   .  Worried About Charity fundraiser in the Last Year:   . Arboriculturist in the Last Year:   Transportation Needs:   . Film/video editor (Medical):   Marland Kitchen Lack of Transportation (Non-Medical):   Physical Activity:   . Days of Exercise per Week:   . Minutes of Exercise per Session:   Stress:   . Feeling of Stress :   Social Connections:   . Frequency of Communication with Friends and Family:   . Frequency of Social Gatherings with Friends and Family:   . Attends Religious Services:   . Active Member of Clubs or Organizations:   . Attends Archivist Meetings:   Marland Kitchen Marital Status:   Intimate Partner Violence:   . Fear of Current or Ex-Partner:   . Emotionally Abused:   Marland Kitchen Physically Abused:   . Sexually Abused:     Outpatient Medications Prior to Visit  Medication Sig Dispense Refill  . cetirizine (ZYRTEC) 10 MG  tablet Take 10 mg by mouth daily.    . fluticasone (FLONASE) 50 MCG/ACT nasal spray Place 1 spray into both nostrils daily as needed for allergies or rhinitis.    Marland Kitchen glipiZIDE (GLUCOTROL XL) 2.5 MG 24 hr tablet TAKE 1 TABLET BY MOUTH DAILY WITH BREAKFAST 90 tablet 1  . Insulin Pen Needle (NOVOTWIST) 32G X 5 MM MISC USE AS DIRECTED.  Dx Diabetes 100 each 11  . INVOKAMET [phone] MG TABS TAKE 1 TABLET BY MOUTH TWICE A DAY 180 tablet 1  . montelukast (SINGULAIR) 10 MG tablet TAKE 1 TABLET BY MOUTH AT BEDTIME GENERIC EQUIVALENT FOR SINGULAIR 90 tablet 3  . Multiple Vitamins-Minerals (WOMENS MULTIVITAMIN) TABS Take 1 tablet by mouth daily.    Marland Kitchen nystatin (MYCOSTATIN/NYSTOP) powder APPLY TO AFFECTED AREA 4 TIMES A DAY 60 g 2  . nystatin ointment (MYCOSTATIN) Apply 1 application topically 2 (two) times daily. 30 g 6  . simvastatin (ZOCOR) 20 MG tablet TAKE 1 TABLET(20 MG) BY MOUTH DAILY AT 6 PM 90 tablet 3  . telmisartan-hydrochlorothiazide (MICARDIS HCT) 40-12.5 MG tablet TAKE 1 TABLET BY MOUTH EVERY DAY 90 tablet 1  . XIIDRA 5 % SOLN Place 1 drop into both eyes 2 (two) times daily.   11  . BAYER MICROLET LANCETS lancets Use to check blood sugar twice daily. Dx: E11.8 100 each 12  . Blood Glucose Monitoring Suppl (BAYER CONTOUR MONITOR) w/Device KIT 1 Device by Does not apply route as directed. 1 kit 0  . fluconazole (DIFLUCAN) 150 MG tablet Take 1 tablet (150 mg total) by mouth every other day. 3 tablet 0  . glucose blood (BAYER CONTOUR TEST) test strip Use as instructed 100 each 12  . VICTOZA 18 MG/3ML SOPN ADMINISTER 2.4 MG UNDER THE SKIN DAILY. DUE FOR FOLLOW UP 3 mL 0   No facility-administered medications prior to visit.    Allergies  Allergen Reactions  . Atorvastatin Other (See Comments)    Leg weakness  . Latex Rash    ROS Review of Systems    Objective:    Physical Exam  Constitutional: She is oriented to person, place, and time. She appears well-developed and well-nourished.   HENT:  Head: Normocephalic and atraumatic.  Cardiovascular: Normal rate, regular rhythm and normal heart sounds.  Pulmonary/Chest: Effort normal and breath sounds normal.  Neurological: She is alert and oriented to person, place, and time.  Skin: Skin is warm and dry.  Psychiatric: She has a normal mood and affect. Her behavior  is normal.    BP 129/75   Pulse 82   Ht 5' 4"  (1.626 m)   Wt 274 lb (124.3 kg)   SpO2 100%   BMI 47.03 kg/m  Wt Readings from Last 3 Encounters:  09/03/19 274 lb (124.3 kg)  08/01/19 269 lb 6 oz (122.2 kg)  05/02/19 269 lb 14.4 oz (122.4 kg)     Health Maintenance Due  Topic Date Due  . HIV Screening  Never done  . COLONOSCOPY  Never done    There are no preventive care reminders to display for this patient.  Lab Results  Component Value Date   TSH 2.800 12/23/2016   Lab Results  Component Value Date   WBC 6.6 05/08/2018   HGB 15.0 05/08/2018   HCT 46.4 (H) 05/08/2018   MCV 92.6 05/08/2018   PLT 299 05/08/2018   Lab Results  Component Value Date   NA 134 (L) 10/31/2018   K 4.5 10/31/2018   CO2 25 10/31/2018   GLUCOSE 109 (H) 10/31/2018   BUN 26 (H) 10/31/2018   CREATININE 0.92 10/31/2018   BILITOT 0.3 10/31/2018   ALKPHOS 65 05/08/2018   AST 15 10/31/2018   ALT 18 10/31/2018   PROT 7.0 10/31/2018   ALBUMIN 4.2 05/08/2018   CALCIUM 9.9 10/31/2018   ANIONGAP 11 05/08/2018   Lab Results  Component Value Date   CHOL 219 (H) 10/31/2018   Lab Results  Component Value Date   HDL 52 10/31/2018   Lab Results  Component Value Date   LDLCALC 144 (H) 10/31/2018   Lab Results  Component Value Date   TRIG 112 10/31/2018   Lab Results  Component Value Date   CHOLHDL 4.2 10/31/2018   Lab Results  Component Value Date   HGBA1C 6.7 (A) 09/03/2019      Assessment & Plan:   Problem List Items Addressed This Visit      Cardiovascular and Mediastinum   ESSENTIAL HYPERTENSION, BENIGN    Well controlled. Continue current  regimen. Follow up in  6 mo      Relevant Orders   COMPLETE METABOLIC PANEL WITH GFR   Lipid panel     Endocrine   Hyperlipidemia associated with type 2 diabetes mellitus ([place])    Due to recheck lipids.  Continue simvastatin.      Relevant Medications   liraglutide (VICTOZA) 18 MG/3ML SOPN   Other Relevant Orders   COMPLETE METABOLIC PANEL WITH GFR   Lipid panel   Diabetic retinopathy ([place])    Make sure getting eye exam regularly.       Relevant Medications   liraglutide (VICTOZA) 18 MG/3ML SOPN   Other Relevant Orders   COMPLETE METABOLIC PANEL WITH GFR   Lipid panel   Controlled diabetes mellitus type 2 with complications ([place]) - Primary    Well controlled. Continue current regimen. Follow up in  6 months.    Lab Results  Component Value Date   HGBA1C 6.7 (A) 09/03/2019         Relevant Medications   liraglutide (VICTOZA) 18 MG/3ML SOPN   Other Relevant Orders   POCT glycosylated hemoglobin (Hb A1C) (Completed)   COMPLETE METABOLIC PANEL WITH GFR   Lipid panel    Other Visit Diagnoses    Screen for colon cancer       Relevant Orders   Cologuard   BMI 45.0-49.9, adult ([place])       Relevant Medications   liraglutide (VICTOZA) 18 MG/3ML SOPN  Also due to recheck borderline low sodium level from last set of labs.  BMI 47-just encouraged her to continue to work at [employer] and regular exercise I am excited that she will have a partnership with her mom to do this and I think it will hopefully make her very successful.  Colon cancer screening.    Meds ordered this encounter  Medications  . liraglutide (VICTOZA) 18 MG/3ML SOPN    Sig: Inject 0.4 mLs (2.4 mg total) into the skin daily.    Dispense:  9 mL    Refill:  3    No refills. Overdue for DM & F/U w/PCP.    Follow-up: Return in about 3 months (around 12/03/2019) for Diabetes follow-up.    Beatrice Lecher, MD

## 2019-09-03 NOTE — Assessment & Plan Note (Signed)
Well controlled. Continue current regimen. Follow up in  6 mo  

## 2019-09-03 NOTE — Assessment & Plan Note (Signed)
Make sure getting eye exam regularly.

## 2019-09-03 NOTE — Assessment & Plan Note (Signed)
Well controlled. Continue current regimen. Follow up in  6 months.    Lab Results  Component Value Date   HGBA1C 6.7 (A) 09/03/2019

## 2019-09-20 DIAGNOSIS — E1169 Type 2 diabetes mellitus with other specified complication: Secondary | ICD-10-CM | POA: Diagnosis not present

## 2019-09-20 DIAGNOSIS — I10 Essential (primary) hypertension: Secondary | ICD-10-CM | POA: Diagnosis not present

## 2019-09-20 DIAGNOSIS — E118 Type 2 diabetes mellitus with unspecified complications: Secondary | ICD-10-CM | POA: Diagnosis not present

## 2019-09-20 DIAGNOSIS — E785 Hyperlipidemia, unspecified: Secondary | ICD-10-CM | POA: Diagnosis not present

## 2019-09-20 LAB — COMPLETE METABOLIC PANEL WITH GFR
AG Ratio: 1.4 (calc) (ref 1.0–2.5)
ALT: 24 U/L (ref 6–29)
AST: 17 U/L (ref 10–35)
Albumin: 4.2 g/dL (ref 3.6–5.1)
Alkaline phosphatase (APISO): 76 U/L (ref 37–153)
BUN: 25 mg/dL (ref 7–25)
CO2: 27 mmol/L (ref 20–32)
Calcium: 9.5 mg/dL (ref 8.6–10.4)
Chloride: 99 mmol/L (ref 98–110)
Creat: 0.91 mg/dL (ref 0.50–1.05)
GFR, Est African American: 84 mL/min/{1.73_m2} (ref >=60)
GFR, Est Non African American: 73 mL/min/{1.73_m2} (ref >=60)
Globulin: 3 g/dL (calc) (ref 1.9–3.7)
Glucose, Bld: 125 mg/dL — ABNORMAL HIGH (ref 65–99)
Potassium: 4.5 mmol/L (ref 3.5–5.3)
Sodium: 135 mmol/L (ref 135–146)
Total Bilirubin: 0.4 mg/dL (ref 0.2–1.2)
Total Protein: 7.2 g/dL (ref 6.1–8.1)

## 2019-09-20 LAB — LIPID PANEL
Cholesterol: 141 mg/dL (ref <200)
HDL: 49 mg/dL — ABNORMAL LOW (ref >=50)
LDL Cholesterol (Calc): 76 mg/dL (calc)
Non-HDL Cholesterol (Calc): 92 mg/dL (calc) (ref <130)
Total CHOL/HDL Ratio: 2.9 (calc) (ref <5.0)
Triglycerides: 76 mg/dL (ref <150)

## 2019-10-02 ENCOUNTER — Other Ambulatory Visit: Payer: Self-pay | Admitting: Family Medicine

## 2019-10-02 DIAGNOSIS — J069 Acute upper respiratory infection, unspecified: Secondary | ICD-10-CM

## 2019-10-05 ENCOUNTER — Other Ambulatory Visit: Payer: Self-pay | Admitting: *Deleted

## 2019-10-05 ENCOUNTER — Encounter: Payer: Self-pay | Admitting: Family Medicine

## 2019-10-05 DIAGNOSIS — E118 Type 2 diabetes mellitus with unspecified complications: Secondary | ICD-10-CM

## 2019-10-05 MED ORDER — BLOOD GLUCOSE METER KIT: PACK | 0 refills | Status: AC

## 2019-10-22 ENCOUNTER — Other Ambulatory Visit: Payer: Self-pay | Admitting: Family Medicine

## 2019-10-29 ENCOUNTER — Encounter: Payer: Self-pay | Admitting: Family Medicine

## 2019-10-30 ENCOUNTER — Other Ambulatory Visit: Payer: Self-pay | Admitting: *Deleted

## 2019-10-30 MED ORDER — GLIPIZIDE ER 2.5 MG PO TB24: 2.5000 mg | ORAL_TABLET | Freq: Every day | ORAL | 3 refills | Status: DC

## 2019-11-17 ENCOUNTER — Other Ambulatory Visit: Payer: Self-pay | Admitting: Family Medicine

## 2019-11-20 NOTE — Assessment & Plan Note (Addendum)
Stage IB SCC of the vulva Negative symptom review, normal exam  Pap smear at next visit Return in 6 mths

## 2019-11-20 NOTE — Progress Notes (Signed)
Follow Up Note: Gyn-Onc  Laurie Hoffman 54 y.o. female  CC: She presents for a f/u visit  HPI:  Oncology History  Vulvar cancer ([place])  04/25/2018 Initial Diagnosis   Wide local excision of a left vulvar lesion  Final pathology showed a 2.2 cm lesion with 3 mm of invasion   05/11/2018 Definitive Surgery   left inguinal lymphadenectomy.  One lymph node was retrieved showing no evidence of metastatic disease.     Interval History: She denies any new lesions, itching, leg/groin pain, urinary symptoms, leg swelling, weight loss or cough.    Review of Systems  Review of Systems  Constitutional: Negative for weight loss.  Cardiovascular: Negative for leg swelling.  Gastrointestinal: Negative for abdominal pain.  Genitourinary: Negative for dysuria, frequency and urgency.   Current Meds:  Outpatient Encounter Medications as of 11/21/2019  Medication Sig  . ACCU-CHEK GUIDE test strip TEST UP 4 TIMES A DAY  . blood glucose meter kit and supplies Dispense based on patient and insurance preference. Use up to four times daily as directed. DX E11.8  . cetirizine (ZYRTEC) 10 MG tablet Take 10 mg by mouth daily.  . fluticasone (FLONASE) 50 MCG/ACT nasal spray Place 1 spray into both nostrils daily as needed for allergies or rhinitis.  Marland Kitchen glipiZIDE (GLUCOTROL XL) 2.5 MG 24 hr tablet Take 1 tablet (2.5 mg total) by mouth daily with breakfast.  . Insulin Pen Needle (NOVOTWIST) 32G X 5 MM MISC USE AS DIRECTED.  Dx Diabetes  . INVOKAMET [phone] MG TABS TAKE 1 TABLET BY MOUTH TWICE A DAY  . liraglutide (VICTOZA) 18 MG/3ML SOPN Inject 0.4 mLs (2.4 mg total) into the skin daily.  . montelukast (SINGULAIR) 10 MG tablet TAKE 1 TABLET BY MOUTH AT BEDTIME GENERIC EQUIVALENT FOR SINGULAIR  . Multiple Vitamins-Minerals (WOMENS MULTIVITAMIN) TABS Take 1 tablet by mouth daily.  Marland Kitchen nystatin (MYCOSTATIN/NYSTOP) powder APPLY TO AFFECTED  AREA 4 TIMES A DAY  . nystatin ointment (MYCOSTATIN) Apply 1 application topically 2 (two) times daily.  . simvastatin (ZOCOR) 20 MG tablet TAKE 1 TABLET(20 MG) BY MOUTH DAILY AT 6 PM  . telmisartan-hydrochlorothiazide (MICARDIS HCT) 40-12.5 MG tablet TAKE 1 TABLET BY MOUTH EVERY DAY  . XIIDRA 5 % SOLN Place 1 drop into both eyes 2 (two) times daily.   . [DISCONTINUED] ACCU-CHEK GUIDE test strip TEST UP 4 TIMES A DAY   No facility-administered encounter medications on file as of 11/21/2019.    Allergy:  Allergies  Allergen Reactions  . Atorvastatin Other (See Comments)    Leg weakness  . Latex Rash    Social Hx:   Social History   Socioeconomic History  . Marital status: Married    Spouse name: Not on file  . Number of children: Not on file  . Years of education: Not on file  . Highest education level: Not on file  Occupational History  . Not on file  Tobacco Use  . Smoking status: Former Smoker    Years: 2.00    Types: Cigarettes    Quit date: 05/17/1986    Years since quitting: 33.5  . Smokeless tobacco: Never Used  . Tobacco comment: 1 pack per week  Vaping Use  . Vaping Use: Never used  Substance and Sexual Activity  . Alcohol use: Yes    Comment: very rare  . Drug use: No  . Sexual activity: Not Currently    Birth control/protection: None  Other Topics Concern  . Not on file  Social  History Narrative  . Not on file   Social Determinants of Health   Financial Resource Strain:   . Difficulty of Paying Living Expenses:   Food Insecurity:   . Worried About Charity fundraiser in the Last Year:   . Arboriculturist in the Last Year:   Transportation Needs:   . Film/video editor (Medical):   Marland Kitchen Lack of Transportation (Non-Medical):   Physical Activity:   . Days of Exercise per Week:   . Minutes of Exercise per Session:   Stress:   . Feeling of Stress :   Social Connections:   . Frequency of Communication with Friends and Family:   . Frequency of Social  Gatherings with Friends and Family:   . Attends Religious Services:   . Active Member of Clubs or Organizations:   . Attends Archivist Meetings:   Marland Kitchen Marital Status:   Intimate Partner Violence:   . Fear of Current or Ex-Partner:   . Emotionally Abused:   Marland Kitchen Physically Abused:   . Sexually Abused:     Past Surgical Hx:  Past Surgical History:  Procedure Laterality Date  . LYMPH NODE DISSECTION Left 05/11/2018   Procedure: LEFT INGUINAL LYMPH NODE  DISSECTION;  Surgeon: [name], MD;  Location: [facility];  Service: Gynecology;  Laterality: Left;  . NO PAST SURGERIES    . VULVECTOMY N/A 04/25/2018   Procedure: WIDE EXCISION VULVECTOMY;  Surgeon: Marti Sleigh, MD;  Location: [facility];  Service: Gynecology;  Laterality: N/A;    Past Medical Hx:  Past Medical History:  Diagnosis Date  . Cellulitis 04/29/2018   Labia majora  . Dry eye syndrome   . Dyspnea   . Eczema   . Hypertension   . Macular degeneration   . Obesity   . Seasonal allergies   . Type 2 diabetes mellitus ([place])    followed by pcp  . VIN III (vulvar intraepithelial neoplasia III)   . Vulvar cancer ([place])    Stage IB  . Wears glasses     Family Hx:  Family History  Problem Relation Age of Onset  . Diabetes Mother        Insulin  . Heart attack Mother   . Cancer Father 5       melanoma  . Diabetes Sister        Insulin  . Hypertension Sister     Vitals:  BP 130/65 (BP Location: Left Arm, Patient Position: Sitting)   Pulse 88   Temp 98.7 F (37.1 C) (Temporal)   Resp 17   Ht 5' 4" (1.626 m)   Wt 258 lb (117 kg)   SpO2 100%   BMI 44.29 kg/m  Physical Exam:  Physical Exam Exam conducted with a chaperone present.  Constitutional:      Appearance: She is obese. She is not ill-appearing.  Abdominal:     General: There is no distension.     Palpations: There is no mass.     Tenderness: There is no abdominal tenderness.  Genitourinary:    Labia:         Right: No lesion.        Left: No lesion.      Rectum: Normal.  Lymphadenopathy:     Cervical:     Right cervical: No superficial cervical adenopathy.    Left cervical: No superficial cervical adenopathy.     Lower Body: No right inguinal adenopathy. No left inguinal  adenopathy.  Skin:    General: Skin is warm and dry.  Psychiatric:        Mood and Affect: Mood normal.     Assessment/Plan: Vulvar cancer ([facility]) Stage IB SCC of the vulva Negative symptom review, normal exam  Pap smear at next visit Return in 6 mths  I personally spent 25 minutes face-to-face and non-face-to-face in the care of this patient, which includes all pre, intra, and post visit time on the date of service.  Lahoma Crocker, MD 11/20/2019, 10:31 PM

## 2019-11-21 ENCOUNTER — Encounter: Payer: Self-pay | Admitting: Obstetrics & Gynecology

## 2019-11-21 ENCOUNTER — Inpatient Hospital Stay: Payer: Managed Care, Other (non HMO) | Attending: Obstetrics & Gynecology | Admitting: Obstetrics & Gynecology

## 2019-11-21 ENCOUNTER — Other Ambulatory Visit: Payer: Self-pay

## 2019-11-21 VITALS — BP 130/65 | HR 88 | Temp 98.7°F | Resp 17 | Ht 64.0 in | Wt 258.0 lb

## 2019-11-21 DIAGNOSIS — E669 Obesity, unspecified: Secondary | ICD-10-CM | POA: Insufficient documentation

## 2019-11-21 DIAGNOSIS — C519 Malignant neoplasm of vulva, unspecified: Secondary | ICD-10-CM | POA: Diagnosis not present

## 2019-11-21 DIAGNOSIS — Z794 Long term (current) use of insulin: Secondary | ICD-10-CM | POA: Insufficient documentation

## 2019-11-21 DIAGNOSIS — Z79899 Other long term (current) drug therapy: Secondary | ICD-10-CM | POA: Insufficient documentation

## 2019-11-21 DIAGNOSIS — Z87891 Personal history of nicotine dependence: Secondary | ICD-10-CM | POA: Insufficient documentation

## 2019-11-21 DIAGNOSIS — I10 Essential (primary) hypertension: Secondary | ICD-10-CM | POA: Insufficient documentation

## 2019-11-21 DIAGNOSIS — R591 Generalized enlarged lymph nodes: Secondary | ICD-10-CM | POA: Insufficient documentation

## 2019-11-21 DIAGNOSIS — E119 Type 2 diabetes mellitus without complications: Secondary | ICD-10-CM | POA: Diagnosis not present

## 2019-11-21 NOTE — Patient Instructions (Signed)
Return in 6 months

## 2019-11-24 IMAGING — DX DG CHEST 2V
2 series · 2 of 2 positions shown · non-contrast
Comparison: PA and lateral chest 08/16/2016.

CLINICAL DATA: History of vulvar cancer.  Former cigarette smoker.

EXAM:
CHEST - 2 VIEW

[chest pa]
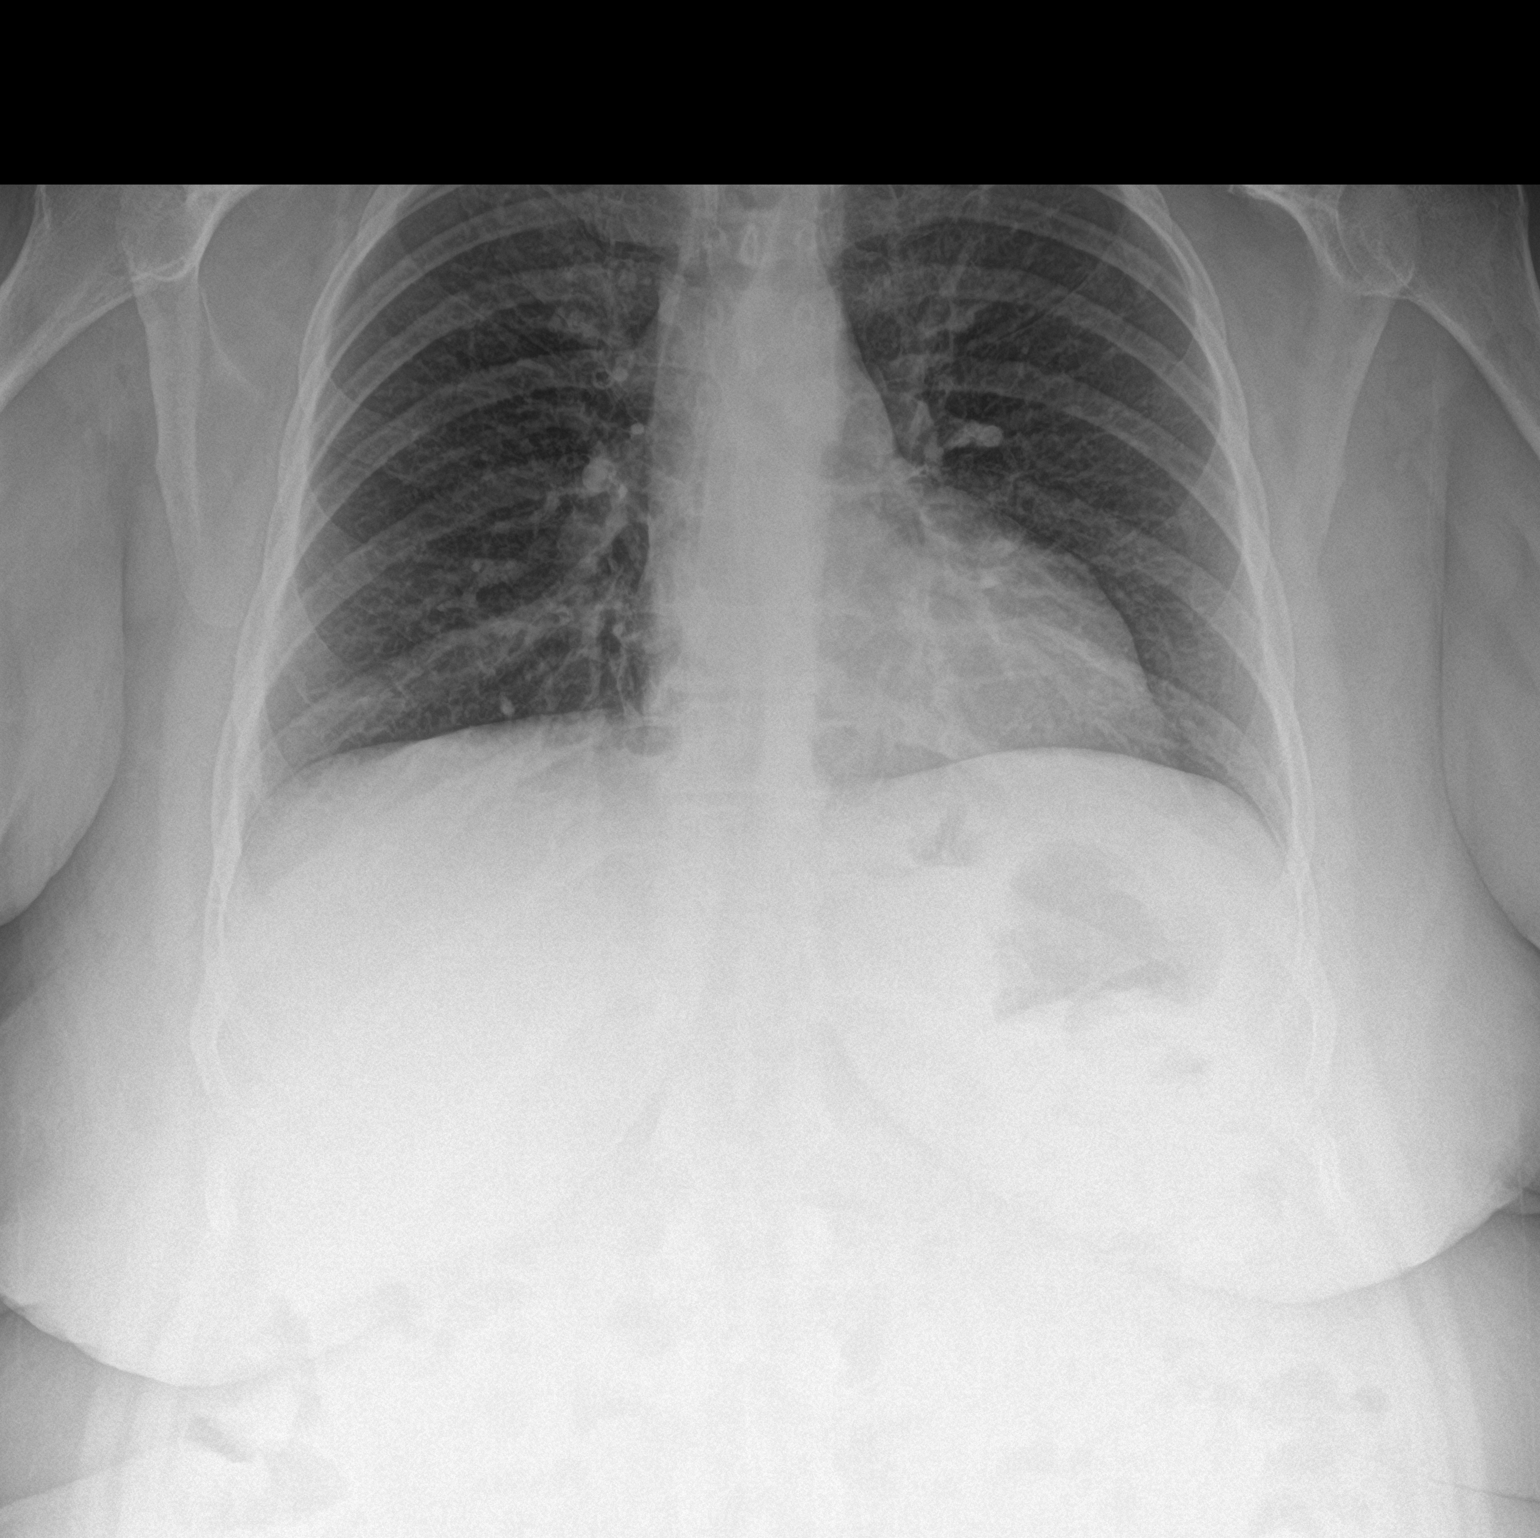

[chest lat]
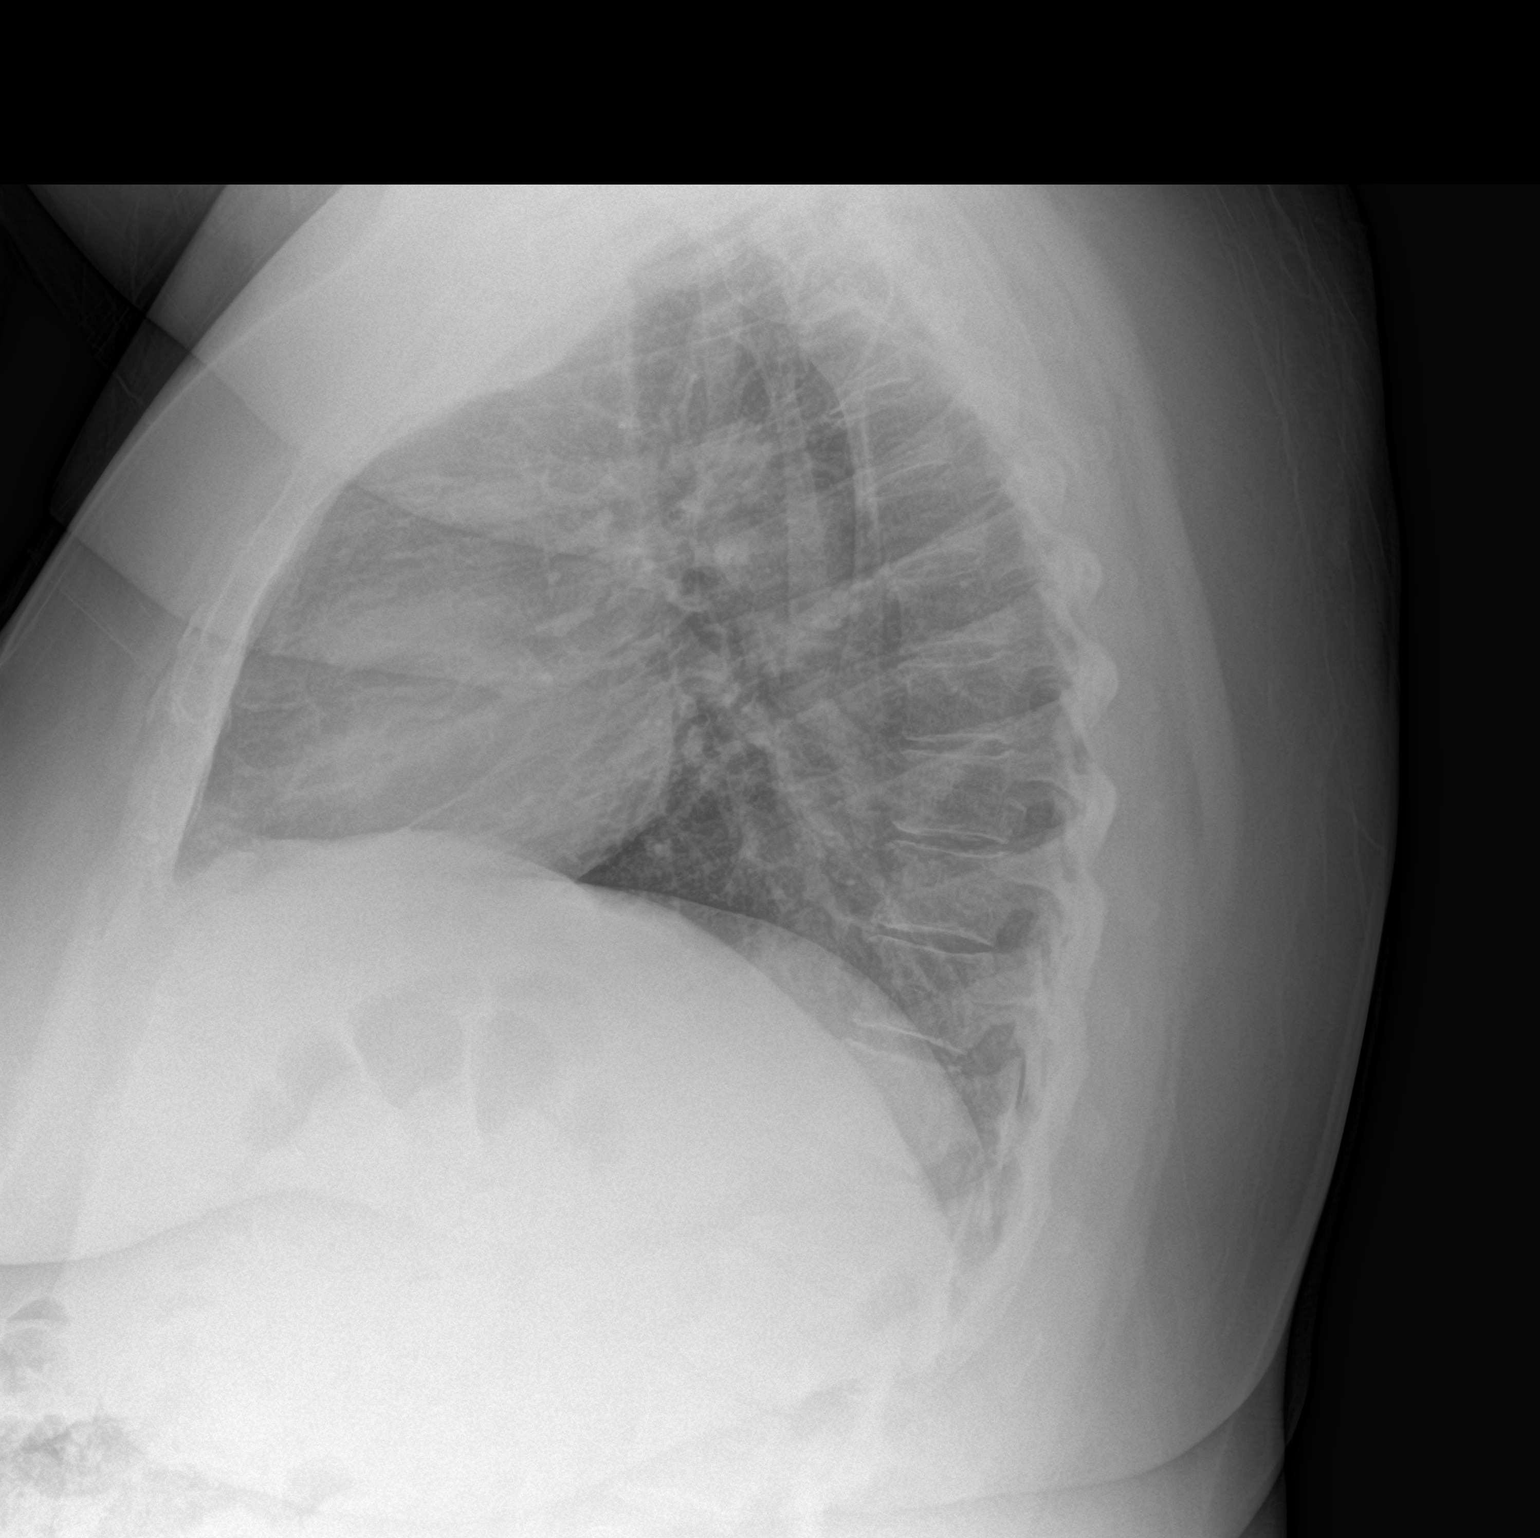

[2 of 2 positions shown; findings below may reference images not displayed]

FINDINGS: The lungs are clear. Heart size is normal. There is no pneumothorax
or pleural fluid. No acute or focal bony abnormality.
IMPRESSION: Normal chest.

## 2019-11-26 ENCOUNTER — Encounter: Payer: Self-pay | Admitting: Family Medicine

## 2019-11-27 ENCOUNTER — Other Ambulatory Visit: Payer: Self-pay | Admitting: *Deleted

## 2019-11-27 ENCOUNTER — Telehealth: Payer: Self-pay | Admitting: *Deleted

## 2019-11-27 DIAGNOSIS — E118 Type 2 diabetes mellitus with unspecified complications: Secondary | ICD-10-CM

## 2019-11-27 DIAGNOSIS — J3089 Other allergic rhinitis: Secondary | ICD-10-CM

## 2019-11-27 MED ORDER — MONTELUKAST SODIUM 10 MG PO TABS: ORAL_TABLET | ORAL | 3 refills | Status: DC

## 2019-11-27 NOTE — Telephone Encounter (Signed)
Pt's insurance requires a PA for Victoza and/or change to another covered medication on their preferred list from the following:  Bydureon,Byetta,Ozempic,Trulicity.   Unsure if a PA has already been sent for this or not. Pt had sent mychart requesting a refill but was told by the pharmacy that Victoza could not be filled due to needing a PA from her doctor.

## 2019-11-28 ENCOUNTER — Other Ambulatory Visit: Payer: Self-pay | Admitting: Family Medicine

## 2019-11-28 DIAGNOSIS — E118 Type 2 diabetes mellitus with unspecified complications: Secondary | ICD-10-CM

## 2019-11-28 MED ORDER — TRULICITY 0.75 MG/0.5ML ~~LOC~~ SOAJ
0.7500 mg | SUBCUTANEOUS | 3 refills | Status: DC
Start: 2019-11-28 — End: 2020-03-04

## 2019-11-28 NOTE — Telephone Encounter (Signed)
trulicity sent

## 2019-11-30 ENCOUNTER — Other Ambulatory Visit: Payer: Self-pay | Admitting: Family Medicine

## 2019-11-30 DIAGNOSIS — E118 Type 2 diabetes mellitus with unspecified complications: Secondary | ICD-10-CM

## 2019-12-03 ENCOUNTER — Encounter: Payer: Self-pay | Admitting: Family Medicine

## 2019-12-03 ENCOUNTER — Ambulatory Visit (INDEPENDENT_AMBULATORY_CARE_PROVIDER_SITE_OTHER): Payer: Managed Care, Other (non HMO) | Admitting: Family Medicine

## 2019-12-03 ENCOUNTER — Other Ambulatory Visit: Payer: Self-pay

## 2019-12-03 VITALS — BP 122/73 | HR 92 | Ht 64.0 in | Wt 257.0 lb

## 2019-12-03 DIAGNOSIS — I10 Essential (primary) hypertension: Secondary | ICD-10-CM | POA: Diagnosis not present

## 2019-12-03 DIAGNOSIS — E1169 Type 2 diabetes mellitus with other specified complication: Secondary | ICD-10-CM | POA: Diagnosis not present

## 2019-12-03 DIAGNOSIS — E785 Hyperlipidemia, unspecified: Secondary | ICD-10-CM

## 2019-12-03 DIAGNOSIS — E118 Type 2 diabetes mellitus with unspecified complications: Secondary | ICD-10-CM | POA: Diagnosis not present

## 2019-12-03 LAB — POCT GLYCOSYLATED HEMOGLOBIN (HGB A1C): Hemoglobin A1C: 6.8 % — AB (ref 4.0–5.6)

## 2019-12-03 NOTE — Progress Notes (Signed)
Established Patient Office Visit  Subjective:  Patient ID: Laurie Hoffman, female    DOB: 07-28-1965  Age: 54 y.o. MRN: [id number]  CC:  Chief Complaint  Patient presents with  . Diabetes  . Hypertension    HPI Laurie Hoffman presents for   Hypertension- Pt denies chest pain, SOB, dizziness, or heart palpitations.  Taking meds as directed w/o problems.  Denies medication side effects.    Diabetes - no hypoglycemic events. No wounds or sores that are not healing well. No increased thirst or urination. Checking glucose at home. Taking medications as prescribed without any side effects.  Has not switched to Trulicity yet.   Past Medical History:  Diagnosis Date  . Cellulitis 04/29/2018   Labia majora  . Dry eye syndrome   . Dyspnea   . Eczema   . Hypertension   . Macular degeneration   . Obesity   . Seasonal allergies   . Type 2 diabetes mellitus ([place])    followed by pcp  . VIN III (vulvar intraepithelial neoplasia III)   . Vulvar cancer ([place])    Stage IB  . Wears glasses     Past Surgical History:  Procedure Laterality Date  . LYMPH NODE DISSECTION Left 05/11/2018   Procedure: LEFT INGUINAL LYMPH NODE  DISSECTION;  Surgeon: [name], MD;  Location: [facility];  Service: Gynecology;  Laterality: Left;  . NO PAST SURGERIES    . VULVECTOMY N/A 04/25/2018   Procedure: WIDE EXCISION VULVECTOMY;  Surgeon: Marti Sleigh, MD;  [facility];  Service: Gynecology;  Laterality: N/A;    Family History  Problem Relation Age of Onset  . Diabetes Mother        Insulin  . Heart attack Mother   . Cancer Father 73       melanoma  . Diabetes Sister        Insulin  . Hypertension Sister     Social History   Socioeconomic History  . Marital status: Married    Spouse name: Not on file  . Number of children: Not on file  . Years of education: Not on file  . Highest education level: Not on file  Occupational History  . Not on file  Tobacco  Use  . Smoking status: Former Smoker    Years: 2.00    Types: Cigarettes    Quit date: 05/17/1986    Years since quitting: 33.5  . Smokeless tobacco: Never Used  . Tobacco comment: 1 pack per week  Vaping Use  . Vaping Use: Never used  Substance and Sexual Activity  . Alcohol use: Yes    Comment: very rare  . Drug use: No  . Sexual activity: Not Currently    Birth control/protection: None  Other Topics Concern  . Not on file  Social History Narrative  . Not on file   Social Determinants of Health   Financial Resource Strain:   . Difficulty of Paying Living Expenses:   Food Insecurity:   . Worried About Charity fundraiser in the Last Year:   . Arboriculturist in the Last Year:   Transportation Needs:   . Film/video editor (Medical):   Marland Kitchen Lack of Transportation (Non-Medical):   Physical Activity:   . Days of Exercise per Week:   . Minutes of Exercise per Session:   Stress:   . Feeling of Stress :   Social Connections:   . Frequency of Communication with Friends  and Family:   . Frequency of Social Gatherings with Friends and Family:   . Attends Religious Services:   . Active Member of Clubs or Organizations:   . Attends Archivist Meetings:   Marland Kitchen Marital Status:   Intimate Partner Violence:   . Fear of Current or Ex-Partner:   . Emotionally Abused:   Marland Kitchen Physically Abused:   . Sexually Abused:     Outpatient Medications Prior to Visit  Medication Sig Dispense Refill  . ACCU-CHEK GUIDE test strip TEST UP 4 TIMES A DAY 400 strip 3  . blood glucose meter kit and supplies Dispense based on patient and insurance preference. Use up to four times daily as directed. DX E11.8 1 each 0  . cetirizine (ZYRTEC) 10 MG tablet Take 10 mg by mouth daily.    . Dulaglutide (TRULICITY) 3.15 QM/0.8QP SOPN Inject 0.5 mLs (0.75 mg total) into the skin once a week. 4 pen 3  . fluticasone (FLONASE) 50 MCG/ACT nasal spray Place 1 spray into both nostrils daily as needed for  allergies or rhinitis.    Marland Kitchen glipiZIDE (GLUCOTROL XL) 2.5 MG 24 hr tablet Take 1 tablet (2.5 mg total) by mouth daily with breakfast. 90 tablet 3  . Insulin Pen Needle (NOVOTWIST) 32G X 5 MM MISC USE AS DIRECTED.  Dx Diabetes 100 each 11  . INVOKAMET [phone] MG TABS TAKE 1 TABLET BY MOUTH TWICE A DAY 180 tablet 1  . montelukast (SINGULAIR) 10 MG tablet TAKE 1 TABLET BY MOUTH AT BEDTIME GENERIC EQUIVALENT FOR SINGULAIR 90 tablet 3  . Multiple Vitamins-Minerals (WOMENS MULTIVITAMIN) TABS Take 1 tablet by mouth daily.    Marland Kitchen nystatin (MYCOSTATIN/NYSTOP) powder APPLY TO AFFECTED AREA 4 TIMES A DAY 60 g 2  . nystatin ointment (MYCOSTATIN) Apply 1 application topically 2 (two) times daily. 30 g 6  . simvastatin (ZOCOR) 20 MG tablet TAKE 1 TABLET(20 MG) BY MOUTH DAILY AT 6 PM 90 tablet 3  . telmisartan-hydrochlorothiazide (MICARDIS HCT) 40-12.5 MG tablet TAKE 1 TABLET BY MOUTH EVERY DAY 90 tablet 1  . XIIDRA 5 % SOLN Place 1 drop into both eyes 2 (two) times daily.   11  . liraglutide (VICTOZA) 18 MG/3ML SOPN Inject 0.4 mLs (2.4 mg total) into the skin daily. 9 mL 3   No facility-administered medications prior to visit.    Allergies  Allergen Reactions  . Atorvastatin Other (See Comments)    Leg weakness  . Latex Rash    ROS Review of Systems    Objective:    Physical Exam Constitutional:      Appearance: She is well-developed.  HENT:     Head: Normocephalic and atraumatic.  Cardiovascular:     Rate and Rhythm: Normal rate and regular rhythm.     Heart sounds: Normal heart sounds.  Pulmonary:     Effort: Pulmonary effort is normal.     Breath sounds: Normal breath sounds.  Skin:    General: Skin is warm and dry.  Neurological:     Mental Status: She is alert and oriented to person, place, and time.  Psychiatric:        Behavior: Behavior normal.     BP 122/73   Pulse 92   Ht 5' 4"  (1.626 m)   Wt 257 lb (116.6 kg)   SpO2 99%   BMI 44.11 kg/m  Wt Readings from Last 3  Encounters:  12/03/19 257 lb (116.6 kg)  11/21/19 258 lb (117 kg)  09/03/19 274 lb (  124.3 kg)     Health Maintenance Due  Topic Date Due  . Hepatitis C Screening  Never done  . HIV Screening  Never done  . COLONOSCOPY  Never done    There are no preventive care reminders to display for this patient.  Lab Results  Component Value Date   TSH 2.800 12/23/2016   Lab Results  Component Value Date   WBC 6.6 05/08/2018   HGB 15.0 05/08/2018   HCT 46.4 (H) 05/08/2018   MCV 92.6 05/08/2018   PLT 299 05/08/2018   Lab Results  Component Value Date   NA 135 09/20/2019   K 4.5 09/20/2019   CO2 27 09/20/2019   GLUCOSE 125 (H) 09/20/2019   BUN 25 09/20/2019   CREATININE 0.91 09/20/2019   BILITOT 0.4 09/20/2019   ALKPHOS 65 05/08/2018   AST 17 09/20/2019   ALT 24 09/20/2019   PROT 7.2 09/20/2019   ALBUMIN 4.2 05/08/2018   CALCIUM 9.5 09/20/2019   ANIONGAP 11 05/08/2018   Lab Results  Component Value Date   CHOL 141 09/20/2019   Lab Results  Component Value Date   HDL 49 (L) 09/20/2019   Lab Results  Component Value Date   LDLCALC 76 09/20/2019   Lab Results  Component Value Date   TRIG 76 09/20/2019   Lab Results  Component Value Date   CHOLHDL 2.9 09/20/2019   Lab Results  Component Value Date   HGBA1C 6.8 (A) 12/03/2019      Assessment & Plan:   Problem List Items Addressed This Visit      Cardiovascular and Mediastinum   ESSENTIAL HYPERTENSION, BENIGN - Primary    Well controlled. Continue current regimen. Follow up in          Endocrine   Hyperlipidemia associated with type 2 diabetes mellitus ([place])    Tenuous daily statin therapy.  Last lipid level was performed in May with an LDL of 76.      Controlled diabetes mellitus type 2 with complications ([facility])    A3F looks great today at 6.8.  Continue to work on healthy choices and diet as well as regular exercise and weight loss.  She is really made some great strides.  She says she has about  another week worth of the Victoza and then will be switching to Trulicity.  We did discuss that she will be starting at the lower dose so it may need to be increased or adjusted but we will see.  Plan to follow-up in 3 months.      Relevant Orders   POCT glycosylated hemoglobin (Hb A1C) (Completed)     Other   Severe obesity (BMI >= 40) ([facility])    She is continue to work hard at weight loss.  She has been trying to eat a keto dinner and feels like that is been helping she has been really trying to watch her portion control on her carbs.  In fact she is actually lost about 17 pounds since I last saw her in April which is phenomenal.  She has been trying to be active and doing some stretching at work.  Just encouraged her to continue to work on trying to increase her exercise and activity level.  She says ultimately she would like to get down to about 150 pounds.  We discussed that there would be significant benefit in this in fact she would likely be able to come off the medication.  No orders of the defined types were placed in this encounter.   Follow-up: Return in about 3 months (around 03/04/2020) for Diabetes follow-up.    Beatrice Lecher, MD

## 2019-12-03 NOTE — Assessment & Plan Note (Signed)
Tenuous daily statin therapy.  Last lipid level was performed in May with an LDL of 76.

## 2019-12-03 NOTE — Assessment & Plan Note (Signed)
A1c looks great today at 6.8.  Continue to work on healthy choices and diet as well as regular exercise and weight loss.  She is really made some great strides.  She says she has about another week worth of the Victoza and then will be switching to Trulicity.  We did discuss that she will be starting at the lower dose so it may need to be increased or adjusted but we will see.  Plan to follow-up in 3 months.

## 2019-12-03 NOTE — Assessment & Plan Note (Signed)
She is continue to work hard at weight loss.  She has been trying to eat a keto dinner and feels like that is been helping she has been really trying to watch her portion control on her carbs.  In fact she is actually lost about 17 pounds since I last saw her in April which is phenomenal.  She has been trying to be active and doing some stretching at work.  Just encouraged her to continue to work on trying to increase her exercise and activity level.  She says ultimately she would like to get down to about 150 pounds.  We discussed that there would be significant benefit in this in fact she would likely be able to come off the medication.

## 2019-12-03 NOTE — Assessment & Plan Note (Signed)
Well controlled. Continue current regimen. Follow up in   

## 2020-01-05 LAB — COLOGUARD

## 2020-01-11 LAB — COLOGUARD: COLOGUARD: NEGATIVE

## 2020-01-16 ENCOUNTER — Telehealth: Payer: Self-pay | Admitting: Family Medicine

## 2020-01-16 NOTE — Telephone Encounter (Signed)
Call patient: Cologuard test was negative this is great news.  Recommend repeat colon cancer screening in 3 years.

## 2020-01-16 NOTE — Telephone Encounter (Signed)
Task completed. Left a detailed vm msg for pt regarding negative Cologuard results. Aware of when next colon cancer screening is due. Direct call back info provided.

## 2020-02-27 ENCOUNTER — Other Ambulatory Visit: Payer: Self-pay | Admitting: Family Medicine

## 2020-03-04 ENCOUNTER — Ambulatory Visit (INDEPENDENT_AMBULATORY_CARE_PROVIDER_SITE_OTHER): Payer: Managed Care, Other (non HMO) | Admitting: Family Medicine

## 2020-03-04 ENCOUNTER — Other Ambulatory Visit: Payer: Self-pay | Admitting: Family Medicine

## 2020-03-04 ENCOUNTER — Encounter: Payer: Self-pay | Admitting: Family Medicine

## 2020-03-04 VITALS — BP 124/69 | HR 80 | Ht 64.0 in | Wt 268.0 lb

## 2020-03-04 DIAGNOSIS — E118 Type 2 diabetes mellitus with unspecified complications: Secondary | ICD-10-CM

## 2020-03-04 DIAGNOSIS — Z1159 Encounter for screening for other viral diseases: Secondary | ICD-10-CM

## 2020-03-04 DIAGNOSIS — E11319 Type 2 diabetes mellitus with unspecified diabetic retinopathy without macular edema: Secondary | ICD-10-CM | POA: Diagnosis not present

## 2020-03-04 DIAGNOSIS — I10 Essential (primary) hypertension: Secondary | ICD-10-CM

## 2020-03-04 DIAGNOSIS — Z1231 Encounter for screening mammogram for malignant neoplasm of breast: Secondary | ICD-10-CM

## 2020-03-04 DIAGNOSIS — B372 Candidiasis of skin and nail: Secondary | ICD-10-CM

## 2020-03-04 LAB — POCT GLYCOSYLATED HEMOGLOBIN (HGB A1C): Hemoglobin A1C: 7.2 % — AB (ref 4.0–5.6)

## 2020-03-04 MED ORDER — NYSTATIN 100000 UNIT/GM EX POWD: CUTANEOUS | 2 refills | Status: DC

## 2020-03-04 MED ORDER — TRULICITY 1.5 MG/0.5ML ~~LOC~~ SOAJ: 1.5000 mg | SUBCUTANEOUS | 1 refills | Status: DC

## 2020-03-04 NOTE — Assessment & Plan Note (Signed)
She says her goal is to get down to about 250 pounds by April.

## 2020-03-04 NOTE — Assessment & Plan Note (Addendum)
Unfortunately, her hemoglobin A1c was elevated to 7.2 today.  She admits she really has not been doing a great job with her diet she has been "cheating" a lot.  She is also gained about 8 or 9 pounds since I last saw her.  She says in fact a couple weeks ago she sat down with her husband and discussed really getting back on track with taking care of herself.  She says her goal is to get down to about 250 pounds by April.  We will increase Trulicity to 1.5 mg.  She has been tolerating it well thus far.

## 2020-03-04 NOTE — Progress Notes (Signed)
Established Patient Office Visit  Subjective:  Patient ID: Laurie Hoffman, female    DOB: 1965-12-26  Age: 54 y.o. MRN: [id number]  CC:  Chief Complaint  Patient presents with  . Diabetes    HPI Laurie Hoffman presents for   Hypertension- Pt denies chest pain, SOB, dizziness, or heart palpitations.  Taking meds as directed w/o problems.  Denies medication side effects.    Diabetes with retinopathy - no hypoglycemic events. No wounds or sores that are not healing well. No increased thirst or urination. Checking glucose at home. Taking medications as prescribed without any side effects. Now on a statin and doing well.  She reports that she has not been doing great with her diet she is been under a lot of stress helping take care of her mother who is now on dialysis 3 days a week she says even which on top of that between now and January she has 7 office visits that she has to schedule.  She would ike a refill on her nystatin powder for her groin and stomach area.    Past Medical History:  Diagnosis Date  . Cellulitis 04/29/2018   Labia majora  . Dry eye syndrome   . Dyspnea   . Eczema   . Hypertension   . Macular degeneration   . Obesity   . Seasonal allergies   . Type 2 diabetes mellitus ([place])    followed by pcp  . VIN III (vulvar intraepithelial neoplasia III)   . Vulvar cancer ([place])    Stage IB  . Wears glasses     Past Surgical History:  Procedure Laterality Date  . LYMPH NODE DISSECTION Left 05/11/2018   Procedure: LEFT INGUINAL LYMPH NODE  DISSECTION;  Surgeon: [name], MD;  Location: [facility];  Service: Gynecology;  Laterality: Left;  . NO PAST SURGERIES    . VULVECTOMY N/A 04/25/2018   Procedure: WIDE EXCISION VULVECTOMY;  Surgeon: Marti Sleigh, MD;  Location: [facility];  Service: Gynecology;  Laterality: N/A;    Family History  Problem Relation Age of Onset  . Diabetes Mother        Insulin  . Heart attack Mother   . Cancer  Father 80       melanoma  . Diabetes Sister        Insulin  . Hypertension Sister     Social History   Socioeconomic History  . Marital status: Married    Spouse name: Not on file  . Number of children: Not on file  . Years of education: Not on file  . Highest education level: Not on file  Occupational History  . Not on file  Tobacco Use  . Smoking status: Former Smoker    Years: 2.00    Types: Cigarettes    Quit date: 05/17/1986    Years since quitting: 33.8  . Smokeless tobacco: Never Used  . Tobacco comment: 1 pack per week  Vaping Use  . Vaping Use: Never used  Substance and Sexual Activity  . Alcohol use: Yes    Comment: very rare  . Drug use: No  . Sexual activity: Not Currently    Birth control/protection: None  Other Topics Concern  . Not on file  Social History Narrative  . Not on file   Social Determinants of Health   Financial Resource Strain:   . Difficulty of Paying Living Expenses: Not on file  Food Insecurity:   . Worried About [employer] of  Food in the Last Year: Not on file  . Ran Out of Food in the Last Year: Not on file  Transportation Needs:   . Lack of Transportation (Medical): Not on file  . Lack of Transportation (Non-Medical): Not on file  Physical Activity:   . Days of Exercise per Week: Not on file  . Minutes of Exercise per Session: Not on file  Stress:   . Feeling of Stress : Not on file  Social Connections:   . Frequency of Communication with Friends and Family: Not on file  . Frequency of Social Gatherings with Friends and Family: Not on file  . Attends Religious Services: Not on file  . Active Member of Clubs or Organizations: Not on file  . Attends Archivist Meetings: Not on file  . Marital Status: Not on file  Intimate Partner Violence:   . Fear of Current or Ex-Partner: Not on file  . Emotionally Abused: Not on file  . Physically Abused: Not on file  . Sexually Abused: Not on file    Outpatient Medications  Prior to Visit  Medication Sig Dispense Refill  . ACCU-CHEK GUIDE test strip TEST UP 4 TIMES A DAY 400 strip 3  . blood glucose meter kit and supplies Dispense based on patient and insurance preference. Use up to four times daily as directed. DX E11.8 1 each 0  . cetirizine (ZYRTEC) 10 MG tablet Take 10 mg by mouth daily.    . fluticasone (FLONASE) 50 MCG/ACT nasal spray Place 1 spray into both nostrils daily as needed for allergies or rhinitis.    Marland Kitchen glipiZIDE (GLUCOTROL XL) 2.5 MG 24 hr tablet Take 1 tablet (2.5 mg total) by mouth daily with breakfast. 90 tablet 3  . Insulin Pen Needle (NOVOTWIST) 32G X 5 MM MISC USE AS DIRECTED.  Dx Diabetes 100 each 11  . INVOKAMET [phone] MG TABS TAKE 1 TABLET BY MOUTH TWICE A DAY 180 tablet 1  . montelukast (SINGULAIR) 10 MG tablet TAKE 1 TABLET BY MOUTH AT BEDTIME GENERIC EQUIVALENT FOR SINGULAIR 90 tablet 3  . Multiple Vitamins-Minerals (WOMENS MULTIVITAMIN) TABS Take 1 tablet by mouth daily.    . simvastatin (ZOCOR) 20 MG tablet TAKE 1 TABLET(20 MG) BY MOUTH DAILY AT 6 PM 90 tablet 3  . telmisartan-hydrochlorothiazide (MICARDIS HCT) 40-12.5 MG tablet TAKE 1 TABLET BY MOUTH EVERY DAY 90 tablet 1  . XIIDRA 5 % SOLN Place 1 drop into both eyes 2 (two) times daily.   11  . Dulaglutide (TRULICITY) 9.32 TF/5.7DU SOPN Inject 0.5 mLs (0.75 mg total) into the skin once a week. 4 pen 3  . nystatin (MYCOSTATIN/NYSTOP) powder APPLY TO AFFECTED AREA 4 TIMES A DAY 60 g 2  . nystatin ointment (MYCOSTATIN) Apply 1 application topically 2 (two) times daily. 30 g 6   No facility-administered medications prior to visit.    Allergies  Allergen Reactions  . Atorvastatin Other (See Comments)    Leg weakness  . Latex Rash    ROS Review of Systems    Objective:    Physical Exam Constitutional:      Appearance: She is well-developed.  HENT:     Head: Normocephalic and atraumatic.  Cardiovascular:     Rate and Rhythm: Normal rate and regular rhythm.     Heart  sounds: Normal heart sounds.  Pulmonary:     Effort: Pulmonary effort is normal.     Breath sounds: Normal breath sounds.  Skin:    General: Skin  is warm and dry.  Neurological:     Mental Status: She is alert and oriented to person, place, and time.  Psychiatric:        Behavior: Behavior normal.     BP 124/69   Pulse 80   Ht 5' 4"  (1.626 m)   Wt 268 lb (121.6 kg)   SpO2 100%   BMI 46.00 kg/m  Wt Readings from Last 3 Encounters:  03/04/20 268 lb (121.6 kg)  12/03/19 257 lb (116.6 kg)  11/21/19 258 lb (117 kg)     Health Maintenance Due  Topic Date Due  . Hepatitis C Screening  Never done    There are no preventive care reminders to display for this patient.  Lab Results  Component Value Date   TSH 2.800 12/23/2016   Lab Results  Component Value Date   WBC 6.6 05/08/2018   HGB 15.0 05/08/2018   HCT 46.4 (H) 05/08/2018   MCV 92.6 05/08/2018   PLT 299 05/08/2018   Lab Results  Component Value Date   NA 135 09/20/2019   K 4.5 09/20/2019   CO2 27 09/20/2019   GLUCOSE 125 (H) 09/20/2019   BUN 25 09/20/2019   CREATININE 0.91 09/20/2019   BILITOT 0.4 09/20/2019   ALKPHOS 65 05/08/2018   AST 17 09/20/2019   ALT 24 09/20/2019   PROT 7.2 09/20/2019   ALBUMIN 4.2 05/08/2018   CALCIUM 9.5 09/20/2019   ANIONGAP 11 05/08/2018   Lab Results  Component Value Date   CHOL 141 09/20/2019   Lab Results  Component Value Date   HDL 49 (L) 09/20/2019   Lab Results  Component Value Date   LDLCALC 76 09/20/2019   Lab Results  Component Value Date   TRIG 76 09/20/2019   Lab Results  Component Value Date   CHOLHDL 2.9 09/20/2019   Lab Results  Component Value Date   HGBA1C 7.2 (A) 03/04/2020      Assessment & Plan:   Problem List Items Addressed This Visit      Cardiovascular and Mediastinum   ESSENTIAL HYPERTENSION, BENIGN - Primary    Well controlled. Continue current regimen. Follow up in  3 mo      Relevant Orders   BASIC METABOLIC PANEL  WITH GFR     Endocrine   Diabetic retinopathy ([place])   Relevant Medications   Dulaglutide (TRULICITY) 1.5 FG/1.8EX SOPN   Controlled diabetes mellitus type 2 with complications ([place])    Unfortunately, her hemoglobin A1c was elevated to 7.2 today.  She admits she really has not been doing a great job with her diet she has been "cheating" a lot.  She is also gained about 8 or 9 pounds since I last saw her.  She says in fact a couple weeks ago she sat down with her husband and discussed really getting back on track with taking care of herself.  She says her goal is to get down to about 250 pounds by April.  We will increase Trulicity to 1.5 mg.  She has been tolerating it well thus far.      Relevant Medications   Dulaglutide (TRULICITY) 1.5 HB/7.1IR SOPN   Other Relevant Orders   BASIC METABOLIC PANEL WITH GFR   POCT glycosylated hemoglobin (Hb A1C) (Completed)     Other   Severe obesity (BMI >= 40) ([place])    She says her goal is to get down to about 250 pounds by April.       Relevant Medications  Dulaglutide (TRULICITY) 1.5 TM/2.2QJ SOPN    Other Visit Diagnoses    Encounter for hepatitis C screening test for low risk patient       Relevant Orders   Hepatitis C Antibody   Skin yeast infection       Relevant Medications   nystatin (MYCOSTATIN/NYSTOP) powder      Meds ordered this encounter  Medications  . nystatin (MYCOSTATIN/NYSTOP) powder    Sig: APPLY TO AFFECTED AREA 4 TIMES A DAY    Dispense:  60 g    Refill:  2  . Dulaglutide (TRULICITY) 1.5 FH/5.4TG SOPN    Sig: Inject 1.5 mg into the skin once a week.    Dispense:  6 mL    Refill:  1    Follow-up: Return in about 3 months (around 06/04/2020) for Diabetes follow-up.    Beatrice Lecher, MD

## 2020-03-04 NOTE — Assessment & Plan Note (Signed)
Well controlled. Continue current regimen. Follow up in  3 mo .  

## 2020-03-05 LAB — BASIC METABOLIC PANEL WITH GFR
BUN: 22 mg/dL (ref 7–25)
CO2: 29 mmol/L (ref 20–32)
Calcium: 9.9 mg/dL (ref 8.6–10.4)
Chloride: 100 mmol/L (ref 98–110)
Creat: 0.82 mg/dL (ref 0.50–1.05)
GFR, Est African American: 95 mL/min/{1.73_m2} (ref >=60)
GFR, Est Non African American: 82 mL/min/{1.73_m2} (ref >=60)
Glucose, Bld: 137 mg/dL (ref 65–139)
Potassium: 4.4 mmol/L (ref 3.5–5.3)
Sodium: 139 mmol/L (ref 135–146)

## 2020-03-05 LAB — HEPATITIS C ANTIBODY
Hepatitis C Ab: NONREACTIVE
SIGNAL TO CUT-OFF: 0.01 (ref <1.00)

## 2020-03-05 NOTE — Progress Notes (Signed)
All labs are normal. 

## 2020-03-13 ENCOUNTER — Ambulatory Visit: Payer: Managed Care, Other (non HMO)

## 2020-03-13 ENCOUNTER — Other Ambulatory Visit: Payer: Self-pay

## 2020-03-15 ENCOUNTER — Other Ambulatory Visit: Payer: Self-pay | Admitting: Family Medicine

## 2020-04-30 ENCOUNTER — Ambulatory Visit (INDEPENDENT_AMBULATORY_CARE_PROVIDER_SITE_OTHER): Payer: Managed Care, Other (non HMO)

## 2020-04-30 ENCOUNTER — Other Ambulatory Visit: Payer: Self-pay

## 2020-04-30 DIAGNOSIS — Z1231 Encounter for screening mammogram for malignant neoplasm of breast: Secondary | ICD-10-CM

## 2020-05-31 ENCOUNTER — Other Ambulatory Visit: Payer: Self-pay | Admitting: Family Medicine

## 2020-05-31 DIAGNOSIS — E118 Type 2 diabetes mellitus with unspecified complications: Secondary | ICD-10-CM

## 2020-06-04 ENCOUNTER — Ambulatory Visit: Payer: Managed Care, Other (non HMO) | Admitting: Family Medicine

## 2020-08-03 ENCOUNTER — Encounter: Payer: Self-pay | Admitting: Family Medicine

## 2020-08-05 ENCOUNTER — Other Ambulatory Visit: Payer: Self-pay

## 2020-08-05 ENCOUNTER — Telehealth (INDEPENDENT_AMBULATORY_CARE_PROVIDER_SITE_OTHER): Payer: Managed Care, Other (non HMO) | Admitting: Family Medicine

## 2020-08-05 ENCOUNTER — Encounter: Payer: Self-pay | Admitting: Family Medicine

## 2020-08-05 VITALS — BP 117/74 | Temp 97.5°F

## 2020-08-05 DIAGNOSIS — J301 Allergic rhinitis due to pollen: Secondary | ICD-10-CM | POA: Diagnosis not present

## 2020-08-05 MED ORDER — PREDNISONE 10 MG PO TABS: ORAL_TABLET | ORAL | 0 refills | Status: AC

## 2020-08-05 NOTE — Progress Notes (Signed)
Pt reports that her sxs began late last week. She stated that she gets this way around this time of year.   No sick contacts,hasn't taken a Covid test. Denies f/s/c/n/v/d/ headaches,or body aches.   No color to mucus. She has been taking sudafed allergy and sinus which helps her during the daytime.

## 2020-08-05 NOTE — Progress Notes (Addendum)
Virtual Visit via Video Note  I connected with Laurie Hoffman on 08/05/20 at  4:20 PM EDT by a video enabled telemedicine application and verified that I am speaking with the correct person using two identifiers.   I discussed the limitations of evaluation and management by telemedicine and the availability of in person appointments. The patient expressed understanding and agreed to proceed.  Patient location: home Provider location: in office  Subjective:    CC: sinus congestion, itchy ears.   HPI:  Sinus congestion, pressure and itchy ears x 1 week.  + sneezing and coughing.  Throat is mildly irritated.    Pt reports that her sxs began late last week. She stated that she gets this way around this time of year.   No sick contacts,hasn't taken a Covid test. Denies f/s/c/n/v/d/ headaches,or body aches.   No color to mucus. She has been taking sudafed allergy and sinus which helps her during the daytime, taking zyrtec and singulair at night.    Past medical history, Surgical history, Family history not pertinant except as noted below, Social history, Allergies, and medications have been entered into the medical record, reviewed, and corrections made.   Review of Systems: No fevers, chills, night sweats, weight loss, chest pain, or shortness of breath.   Objective:    General: Speaking clearly in complete sentences without any shortness of breath.  Alert and oriented x3.  Normal judgment. No apparent acute distress.    Impression and Recommendations:    No problem-specific Assessment & Plan notes found for this encounter.  Allergic rhinitis, severe - maxed on usually allergy medication.  Will add a prednisone taper. Avoid being outside. Avoid mowing, etc.  Discussed possible referral to an allergist before next year.      Time spent in encounter 15 minutes  I discussed the assessment and treatment plan with the patient. The patient was provided an opportunity to ask questions  and all were answered. The patient agreed with the plan and demonstrated an understanding of the instructions.   The patient was advised to call back or seek an in-person evaluation if the symptoms worsen or if the condition fails to improve as anticipated.   Beatrice Lecher, MD

## 2020-08-11 IMAGING — MG MM DIGITAL SCREENING BILAT W/ TOMO W/ CAD
6 of 12 series · 6 of 36 positions shown · non-contrast
Comparison: Previous exam(s).

ACR Breast Density Category a: The breast tissue is almost entirely
fatty.

CLINICAL DATA: Screening.

EXAM:
DIGITAL SCREENING BILATERAL MAMMOGRAM WITH TOMO AND CAD

[R XCCL synth-2D]
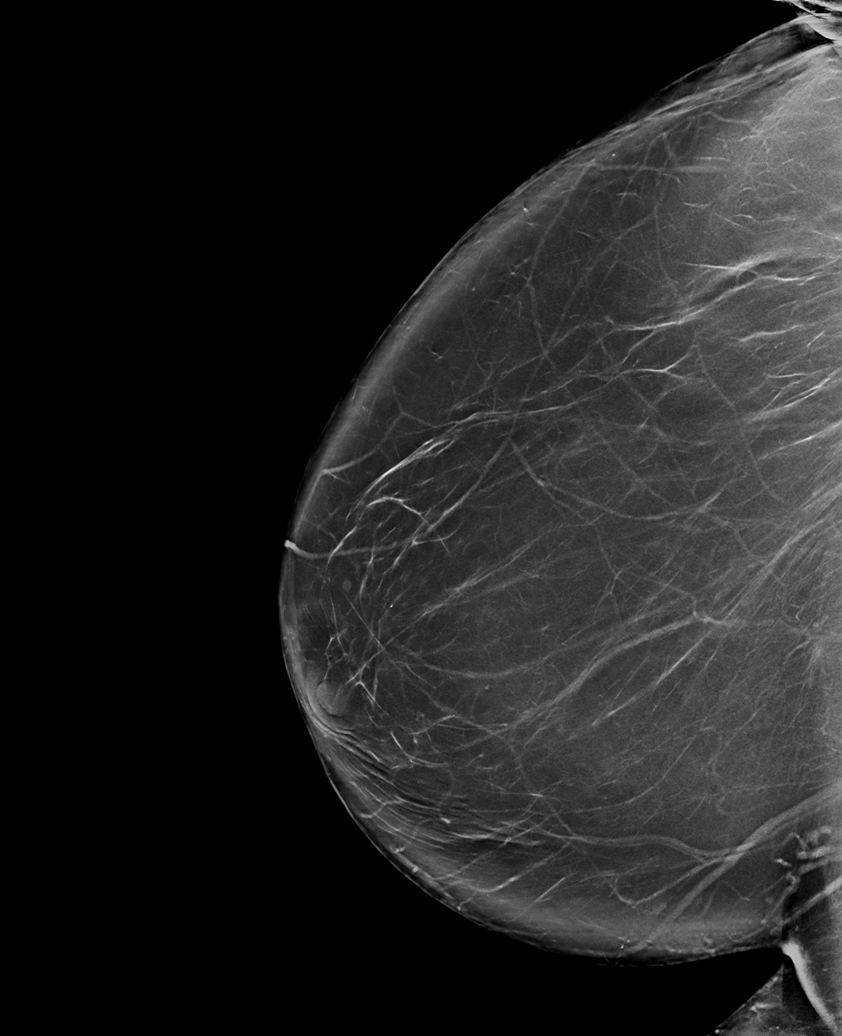

[L MLO synth-2D]
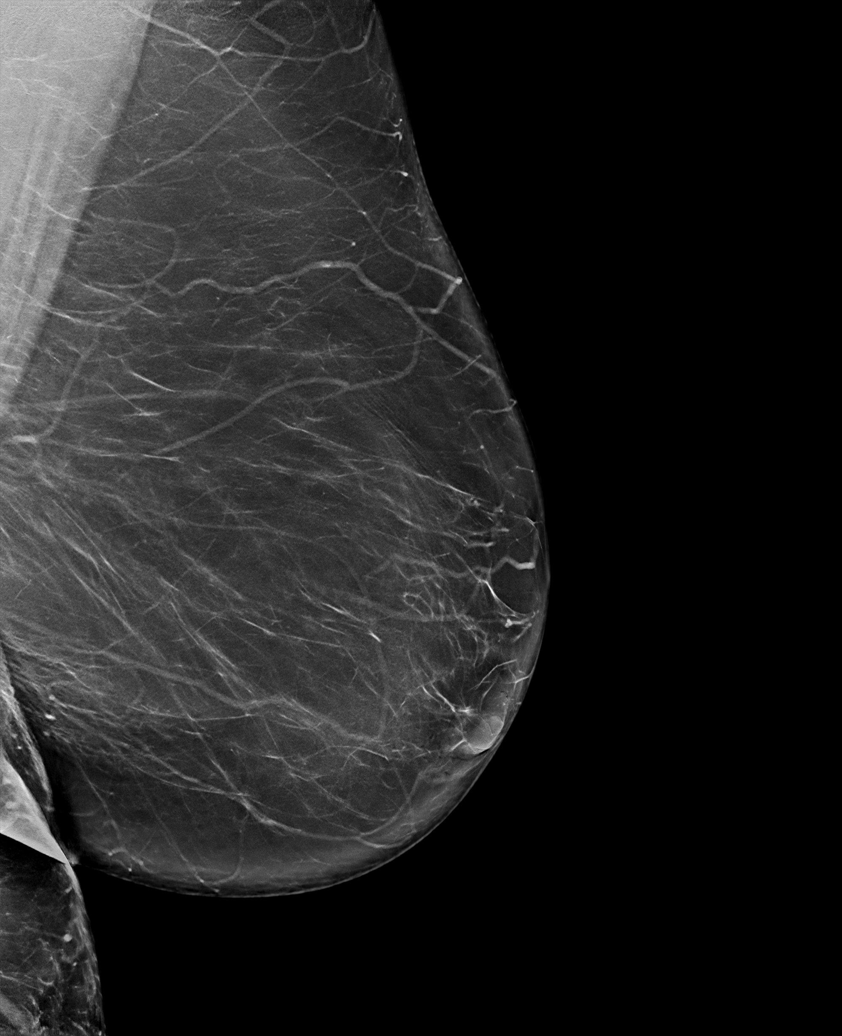

[R MLO synth-2D]
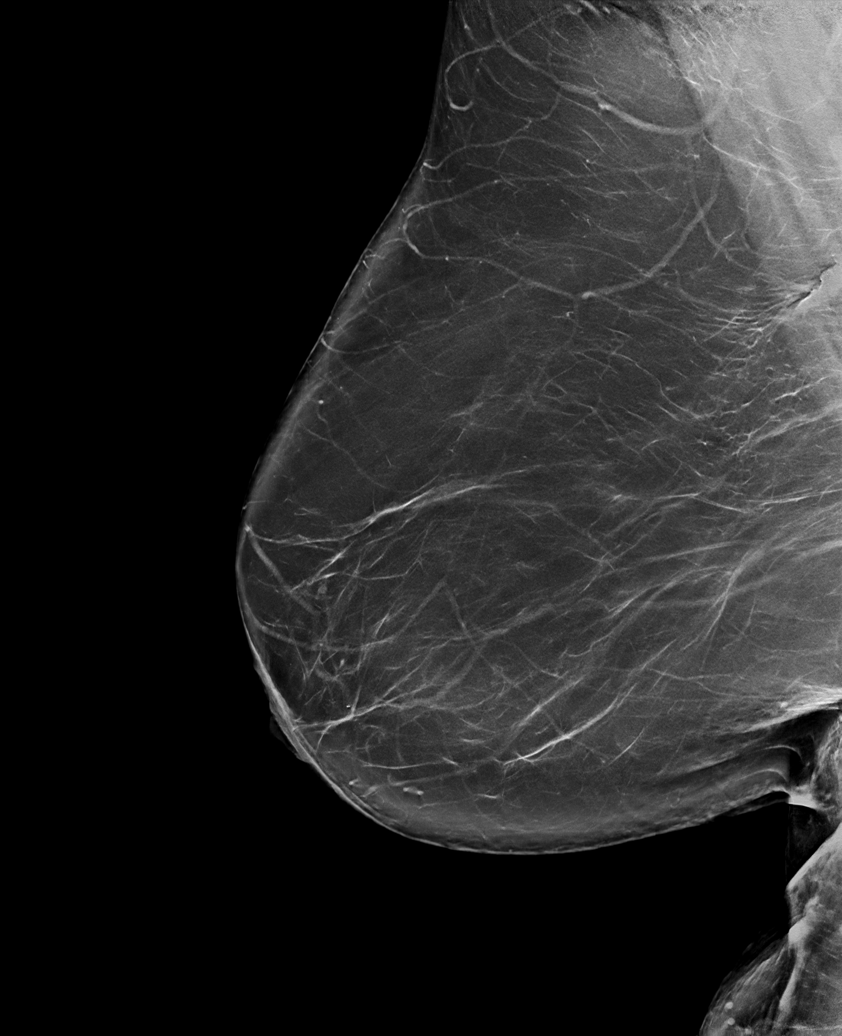

[R CC synth-2D]
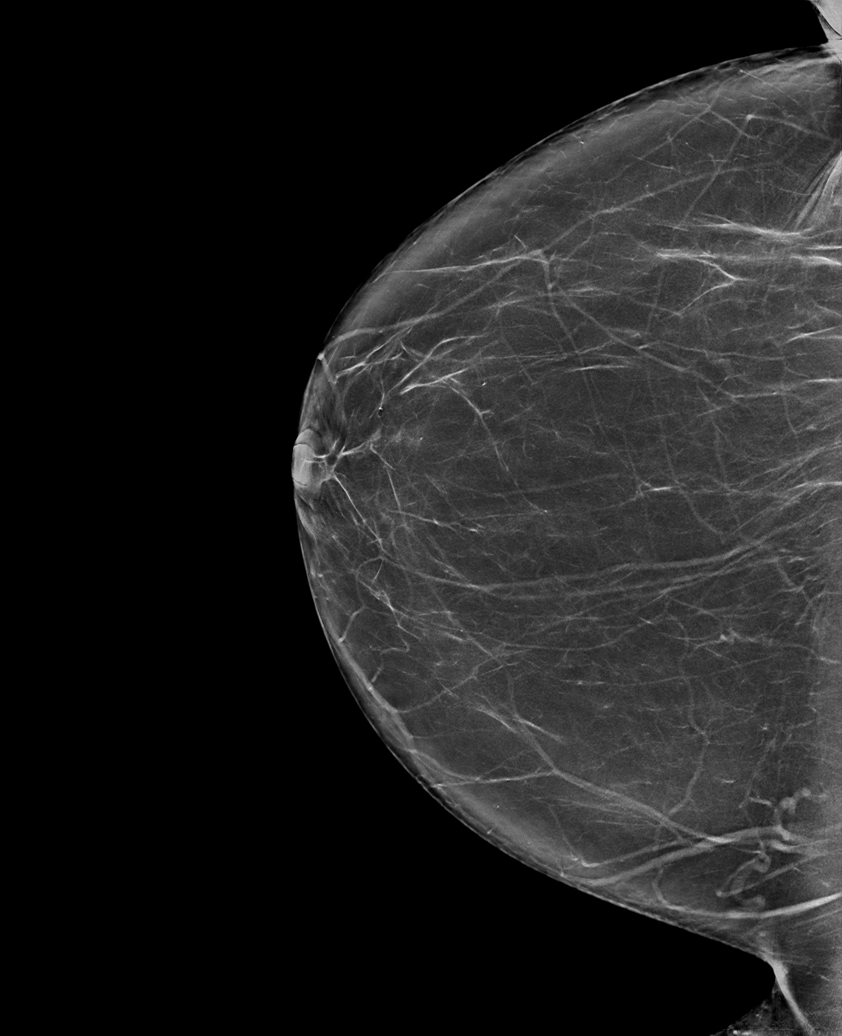

[L XCCL synth-2D]
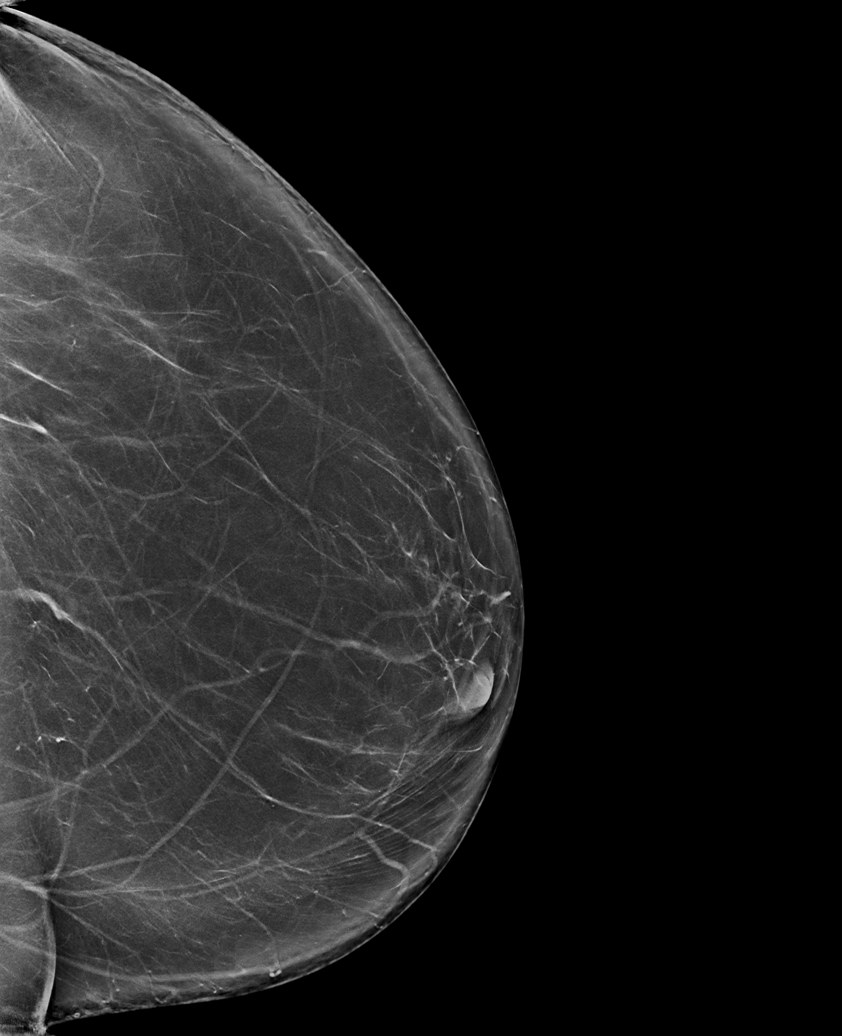

[L CC synth-2D]
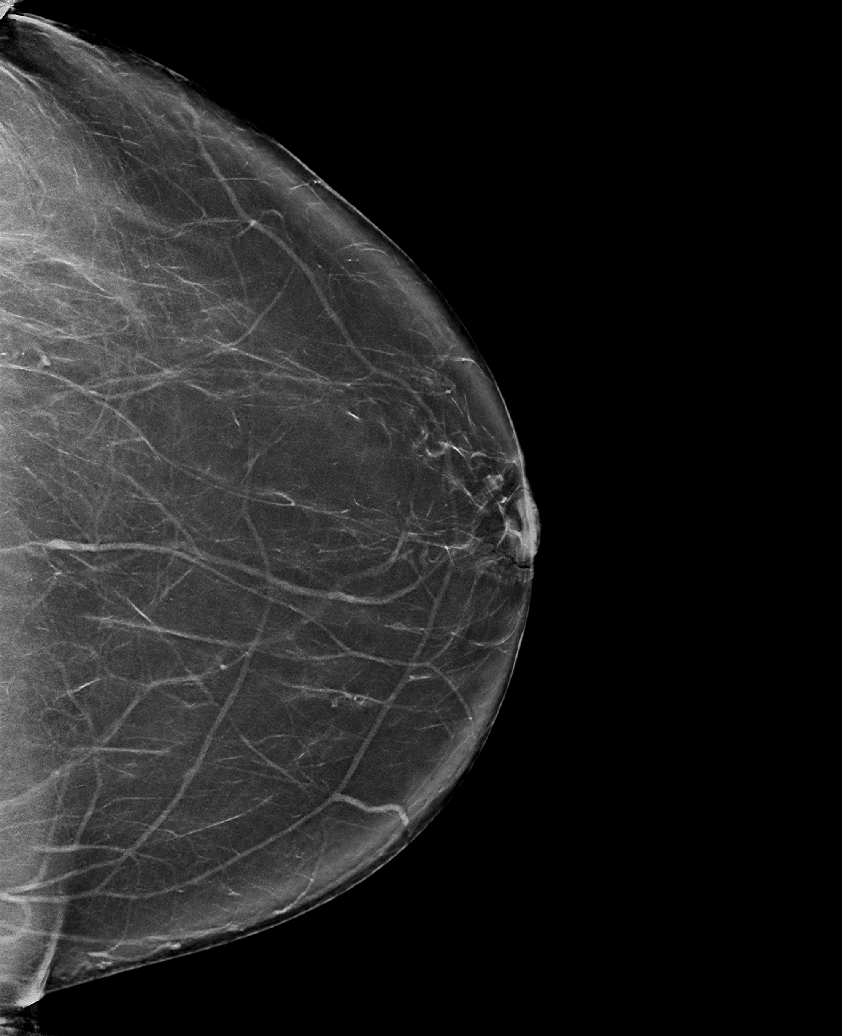

[6 of 36 positions shown; findings below may reference images not displayed]

FINDINGS: There are no findings suspicious for malignancy. Images were
processed with CAD.
IMPRESSION: No mammographic evidence of malignancy. A result letter of this
screening mammogram will be mailed directly to the patient.

RECOMMENDATION:
Screening mammogram in one year. (Code:[id number])

BI-RADS CATEGORY  1: Negative.

## 2020-08-20 ENCOUNTER — Other Ambulatory Visit: Payer: Self-pay | Admitting: Family Medicine

## 2020-09-04 ENCOUNTER — Other Ambulatory Visit: Payer: Self-pay | Admitting: Family Medicine

## 2020-09-04 DIAGNOSIS — E118 Type 2 diabetes mellitus with unspecified complications: Secondary | ICD-10-CM

## 2020-10-16 LAB — HM DIABETES EYE EXAM

## 2020-10-17 ENCOUNTER — Other Ambulatory Visit: Payer: Self-pay

## 2020-10-17 ENCOUNTER — Encounter: Payer: Self-pay | Admitting: Family Medicine

## 2020-10-17 ENCOUNTER — Ambulatory Visit: Payer: Managed Care, Other (non HMO) | Admitting: Family Medicine

## 2020-10-17 VITALS — BP 131/68 | HR 87 | Ht 64.0 in | Wt 272.0 lb

## 2020-10-17 DIAGNOSIS — I10 Essential (primary) hypertension: Secondary | ICD-10-CM

## 2020-10-17 DIAGNOSIS — E11319 Type 2 diabetes mellitus with unspecified diabetic retinopathy without macular edema: Secondary | ICD-10-CM | POA: Diagnosis not present

## 2020-10-17 DIAGNOSIS — E118 Type 2 diabetes mellitus with unspecified complications: Secondary | ICD-10-CM | POA: Diagnosis not present

## 2020-10-17 LAB — POCT GLYCOSYLATED HEMOGLOBIN (HGB A1C): Hemoglobin A1C: 8.3 % — AB (ref 4.0–5.6)

## 2020-10-17 MED ORDER — GLIPIZIDE ER 2.5 MG PO TB24
2.5000 mg | ORAL_TABLET | Freq: Every day | ORAL | 3 refills | Status: DC
Start: 2020-10-17 — End: 2021-01-23

## 2020-10-17 MED ORDER — TRULICITY 3 MG/0.5ML ~~LOC~~ SOAJ: 3.0000 mg | SUBCUTANEOUS | 3 refills | Status: DC

## 2020-10-17 NOTE — Assessment & Plan Note (Signed)
We discussed working on getting her A1c down.  I will get a go ahead and bump up her Trulicity for now.  Hopefully the insurance will cover.  In the meantime she really feels committed to making some changes in getting back on track.  Encouraged her to just make a small change each week.  And then at some point start doing some walking for exercise shooting for at least 10 minutes twice a week.  I like to see her back in 90 days so that we can make sure were getting that A1c down with a goal of less than 7.

## 2020-10-17 NOTE — Assessment & Plan Note (Signed)
Blood pressure looks okay today little bit borderline we will definitely keep an eye on this.  Would like to see her lose some weight to and that would directly result in lowering of her blood pressure.  She is due for labs today so we will check on that as well.

## 2020-10-17 NOTE — Assessment & Plan Note (Signed)
BMI 46.  Discussed continuing to work on healthy diet and really getting on track with making some changes and eating low-carb and low sugar.

## 2020-10-17 NOTE — Assessment & Plan Note (Signed)
Eye exam is up-to-date she will be due again in August around the time that I see her back.

## 2020-10-17 NOTE — Progress Notes (Signed)
Established Patient Office Visit  Subjective:  Patient ID: Laurie Hoffman, female    DOB: 15-Jun-1965  Age: 55 y.o. MRN: [id number]  CC:  Chief Complaint  Patient presents with  . Diabetes    HPI Laurie Hoffman presents for follow-up.  She has not been in our office in over 6 months for her diabetes follow-up.  Hypertension- Pt denies chest pain, SOB, dizziness, or heart palpitations.  Taking meds as directed w/o problems.  Denies medication side effects.    Diabetes - no hypoglycemic events. No wounds or sores that are not healing well. No increased thirst or urination. Checking glucose at home. Taking medications as prescribed without any side effects.  She says her mom actually passed away in 2022-06-21 and she just really has not been taking great care of herself and she has not been eating the best diet but she really wants to go ahead and get back on track.   Past Medical History:  Diagnosis Date  . Cellulitis 04/29/2018   Labia majora  . Dry eye syndrome   . Dyspnea   . Eczema   . Hypertension   . Macular degeneration   . Obesity   . Seasonal allergies   . Type 2 diabetes mellitus ([place])    followed by pcp  . VIN III (vulvar intraepithelial neoplasia III)   . Vulvar cancer ([place])    Stage IB  . Wears glasses     Past Surgical History:  Procedure Laterality Date  . LYMPH NODE DISSECTION Left 05/11/2018   Procedure: LEFT INGUINAL LYMPH NODE  DISSECTION;  Surgeon: [name], MD;  Location: [facility];  Service: Gynecology;  Laterality: Left;  . NO PAST SURGERIES    . VULVECTOMY N/A 04/25/2018   Procedure: WIDE EXCISION VULVECTOMY;  Surgeon: Marti Sleigh, MD;  Location: [facility];  Service: Gynecology;  Laterality: N/A;    Family History  Problem Relation Age of Onset  . Diabetes Mother        Insulin  . Heart attack Mother   . Cancer Father 53       melanoma  . Diabetes Sister        Insulin  . Hypertension Sister     Social History    Socioeconomic History  . Marital status: Married    Spouse name: Not on file  . Number of children: Not on file  . Years of education: Not on file  . Highest education level: Not on file  Occupational History  . Not on file  Tobacco Use  . Smoking status: Former Smoker    Years: 2.00    Types: Cigarettes    Quit date: 05/17/1986    Years since quitting: 34.4  . Smokeless tobacco: Never Used  . Tobacco comment: 1 pack per week  Vaping Use  . Vaping Use: Never used  Substance and Sexual Activity  . Alcohol use: Yes    Comment: very rare  . Drug use: No  . Sexual activity: Not Currently    Birth control/protection: None  Other Topics Concern  . Not on file  Social History Narrative  . Not on file   Social Determinants of Health   Financial Resource Strain: Not on file  Food Insecurity: Not on file  Transportation Needs: Not on file  Physical Activity: Not on file  Stress: Not on file  Social Connections: Not on file  Intimate Partner Violence: Not on file    Outpatient Medications Prior to Visit  Medication Sig Dispense Refill  . ACCU-CHEK GUIDE test strip TEST UP 4 TIMES A DAY 400 strip 3  . blood glucose meter kit and supplies Dispense based on patient and insurance preference. Use up to four times daily as directed. DX E11.8 1 each 0  . cetirizine (ZYRTEC) 10 MG tablet Take 10 mg by mouth daily.    . fluticasone (FLONASE) 50 MCG/ACT nasal spray Place 1 spray into both nostrils daily as needed for allergies or rhinitis.    . INVOKAMET [id number] MG TABS TAKE 1 TABLET BY MOUTH TWICE A DAY 180 tablet 1  . montelukast (SINGULAIR) 10 MG tablet TAKE 1 TABLET BY MOUTH AT BEDTIME GENERIC EQUIVALENT FOR SINGULAIR 90 tablet 3  . Multiple Vitamins-Minerals (WOMENS MULTIVITAMIN) TABS Take 1 tablet by mouth daily.    Marland Kitchen nystatin (MYCOSTATIN/NYSTOP) powder APPLY TO AFFECTED AREA 4 TIMES A DAY 60 g 2  . simvastatin (ZOCOR) 20 MG tablet TAKE 1 TABLET(20 MG) BY MOUTH DAILY AT 6 PM 90  tablet 3  . telmisartan-hydrochlorothiazide (MICARDIS HCT) 40-12.5 MG tablet TAKE 1 TABLET BY MOUTH EVERY DAY 90 tablet 1  . XIIDRA 5 % SOLN Place 1 drop into both eyes 2 (two) times daily.   11  . glipiZIDE (GLUCOTROL XL) 2.5 MG 24 hr tablet Take 1 tablet (2.5 mg total) by mouth daily with breakfast. 90 tablet 3  . TRULICITY 1.5 WN/0.2VO SOPN INJECT 1.5 MG INTO THE SKIN ONCE A WEEK. 6 mL 1  . Insulin Pen Needle (NOVOTWIST) 32G X 5 MM MISC USE AS DIRECTED.  Dx Diabetes 100 each 11   No facility-administered medications prior to visit.    Allergies  Allergen Reactions  . Atorvastatin Other (See Comments)    Leg weakness  . Latex Rash    ROS Review of Systems    Objective:    Physical Exam Constitutional:      Appearance: She is well-developed.  HENT:     Head: Normocephalic and atraumatic.  Cardiovascular:     Rate and Rhythm: Normal rate and regular rhythm.     Heart sounds: Normal heart sounds.  Pulmonary:     Effort: Pulmonary effort is normal.     Breath sounds: Normal breath sounds.  Skin:    General: Skin is warm and dry.  Neurological:     Mental Status: She is alert and oriented to person, place, and time.  Psychiatric:        Behavior: Behavior normal.     BP 131/68   Pulse 87   Ht 5' 4"  (1.626 m)   Wt 272 lb (123.4 kg)   SpO2 97%   BMI 46.69 kg/m  Wt Readings from Last 3 Encounters:  10/17/20 272 lb (123.4 kg)  03/04/20 268 lb (121.6 kg)  12/03/19 257 lb (116.6 kg)     Health Maintenance Due  Topic Date Due  . Pneumococcal Vaccine 58-91 Years old (1 of 4 - PCV13) Never done  . COVID-19 Vaccine (4 - Booster for Pfizer series) 05/26/2020    There are no preventive care reminders to display for this patient.  Lab Results  Component Value Date   TSH 2.800 12/23/2016   Lab Results  Component Value Date   WBC 6.6 05/08/2018   HGB 15.0 05/08/2018   HCT 46.4 (H) 05/08/2018   MCV 92.6 05/08/2018   PLT 299 05/08/2018   Lab Results  Component  Value Date   NA 139 03/04/2020   K 4.4 03/04/2020   CO2  29 03/04/2020   GLUCOSE 137 03/04/2020   BUN 22 03/04/2020   CREATININE 0.82 03/04/2020   BILITOT 0.4 09/20/2019   ALKPHOS 65 05/08/2018   AST 17 09/20/2019   ALT 24 09/20/2019   PROT 7.2 09/20/2019   ALBUMIN 4.2 05/08/2018   CALCIUM 9.9 03/04/2020   ANIONGAP 11 05/08/2018   Lab Results  Component Value Date   CHOL 141 09/20/2019   Lab Results  Component Value Date   HDL 49 (L) 09/20/2019   Lab Results  Component Value Date   LDLCALC 76 09/20/2019   Lab Results  Component Value Date   TRIG 76 09/20/2019   Lab Results  Component Value Date   CHOLHDL 2.9 09/20/2019   Lab Results  Component Value Date   HGBA1C 8.3 (A) 10/17/2020      Assessment & Plan:   Problem List Items Addressed This Visit      Cardiovascular and Mediastinum   ESSENTIAL HYPERTENSION, BENIGN - Primary    Blood pressure looks okay today little bit borderline we will definitely keep an eye on this.  Would like to see her lose some weight to and that would directly result in lowering of her blood pressure.  She is due for labs today so we will check on that as well.      Relevant Orders   CBC   COMPLETE METABOLIC PANEL WITH GFR   Lipid panel   B12     Endocrine   Diabetic retinopathy [facility])    Eye exam is up-to-date she will be due again in August around the time that I see her back.      Relevant Medications   glipiZIDE (GLUCOTROL XL) 2.5 MG 24 hr tablet   Dulaglutide (TRULICITY) 3 VQ/2.5ZD SOPN   Controlled diabetes mellitus type 2 with complications ([place])    We discussed working on getting her A1c down.  I will get a go ahead and bump up her Trulicity for now.  Hopefully the insurance will cover.  In the meantime she really feels committed to making some changes in getting back on track.  Encouraged her to just make a small change each week.  And then at some point start doing some walking for exercise shooting for at least 10  minutes twice a week.  I like to see her back in 90 days so that we can make sure were getting that A1c down with a goal of less than 7.      Relevant Medications   glipiZIDE (GLUCOTROL XL) 2.5 MG 24 hr tablet   Dulaglutide (TRULICITY) 3 GL/8.7FI SOPN   Other Relevant Orders   POCT glycosylated hemoglobin (Hb A1C) (Completed)   CBC   COMPLETE METABOLIC PANEL WITH GFR   Lipid panel   B12     Other   Severe obesity (BMI >= 40) (HCC)    BMI 46.  Discussed continuing to work on healthy diet and really getting on track with making some changes and eating low-carb and low sugar.      Relevant Medications   glipiZIDE (GLUCOTROL XL) 2.5 MG 24 hr tablet   Dulaglutide (TRULICITY) 3 EP/3.2RJ SOPN      He is also going to the pharmacy to get an additional COVID-vaccine she was hopeful that we had it in stock here today.  Meds ordered this encounter  Medications  . glipiZIDE (GLUCOTROL XL) 2.5 MG 24 hr tablet    Sig: Take 1 tablet (2.5 mg total) by mouth daily with  breakfast.    Dispense:  90 tablet    Refill:  3  . Dulaglutide (TRULICITY) 3 IY/3.4ZQ SOPN    Sig: Inject 3 mg as directed once a week.    Dispense:  2 mL    Refill:  3    Follow-up: Return in about 3 months (around 01/17/2021) for Diabetes follow-up.    Beatrice Lecher, MD

## 2020-10-21 ENCOUNTER — Encounter: Payer: Self-pay | Admitting: Family Medicine

## 2020-10-21 ENCOUNTER — Other Ambulatory Visit: Payer: Self-pay | Admitting: Family Medicine

## 2020-10-21 DIAGNOSIS — J3089 Other allergic rhinitis: Secondary | ICD-10-CM

## 2020-10-22 ENCOUNTER — Encounter: Payer: Self-pay | Admitting: Family Medicine

## 2020-10-22 ENCOUNTER — Telehealth: Payer: Self-pay | Admitting: Family Medicine

## 2020-10-22 DIAGNOSIS — E118 Type 2 diabetes mellitus with unspecified complications: Secondary | ICD-10-CM

## 2020-10-22 MED ORDER — XIGDUO XR 10-1000 MG PO TB24: 1.0000 | ORAL_TABLET | Freq: Every day | ORAL | 0 refills | Status: DC

## 2020-10-22 NOTE — Telephone Encounter (Signed)
Call patient and let her know that we received notification from [facility] that her Laurie Hoffman is not covered by her current insurance plan but they will cover Synjardy or Xigduo.  So new prescription sent to pharmacy for Xigduo.  It is fairly similar and should be pretty interchangeable but if she has any concerns or problems or side effects with the new medication and please let us know.  Make sure to keep next appointment for diabetes follow-up.

## 2020-10-23 NOTE — Telephone Encounter (Signed)
See telephone message

## 2020-10-23 NOTE — Telephone Encounter (Signed)
Pt notified of new medication.

## 2020-10-23 NOTE — Telephone Encounter (Signed)
Please see phone note from yesterday.

## 2020-10-30 LAB — COMPLETE METABOLIC PANEL WITH GFR
AG Ratio: 1.4 (calc) (ref 1.0–2.5)
ALT: 24 U/L (ref 6–29)
AST: 17 U/L (ref 10–35)
Albumin: 4 g/dL (ref 3.6–5.1)
Alkaline phosphatase (APISO): 84 U/L (ref 37–153)
BUN: 20 mg/dL (ref 7–25)
CO2: 24 mmol/L (ref 20–32)
Calcium: 9.4 mg/dL (ref 8.6–10.4)
Chloride: 96 mmol/L — ABNORMAL LOW (ref 98–110)
Creat: 0.97 mg/dL (ref 0.50–1.05)
GFR, Est African American: 77 mL/min/{1.73_m2} (ref >=60)
GFR, Est Non African American: 67 mL/min/{1.73_m2} (ref >=60)
Globulin: 2.9 g/dL (calc) (ref 1.9–3.7)
Glucose, Bld: 201 mg/dL — ABNORMAL HIGH (ref 65–99)
Potassium: 4.4 mmol/L (ref 3.5–5.3)
Sodium: 131 mmol/L — ABNORMAL LOW (ref 135–146)
Total Bilirubin: 0.4 mg/dL (ref 0.2–1.2)
Total Protein: 6.9 g/dL (ref 6.1–8.1)

## 2020-10-30 LAB — LIPID PANEL
Cholesterol: 158 mg/dL (ref <200)
HDL: 50 mg/dL (ref >=50)
LDL Cholesterol (Calc): 89 mg/dL (calc)
Non-HDL Cholesterol (Calc): 108 mg/dL (calc) (ref <130)
Total CHOL/HDL Ratio: 3.2 (calc) (ref <5.0)
Triglycerides: 92 mg/dL (ref <150)

## 2020-10-30 LAB — CBC
HCT: 44 % (ref 35.0–45.0)
Hemoglobin: 14.7 g/dL (ref 11.7–15.5)
MCH: 30.2 pg (ref 27.0–33.0)
MCHC: 33.4 g/dL (ref 32.0–36.0)
MCV: 90.3 fL (ref 80.0–100.0)
MPV: 9.4 fL (ref 7.5–12.5)
Platelets: 315 10*3/uL (ref 140–400)
RBC: 4.87 10*6/uL (ref 3.80–5.10)
RDW: 12.1 % (ref 11.0–15.0)
WBC: 5.8 10*3/uL (ref 3.8–10.8)

## 2020-10-30 LAB — VITAMIN B12: Vitamin B-12: 488 pg/mL (ref 200–1100)

## 2021-01-22 ENCOUNTER — Other Ambulatory Visit: Payer: Self-pay | Admitting: Family Medicine

## 2021-01-22 DIAGNOSIS — E118 Type 2 diabetes mellitus with unspecified complications: Secondary | ICD-10-CM

## 2021-01-23 ENCOUNTER — Other Ambulatory Visit: Payer: Self-pay

## 2021-01-23 ENCOUNTER — Ambulatory Visit: Payer: Managed Care, Other (non HMO) | Admitting: Family Medicine

## 2021-01-23 VITALS — BP 135/71 | HR 96 | Ht 64.0 in | Wt 272.1 lb

## 2021-01-23 DIAGNOSIS — E1169 Type 2 diabetes mellitus with other specified complication: Secondary | ICD-10-CM

## 2021-01-23 DIAGNOSIS — E118 Type 2 diabetes mellitus with unspecified complications: Secondary | ICD-10-CM | POA: Diagnosis not present

## 2021-01-23 DIAGNOSIS — Z23 Encounter for immunization: Secondary | ICD-10-CM

## 2021-01-23 DIAGNOSIS — I10 Essential (primary) hypertension: Secondary | ICD-10-CM | POA: Diagnosis not present

## 2021-01-23 DIAGNOSIS — E785 Hyperlipidemia, unspecified: Secondary | ICD-10-CM

## 2021-01-23 LAB — POCT GLYCOSYLATED HEMOGLOBIN (HGB A1C): Hemoglobin A1C: 11.4 % — AB (ref 4.0–5.6)

## 2021-01-23 MED ORDER — TRULICITY 3 MG/0.5ML ~~LOC~~ SOAJ: 3.0000 mg | SUBCUTANEOUS | 3 refills | Status: DC

## 2021-01-23 MED ORDER — TRESIBA FLEXTOUCH 100 UNIT/ML ~~LOC~~ SOPN: PEN_INJECTOR | SUBCUTANEOUS | 1 refills | Status: DC

## 2021-01-23 NOTE — Progress Notes (Signed)
Established Patient Office Visit  Subjective:  Patient ID: Laurie Hoffman, female    DOB: 03-Jun-1965  Age: 55 y.o. MRN: [id number]  CC:  Chief Complaint  Patient presents with   Diabetes   Hypertension   Hyperlipidemia    HPI Laurie Hoffman presents for   Hypertension- Pt denies chest pain, SOB, dizziness, or heart palpitations.  Taking meds as directed w/o problems.  Denies medication side effects.    Diabetes - no hypoglycemic events. No wounds or sores that are not healing well. No increased thirst or urination. Checking glucose at home.  Reports fasting blood sugar in the morning was around 200.  Taking medications as prescribed without any side effects.  She can tell that her blood sugars are elevated particularly in the evenings.  Hyperlipidemia - tolerating stating well with no myalgias or significant side effects.  Lab Results  Component Value Date   CHOL 158 10/29/2020   HDL 50 10/29/2020   LDLCALC 89 10/29/2020   TRIG 92 10/29/2020   CHOLHDL 3.2 10/29/2020      Past Medical History:  Diagnosis Date   Cellulitis 04/29/2018   Labia majora   Dry eye syndrome    Dyspnea    Eczema    Hypertension    Macular degeneration    Obesity    Seasonal allergies    Type 2 diabetes mellitus ([place])    followed by pcp   VIN III (vulvar intraepithelial neoplasia III)    Vulvar cancer (HCC)    Stage IB   Wears glasses     Past Surgical History:  Procedure Laterality Date   LYMPH NODE DISSECTION Left 05/11/2018   Procedure: LEFT INGUINAL LYMPH NODE  DISSECTION;  Surgeon: [name], MD;  Location: [facility];  Service: Gynecology;  Laterality: Left;   NO PAST SURGERIES     VULVECTOMY N/A 04/25/2018   Procedure: WIDE EXCISION VULVECTOMY;  Surgeon: Marti Sleigh, MD;  Location: [facility];  Service: Gynecology;  Laterality: N/A;    Family History  Problem Relation Age of Onset   Diabetes Mother        Insulin   Heart attack Mother     Cancer Father 30       melanoma   Diabetes Sister        Insulin   Hypertension Sister     Social History   Socioeconomic History   Marital status: Married    Spouse name: Not on file   Number of children: Not on file   Years of education: Not on file   Highest education level: Not on file  Occupational History   Not on file  Tobacco Use   Smoking status: Former    Years: 2.00    Types: Cigarettes    Quit date: 05/17/1986    Years since quitting: 34.7   Smokeless tobacco: Never   Tobacco comments:    1 pack per week  Vaping Use   Vaping Use: Never used  Substance and Sexual Activity   Alcohol use: Yes    Comment: very rare   Drug use: No   Sexual activity: Not Currently    Birth control/protection: None  Other Topics Concern   Not on file  Social History Narrative   Not on file   Social Determinants of Health   Financial Resource Strain: Not on file  Food Insecurity: Not on file  Transportation Needs: Not on file  Physical Activity: Not on file  Stress: Not on file  Social Connections: Not on file  Intimate Partner Violence: Not on file    Outpatient Medications Prior to Visit  Medication Sig Dispense Refill   TRULICITY 1.5 KK/9.3GH SOPN Inject 1.5 mg into the skin once a week.     ACCU-CHEK GUIDE test strip TEST UP 4 TIMES A DAY 400 strip 3   blood glucose meter kit and supplies Dispense based on patient and insurance preference. Use up to four times daily as directed. DX E11.8 1 each 0   cetirizine (ZYRTEC) 10 MG tablet Take 10 mg by mouth daily.     fluticasone (FLONASE) 50 MCG/ACT nasal spray Place 1 spray into both nostrils daily as needed for allergies or rhinitis.     montelukast (SINGULAIR) 10 MG tablet TAKE 1 TABLET BY MOUTH AT BEDTIME GENERIC EQUIVALENT FOR SINGULAIR 90 tablet 3   Multiple Vitamins-Minerals (WOMENS MULTIVITAMIN) TABS Take 1 tablet by mouth daily.     nystatin (MYCOSTATIN/NYSTOP) powder APPLY TO AFFECTED AREA 4 TIMES A DAY 60 g 2    simvastatin (ZOCOR) 20 MG tablet TAKE 1 TABLET(20 MG) BY MOUTH DAILY AT 6 PM 90 tablet 3   telmisartan-hydrochlorothiazide (MICARDIS HCT) 40-12.5 MG tablet TAKE 1 TABLET BY MOUTH EVERY DAY 90 tablet 1   XIGDUO XR 02-999 MG TB24 TAKE 1 TABLET BY MOUTH EVERY DAY WITH BREAKFAST 90 tablet 0   XIIDRA 5 % SOLN Place 1 drop into both eyes 2 (two) times daily.   11   Dulaglutide (TRULICITY) 3 WE/9.9BZ SOPN Inject 3 mg as directed once a week. 2 mL 3   glipiZIDE (GLUCOTROL XL) 2.5 MG 24 hr tablet Take 1 tablet (2.5 mg total) by mouth daily with breakfast. 90 tablet 3   No facility-administered medications prior to visit.    Allergies  Allergen Reactions   Atorvastatin Other (See Comments)    Leg weakness   Latex Rash    ROS Review of Systems    Objective:    Physical Exam Constitutional:      Appearance: Normal appearance. She is well-developed.  HENT:     Head: Normocephalic and atraumatic.  Cardiovascular:     Rate and Rhythm: Normal rate and regular rhythm.     Heart sounds: Normal heart sounds.  Pulmonary:     Effort: Pulmonary effort is normal.     Breath sounds: Normal breath sounds.  Skin:    General: Skin is warm and dry.  Neurological:     Mental Status: She is alert and oriented to person, place, and time.  Psychiatric:        Behavior: Behavior normal.   BP 135/71   Pulse 96   Ht 5' 4"  (1.626 m)   Wt 272 lb 1.3 oz (123.4 kg)   SpO2 97%   BMI 46.70 kg/m  Wt Readings from Last 3 Encounters:  01/23/21 272 lb 1.3 oz (123.4 kg)  10/17/20 272 lb (123.4 kg)  03/04/20 268 lb (121.6 kg)     Health Maintenance Due  Topic Date Due   Pneumococcal Vaccine 69-52 Years old (2 - PCV) 08/29/2008    There are no preventive care reminders to display for this patient.  Lab Results  Component Value Date   TSH 2.800 12/23/2016   Lab Results  Component Value Date   WBC 5.8 10/29/2020   HGB 14.7 10/29/2020   HCT 44.0 10/29/2020   MCV 90.3 10/29/2020   PLT 315  10/29/2020   Lab Results  Component Value  Date   NA 131 (L) 10/29/2020   K 4.4 10/29/2020   CO2 24 10/29/2020   GLUCOSE 201 (H) 10/29/2020   BUN 20 10/29/2020   CREATININE 0.97 10/29/2020   BILITOT 0.4 10/29/2020   ALKPHOS 65 05/08/2018   AST 17 10/29/2020   ALT 24 10/29/2020   PROT 6.9 10/29/2020   ALBUMIN 4.2 05/08/2018   CALCIUM 9.4 10/29/2020   ANIONGAP 11 05/08/2018   Lab Results  Component Value Date   CHOL 158 10/29/2020   Lab Results  Component Value Date   HDL 50 10/29/2020   Lab Results  Component Value Date   LDLCALC 89 10/29/2020   Lab Results  Component Value Date   TRIG 92 10/29/2020   Lab Results  Component Value Date   CHOLHDL 3.2 10/29/2020   Lab Results  Component Value Date   HGBA1C 11.4 (A) 01/23/2021      Assessment & Plan:   Problem List Items Addressed This Visit       Cardiovascular and Mediastinum   ESSENTIAL HYPERTENSION, BENIGN - Primary     Endocrine   Hyperlipidemia associated with type 2 diabetes mellitus ([place])   Relevant Medications   Dulaglutide (TRULICITY) 3 SV/7.7LT SOPN   insulin degludec (TRESIBA FLEXTOUCH) 100 UNIT/ML FlexTouch Pen   Controlled diabetes mellitus type 2 with complications (H[facility])    Uncontrolled.  A1c of 11.4 today which is up from previous of 8.3.  We did have to make some changes to her regimen because of insurance coverage at the last visit.  She feels like the Merleen Nicely is not working as well as her previous invoke a met.  She is currently on 1.5 mg of Trulicity but did not get the 3 mg dose.  Admits that she still struggling mostly with carbs.  But she has tried to really put focus back on her diet over the last month.  But has not been exercising as consistently.  She tries to get in about [phone] steps per day but has not been as consistent.will start long acting insulin.  Start with Tyler Aas 10 units and increase by 2 units every other day until fasting blood sugars are less than 140.  Follow-up in 6  weeks.      Relevant Medications   Dulaglutide (TRULICITY) 3 JQ/3.0SP SOPN   insulin degludec (TRESIBA FLEXTOUCH) 100 UNIT/ML FlexTouch Pen   Other Relevant Orders   POCT glycosylated hemoglobin (Hb A1C) (Completed)   Other Visit Diagnoses     Need for immunization against influenza       Relevant Orders   Flu Vaccine QUAD 71moIM (Fluarix, Fluzone & Alfiuria Quad PF) (Completed)   Need for pneumococcal vaccination       Relevant Orders   Pneumococcal conjugate vaccine 20-valent (Prevnar 20) (Completed)       Meds ordered this encounter  Medications   Dulaglutide (TRULICITY) 3 MQZ/3.0QTSOPN    Sig: Inject 3 mg as directed once a week.    Dispense:  2 mL    Refill:  3    Please d/c 1.5 dose on file   insulin degludec (TRESIBA FLEXTOUCH) 100 UNIT/ML FlexTouch Pen    Sig: 10 units SQ QHS, increase by 2 units every 2 days until morning glucose is under 140, then stay on that dose.    Dispense:  15 mL    Refill:  1     Follow-up: Return in about 6 weeks (around 03/06/2021) for Diabetes follow-up.    CBeatrice Lecher MD

## 2021-01-23 NOTE — Assessment & Plan Note (Signed)
Uncontrolled.  A1c of 11.4 today which is up from previous of 8.3.  We did have to make some changes to her regimen because of insurance coverage at the last visit.  She feels like the Merleen Nicely is not working as well as her previous invoke a met.  She is currently on 1.5 mg of Trulicity but did not get the 3 mg dose.  Admits that she still struggling mostly with carbs.  But she has tried to really put focus back on her diet over the last month.  But has not been exercising as consistently.  She tries to get in about [phone] steps per day but has not been as consistent.will start long acting insulin.  Start with Tyler Aas 10 units and increase by 2 units every other day until fasting blood sugars are less than 140.  Follow-up in 6 weeks.

## 2021-01-23 NOTE — Patient Instructions (Signed)
Please stop the glipizide.   Start the

## 2021-01-27 ENCOUNTER — Encounter: Payer: Self-pay | Admitting: Family Medicine

## 2021-01-27 MED ORDER — INSULIN PEN NEEDLE 31G X 8 MM MISC: 3 refills | Status: DC

## 2021-02-14 ENCOUNTER — Other Ambulatory Visit: Payer: Self-pay | Admitting: Family Medicine

## 2021-03-06 ENCOUNTER — Ambulatory Visit: Payer: Managed Care, Other (non HMO) | Admitting: Family Medicine

## 2021-03-06 ENCOUNTER — Encounter: Payer: Self-pay | Admitting: Family Medicine

## 2021-03-06 ENCOUNTER — Other Ambulatory Visit: Payer: Self-pay

## 2021-03-06 VITALS — BP 126/59 | HR 102 | Ht 64.0 in | Wt 271.0 lb

## 2021-03-06 DIAGNOSIS — I10 Essential (primary) hypertension: Secondary | ICD-10-CM | POA: Diagnosis not present

## 2021-03-06 DIAGNOSIS — E1169 Type 2 diabetes mellitus with other specified complication: Secondary | ICD-10-CM | POA: Diagnosis not present

## 2021-03-06 DIAGNOSIS — E118 Type 2 diabetes mellitus with unspecified complications: Secondary | ICD-10-CM

## 2021-03-06 DIAGNOSIS — E785 Hyperlipidemia, unspecified: Secondary | ICD-10-CM | POA: Diagnosis not present

## 2021-03-06 LAB — POCT GLYCOSYLATED HEMOGLOBIN (HGB A1C): Hemoglobin A1C: 9.3 % — AB (ref 4.0–5.6)

## 2021-03-06 MED ORDER — TRULICITY 4.5 MG/0.5ML ~~LOC~~ SOAJ: 4.5000 mg | SUBCUTANEOUS | 1 refills | Status: DC

## 2021-03-06 MED ORDER — TRESIBA FLEXTOUCH 100 UNIT/ML ~~LOC~~ SOPN: PEN_INJECTOR | SUBCUTANEOUS | 1 refills | Status: DC

## 2021-03-06 NOTE — Assessment & Plan Note (Signed)
Blood pressure is at goal and looks fantastic today.  Continue current regimen.

## 2021-03-06 NOTE — Patient Instructions (Signed)
When you run out of the Xigduo I can change you from the 02/999 to the 09/998 and that way you can take it twice a day and that I will maximize the metformin but keep the Xigduo at a total of 10 mg just let me know when you get close to running out.

## 2021-03-06 NOTE — Assessment & Plan Note (Signed)
Continue with simvastatin 

## 2021-03-06 NOTE — Progress Notes (Signed)
Established Patient Office Visit  Subjective:  Patient ID: Laurie Hoffman, female    DOB: 03-12-1966  Age: 55 y.o. MRN: [id number]  CC:  Chief Complaint  Patient presents with   Diabetes   Hypertension    HPI [name] presents for 2-week follow-up for diabetes.  When I last saw her on September A1c was significantly elevated 11.4 which was up from previous of 8.3.  We had to make some changes to her regimen because of insurance cover coverage.  We decided to start Tresiba 10 units daily and increase by 2 units every other day until fasting blood sugars were less than 140.  She is currently on Xigduo, 3 mg of Trulicity, and the [place].  She is up to 25 units at night.   She has been try to get in her 6000 steps per day.  As result been also trying to keep her heart level up a little bit with activity so she uses a smart watch.  She has been trying to eat better and eat more solids etc.  Her postprandials have typically been in the low 200s.  Past Medical History:  Diagnosis Date   Cellulitis 04/29/2018   Labia majora   Dry eye syndrome    Dyspnea    Eczema    Hypertension    Macular degeneration    Obesity    Seasonal allergies    Type 2 diabetes mellitus ([place])    followed by pcp   VIN III (vulvar intraepithelial neoplasia III)    Vulvar cancer ([place])    Stage IB   Wears glasses     Past Surgical History:  Procedure Laterality Date   LYMPH NODE DISSECTION Left 05/11/2018   Procedure: LEFT INGUINAL LYMPH NODE  DISSECTION;  Surgeon: [name], MD;  Location: [facility];  Service: Gynecology;  Laterality: Left;   NO PAST SURGERIES     VULVECTOMY N/A 04/25/2018   Procedure: WIDE EXCISION VULVECTOMY;  Surgeon: Marti Sleigh, MD;  Location: [facility];  Service: Gynecology;  Laterality: N/A;    Family History  Problem Relation Age of Onset   Diabetes Mother        Insulin   Heart attack Mother    Cancer Father 65       melanoma   Diabetes  Sister        Insulin   Hypertension Sister     Social History   Socioeconomic History   Marital status: Married    Spouse name: Not on file   Number of children: Not on file   Years of education: Not on file   Highest education level: Not on file  Occupational History   Not on file  Tobacco Use   Smoking status: Former    Years: 2.00    Types: Cigarettes    Quit date: 05/17/1986    Years since quitting: 34.8   Smokeless tobacco: Never   Tobacco comments:    1 pack per week  Vaping Use   Vaping Use: Never used  Substance and Sexual Activity   Alcohol use: Yes    Comment: very rare   Drug use: No   Sexual activity: Not Currently    Birth control/protection: None  Other Topics Concern   Not on file  Social History Narrative   Not on file   Social Determinants of Health   Financial Resource Strain: Not on file  Food Insecurity: Not on file  Transportation Needs: Not on file  Physical Activity: Not on file  Stress: Not on file  Social Connections: Not on file  Intimate Partner Violence: Not on file    Outpatient Medications Prior to Visit  Medication Sig Dispense Refill   ACCU-CHEK GUIDE test strip TEST UP 4 TIMES A DAY 400 strip 3   blood glucose meter kit and supplies Dispense based on patient and insurance preference. Use up to four times daily as directed. DX E11.8 1 each 0   cetirizine (ZYRTEC) 10 MG tablet Take 10 mg by mouth daily.     fluticasone (FLONASE) 50 MCG/ACT nasal spray Place 1 spray into both nostrils daily as needed for allergies or rhinitis.     Insulin Pen Needle 31G X 8 MM MISC Use to inject insulin once daily at bedtime. 100 each 3   montelukast (SINGULAIR) 10 MG tablet TAKE 1 TABLET BY MOUTH AT BEDTIME GENERIC EQUIVALENT FOR SINGULAIR 90 tablet 3   Multiple Vitamins-Minerals (WOMENS MULTIVITAMIN) TABS Take 1 tablet by mouth daily.     nystatin (MYCOSTATIN/NYSTOP) powder APPLY TO AFFECTED AREA 4 TIMES A DAY 60 g 2   simvastatin (ZOCOR) 20 MG  tablet TAKE 1 TABLET(20 MG) BY MOUTH DAILY AT 6 PM 90 tablet 3   telmisartan-hydrochlorothiazide (MICARDIS HCT) 40-12.5 MG tablet TAKE 1 TABLET BY MOUTH EVERY DAY 90 tablet 1   XIGDUO XR 02-999 MG TB24 TAKE 1 TABLET BY MOUTH EVERY DAY WITH BREAKFAST 90 tablet 0   XIIDRA 5 % SOLN Place 1 drop into both eyes 2 (two) times daily.   11   Dulaglutide (TRULICITY) 3 YN/8.2NF SOPN Inject 3 mg as directed once a week. 2 mL 3   insulin degludec (TRESIBA FLEXTOUCH) 100 UNIT/ML FlexTouch Pen 10 units SQ QHS, increase by 2 units every 2 days until morning glucose is under 140, then stay on that dose. 15 mL 1   No facility-administered medications prior to visit.    Allergies  Allergen Reactions   Atorvastatin Other (See Comments)    Leg weakness   Latex Rash    ROS Review of Systems    Objective:    Physical Exam Constitutional:      Appearance: Normal appearance. She is well-developed.  HENT:     Head: Normocephalic and atraumatic.  Cardiovascular:     Rate and Rhythm: Normal rate and regular rhythm.     Heart sounds: Normal heart sounds.  Pulmonary:     Effort: Pulmonary effort is normal.     Breath sounds: Normal breath sounds.  Skin:    General: Skin is warm and dry.  Neurological:     Mental Status: She is alert and oriented to person, place, and time.  Psychiatric:        Behavior: Behavior normal.    BP (!) 126/59   Pulse (!) 102   Ht 5' 4"  (1.626 m)   Wt 271 lb (122.9 kg)   SpO2 97%   BMI 46.52 kg/m  Wt Readings from Last 3 Encounters:  03/06/21 271 lb (122.9 kg)  01/23/21 272 lb 1.3 oz (123.4 kg)  10/17/20 272 lb (123.4 kg)     There are no preventive care reminders to display for this patient.  There are no preventive care reminders to display for this patient.  Lab Results  Component Value Date   TSH 2.800 12/23/2016   Lab Results  Component Value Date   WBC 5.8 10/29/2020   HGB 14.7 10/29/2020   HCT 44.0 10/29/2020   MCV 90.3 10/29/2020  PLT 315  10/29/2020   Lab Results  Component Value Date   NA 131 (L) 10/29/2020   K 4.4 10/29/2020   CO2 24 10/29/2020   GLUCOSE 201 (H) 10/29/2020   BUN 20 10/29/2020   CREATININE 0.97 10/29/2020   BILITOT 0.4 10/29/2020   ALKPHOS 65 05/08/2018   AST 17 10/29/2020   ALT 24 10/29/2020   PROT 6.9 10/29/2020   ALBUMIN 4.2 05/08/2018   CALCIUM 9.4 10/29/2020   ANIONGAP 11 05/08/2018   Lab Results  Component Value Date   CHOL 158 10/29/2020   Lab Results  Component Value Date   HDL 50 10/29/2020   Lab Results  Component Value Date   LDLCALC 89 10/29/2020   Lab Results  Component Value Date   TRIG 92 10/29/2020   Lab Results  Component Value Date   CHOLHDL 3.2 10/29/2020   Lab Results  Component Value Date   HGBA1C 9.3 (A) 03/06/2021      Assessment & Plan:   Problem List Items Addressed This Visit       Cardiovascular and Mediastinum   ESSENTIAL HYPERTENSION, BENIGN    Blood pressure is at goal and looks fantastic today.  Continue current regimen.        Endocrine   Hyperlipidemia associated with type 2 diabetes mellitus ([place])    Continue with simvastatin.      Relevant Medications   Dulaglutide (TRULICITY) 4.5 HL/4.5GY SOPN   insulin degludec (TRESIBA FLEXTOUCH) 100 UNIT/ML FlexTouch Pen   Controlled diabetes mellitus type 2 with complications ([place]) - Primary    Blood sugars on her glucometer overall look like they are trending down.  Last 7-day average is in the 190s.  Previously over 200.  I do think she has made great strides.  Up to 26 units on the Tresiba.  She is sort of has paused at that dose.  But we did discuss making sure that she is taking it every night and not withholding it if his blood if her blood sugar is still in the 100s before she goes to bed.  We talked a little bit about how the insulin kicks in and when it peaks.  She can slowly go up by 1 unit every other day until her fastings are under 130.  Really encouraged her to continue to be  consistent with her steps per day as well as working on portion control.  She did try the 3 mg Trulicity for about a month but they did not have it in stock so she had to drop back down to the 1.5 for a week.  But she did receive a phone call today that it is back in stock.  So the plan is for her pick up 1 more month and then in 4 weeks she will go up to the 4.5 mg Trulicity.  I was hoping we might see a little bit more weight loss on the 3 mg but again she has only been on it for a month so far.      Relevant Medications   Dulaglutide (TRULICITY) 4.5 BW/3.8LH SOPN   insulin degludec (TRESIBA FLEXTOUCH) 100 UNIT/ML FlexTouch Pen   Other Relevant Orders   POCT glycosylated hemoglobin (Hb A1C) (Completed)    Meds ordered this encounter  Medications   Dulaglutide (TRULICITY) 4.5 TD/4.2AJ SOPN    Sig: Inject 4.5 mg as directed once a week.    Dispense:  6 mL    Refill:  1    Please  fill next montn 04/05/2021 and d/c all refills for the 70m   insulin degludec (TRESIBA FLEXTOUCH) 100 UNIT/ML FlexTouch Pen    Sig: 30-40 units SQ QHS, increase by 1 units every 2 days until morning glucose is under 140, then stay on that dose.    Dispense:  15 mL    Refill:  1    Follow-up: Return in about 7 weeks (around 04/24/2021) for Diabetes follow-up.    CBeatrice Lecher MD

## 2021-03-06 NOTE — Assessment & Plan Note (Signed)
Blood sugars on her glucometer overall look like they are trending down.  Last 7-day average is in the 190s.  Previously over 200.  I do think she has made great strides.  Up to 26 units on the Tresiba.  She is sort of has paused at that dose.  But we did discuss making sure that she is taking it every night and not withholding it if his blood if her blood sugar is still in the 100s before she goes to bed.  We talked a little bit about how the insulin kicks in and when it peaks.  She can slowly go up by 1 unit every other day until her fastings are under 130.  Really encouraged her to continue to be consistent with her steps per day as well as working on portion control.  She did try the 3 mg Trulicity for about a month but they did not have it in stock so she had to drop back down to the 1.5 for a week.  But she did receive a phone call today that it is back in stock.  So the plan is for her pick up 1 more month and then in 4 weeks she will go up to the 4.5 mg Trulicity.  I was hoping we might see a little bit more weight loss on the 3 mg but again she has only been on it for a month so far.

## 2021-03-06 NOTE — Progress Notes (Signed)
Home glucose monitor  7  day: 196  14 day: 213  30 day: 214  90 day: 219

## 2021-04-18 ENCOUNTER — Encounter: Payer: Self-pay | Admitting: Family Medicine

## 2021-04-22 MED ORDER — XIGDUO XR 5-1000 MG PO TB24: 2.0000 | ORAL_TABLET | Freq: Every day | ORAL | 1 refills | Status: DC

## 2021-04-22 NOTE — Telephone Encounter (Signed)
Meds ordered this encounter  Medications   Dapagliflozin-metFORMIN HCl ER (XIGDUO XR) 09-998 MG TB24    Sig: Take 2 tablets by mouth daily.    Dispense:  180 tablet    Refill:  1

## 2021-04-23 ENCOUNTER — Other Ambulatory Visit: Payer: Self-pay | Admitting: Family Medicine

## 2021-04-23 DIAGNOSIS — E118 Type 2 diabetes mellitus with unspecified complications: Secondary | ICD-10-CM

## 2021-04-24 ENCOUNTER — Encounter: Payer: Self-pay | Admitting: Family Medicine

## 2021-04-24 ENCOUNTER — Ambulatory Visit: Payer: Managed Care, Other (non HMO) | Admitting: Family Medicine

## 2021-04-24 ENCOUNTER — Other Ambulatory Visit: Payer: Self-pay

## 2021-04-24 DIAGNOSIS — I10 Essential (primary) hypertension: Secondary | ICD-10-CM | POA: Diagnosis not present

## 2021-04-24 DIAGNOSIS — E118 Type 2 diabetes mellitus with unspecified complications: Secondary | ICD-10-CM

## 2021-04-24 NOTE — Assessment & Plan Note (Signed)
BMI 47-just continue to work on [employer], portion control and increasing activity level.  She is doing great getting in 6000 steps per day.

## 2021-04-24 NOTE — Assessment & Plan Note (Signed)
Pressure little borderline today but we will keep an eye on it.

## 2021-04-24 NOTE — Progress Notes (Signed)
Established Patient Office Visit  Subjective:  Patient ID: Laurie Hoffman, female    DOB: Jun 02, 1965  Age: 55 y.o. MRN: [id number]  CC:  Chief Complaint  Patient presents with   Diabetes    A1c=7.7% previous was 9.3%    HPI  Laurie Hoffman presents for   Diabetes - no hypoglycemic events. No wounds or sores that are not healing well. No increased thirst or urination. Checking glucose at home. Taking medications as prescribed without any side effects. She has been on the Trulicity 4.5 mg for about 2 to 3 weeks at this point she is tolerating it well without any problems.  We just sent in an adjustment dose on her Xigduo.  So she has not started the new one yet.  She still walking about 6000 steps per day.  She is starting to see blood sugars under 130.  In fact she decreased her [place] down by 2 units.  She was doing 38 units and she went down to 36 units.   Past Medical History:  Diagnosis Date   Cellulitis 04/29/2018   Labia majora   Dry eye syndrome    Dyspnea    Eczema    Hypertension    Macular degeneration    Obesity    Seasonal allergies    Type 2 diabetes mellitus ([place])    followed by pcp   VIN III (vulvar intraepithelial neoplasia III)    Vulvar cancer ([place])    Stage IB   Wears glasses     Past Surgical History:  Procedure Laterality Date   LYMPH NODE DISSECTION Left 05/11/2018   Procedure: LEFT INGUINAL LYMPH NODE  DISSECTION;  Surgeon: [name], MD;  Location: [facility];  Service: Gynecology;  Laterality: Left;   NO PAST SURGERIES     VULVECTOMY N/A 04/25/2018   Procedure: WIDE EXCISION VULVECTOMY;  Surgeon: Marti Sleigh, MD;  Location: [facility];  Service: Gynecology;  Laterality: N/A;    Family History  Problem Relation Age of Onset   Diabetes Mother        Insulin   Heart attack Mother    Cancer Father 49       melanoma   Diabetes Sister        Insulin   Hypertension Sister     Social History   Socioeconomic History    Marital status: Married    Spouse name: Not on file   Number of children: Not on file   Years of education: Not on file   Highest education level: Not on file  Occupational History   Not on file  Tobacco Use   Smoking status: Former    Years: 2.00    Types: Cigarettes    Quit date: 05/17/1986    Years since quitting: 34.9   Smokeless tobacco: Never   Tobacco comments:    1 pack per week  Vaping Use   Vaping Use: Never used  Substance and Sexual Activity   Alcohol use: Yes    Comment: very rare   Drug use: No   Sexual activity: Not Currently    Birth control/protection: None  Other Topics Concern   Not on file  Social History Narrative   Not on file   Social Determinants of Health   Financial Resource Strain: Not on file  Food Insecurity: Not on file  Transportation Needs: Not on file  Physical Activity: Not on file  Stress: Not on file  Social Connections: Not on file  Intimate Partner Violence: Not on file    Outpatient Medications Prior to Visit  Medication Sig Dispense Refill   ACCU-CHEK GUIDE test strip TEST UP 4 TIMES A DAY 400 strip 3   blood glucose meter kit and supplies Dispense based on patient and insurance preference. Use up to four times daily as directed. DX E11.8 1 each 0   cetirizine (ZYRTEC) 10 MG tablet Take 10 mg by mouth daily.     Dapagliflozin-metFORMIN HCl ER (XIGDUO XR) 09-998 MG TB24 Take 2 tablets by mouth daily. 180 tablet 1   Dulaglutide (TRULICITY) 4.5 PO/2.4MP SOPN Inject 4.5 mg as directed once a week. 6 mL 1   fluticasone (FLONASE) 50 MCG/ACT nasal spray Place 1 spray into both nostrils daily as needed for allergies or rhinitis.     insulin degludec (TRESIBA FLEXTOUCH) 100 UNIT/ML FlexTouch Pen 30-40 units SQ QHS, increase by 1 units every 2 days until morning glucose is under 140, then stay on that dose. 15 mL 1   Insulin Pen Needle 31G X 8 MM MISC Use to inject insulin once daily at bedtime. 100 each 3   montelukast (SINGULAIR) 10  MG tablet TAKE 1 TABLET BY MOUTH AT BEDTIME GENERIC EQUIVALENT FOR SINGULAIR 90 tablet 3   Multiple Vitamins-Minerals (WOMENS MULTIVITAMIN) TABS Take 1 tablet by mouth daily.     nystatin (MYCOSTATIN/NYSTOP) powder APPLY TO AFFECTED AREA 4 TIMES A DAY 60 g 2   simvastatin (ZOCOR) 20 MG tablet TAKE 1 TABLET(20 MG) BY MOUTH DAILY AT 6 PM 90 tablet 3   telmisartan-hydrochlorothiazide (MICARDIS HCT) 40-12.5 MG tablet TAKE 1 TABLET BY MOUTH EVERY DAY 90 tablet 1   XIIDRA 5 % SOLN Place 1 drop into both eyes 2 (two) times daily.   11   No facility-administered medications prior to visit.    Allergies  Allergen Reactions   Atorvastatin Other (See Comments)    Leg weakness   Latex Rash    ROS Review of Systems    Objective:    Physical Exam Constitutional:      Appearance: Normal appearance. She is well-developed.  HENT:     Head: Normocephalic and atraumatic.  Cardiovascular:     Rate and Rhythm: Normal rate and regular rhythm.     Heart sounds: Normal heart sounds.  Pulmonary:     Effort: Pulmonary effort is normal.     Breath sounds: Normal breath sounds.  Skin:    General: Skin is warm and dry.  Neurological:     Mental Status: She is alert and oriented to person, place, and time.  Psychiatric:        Behavior: Behavior normal.    BP 133/67   Pulse (!) 103   Ht 5' 4"  (1.626 m)   Wt 275 lb (124.7 kg)   SpO2 99%   BMI 47.20 kg/m  Wt Readings from Last 3 Encounters:  04/24/21 275 lb (124.7 kg)  03/06/21 271 lb (122.9 kg)  01/23/21 272 lb 1.3 oz (123.4 kg)     There are no preventive care reminders to display for this patient.  There are no preventive care reminders to display for this patient.  Lab Results  Component Value Date   TSH 2.800 12/23/2016   Lab Results  Component Value Date   WBC 5.8 10/29/2020   HGB 14.7 10/29/2020   HCT 44.0 10/29/2020   MCV 90.3 10/29/2020   PLT 315 10/29/2020   Lab Results  Component Value Date   NA 131 (L)  10/29/2020    K 4.4 10/29/2020   CO2 24 10/29/2020   GLUCOSE 201 (H) 10/29/2020   BUN 20 10/29/2020   CREATININE 0.97 10/29/2020   BILITOT 0.4 10/29/2020   ALKPHOS 65 05/08/2018   AST 17 10/29/2020   ALT 24 10/29/2020   PROT 6.9 10/29/2020   ALBUMIN 4.2 05/08/2018   CALCIUM 9.4 10/29/2020   ANIONGAP 11 05/08/2018   Lab Results  Component Value Date   CHOL 158 10/29/2020   Lab Results  Component Value Date   HDL 50 10/29/2020   Lab Results  Component Value Date   LDLCALC 89 10/29/2020   Lab Results  Component Value Date   TRIG 92 10/29/2020   Lab Results  Component Value Date   CHOLHDL 3.2 10/29/2020   Lab Results  Component Value Date   HGBA1C 9.3 (A) 03/06/2021      Assessment & Plan:   Problem List Items Addressed This Visit       Cardiovascular and Mediastinum   ESSENTIAL HYPERTENSION, BENIGN    Pressure little borderline today but we will keep an eye on it.        Endocrine   Controlled diabetes mellitus type 2 with complications ([facility])    Y5W still not at goal but improving greatly.  In fact I am very proud of the changes that she has made.  He is tolerating the medication well.  Continue to work on [employer] and increasing activity levels and setting some goals for herself over the next couple of months.  Follow-up in 8 weeks to make sure that we are still staying on track with the medications.  We discussed that I really really want to work on getting her off of insulin over the next 6 to 9 months.      Relevant Orders   BASIC METABOLIC PANEL WITH GFR     Other   Severe obesity (BMI >= 40) (HCC)    BMI 47-just continue to work on healthy food choices, portion control and increasing activity level.  She is doing great getting in 6000 steps per day.       No orders of the defined types were placed in this encounter.   Follow-up: Return in about 8 weeks (around 06/19/2021) for Diabetes follow-up.    Beatrice Lecher, MD

## 2021-04-24 NOTE — Assessment & Plan Note (Addendum)
A1c still not at goal but improving greatly.  In fact I am very proud of the changes that she has made.  He is tolerating the medication well.  Continue to work on [employer] and increasing activity levels and setting some goals for herself over the next couple of months.  Follow-up in 8 weeks to make sure that we are still staying on track with the medications.  We discussed that I really really want to work on getting her off of insulin over the next 6 to 9 months.

## 2021-04-25 LAB — BASIC METABOLIC PANEL WITH GFR
BUN: 20 mg/dL (ref 7–25)
CO2: 29 mmol/L (ref 20–32)
Calcium: 9.4 mg/dL (ref 8.6–10.4)
Chloride: 99 mmol/L (ref 98–110)
Creat: 0.82 mg/dL (ref 0.50–1.03)
Glucose, Bld: 111 mg/dL — ABNORMAL HIGH (ref 65–99)
Potassium: 4.1 mmol/L (ref 3.5–5.3)
Sodium: 137 mmol/L (ref 135–146)
eGFR: 85 mL/min/{1.73_m2} (ref >=60)

## 2021-04-27 NOTE — Progress Notes (Signed)
Hi Laurie Hoffman, great news your sodium is back in the normal range and your chloride is also back to normal which is great.

## 2021-05-01 ENCOUNTER — Encounter: Payer: Self-pay | Admitting: Family Medicine

## 2021-06-05 ENCOUNTER — Other Ambulatory Visit: Payer: Self-pay | Admitting: Family Medicine

## 2021-06-05 DIAGNOSIS — E118 Type 2 diabetes mellitus with unspecified complications: Secondary | ICD-10-CM

## 2021-06-12 ENCOUNTER — Encounter: Payer: Self-pay | Admitting: Family Medicine

## 2021-06-12 NOTE — Telephone Encounter (Signed)
Please just recommend some symptomatic care.  She is medium risk I do not think we need to do Paxil bid.

## 2021-06-19 ENCOUNTER — Ambulatory Visit: Payer: Managed Care, Other (non HMO) | Admitting: Family Medicine

## 2021-06-29 ENCOUNTER — Other Ambulatory Visit: Payer: Self-pay

## 2021-06-29 ENCOUNTER — Encounter: Payer: Self-pay | Admitting: Family Medicine

## 2021-06-29 ENCOUNTER — Ambulatory Visit: Payer: Managed Care, Other (non HMO) | Admitting: Family Medicine

## 2021-06-29 VITALS — BP 136/61 | HR 93 | Resp 16 | Ht 64.0 in | Wt 282.0 lb

## 2021-06-29 DIAGNOSIS — E118 Type 2 diabetes mellitus with unspecified complications: Secondary | ICD-10-CM | POA: Diagnosis not present

## 2021-06-29 DIAGNOSIS — I10 Essential (primary) hypertension: Secondary | ICD-10-CM | POA: Diagnosis not present

## 2021-06-29 DIAGNOSIS — Z1231 Encounter for screening mammogram for malignant neoplasm of breast: Secondary | ICD-10-CM | POA: Diagnosis not present

## 2021-06-29 LAB — POCT GLYCOSYLATED HEMOGLOBIN (HGB A1C): Hemoglobin A1C: 7.1 % — AB (ref 4.0–5.6)

## 2021-06-29 NOTE — Assessment & Plan Note (Signed)
Well controlled. Continue current regimen. Follow up in  3-4 mo  

## 2021-06-29 NOTE — Progress Notes (Signed)
Established Patient Office Visit  Subjective:  Patient ID: Laurie Hoffman, female    DOB: 1966/04/12  Age: 56 y.o. MRN: [id number]  CC:  Chief Complaint  Patient presents with   Diabetes    Follow up     HPI Laurie Hoffman presents for   Hypertension- Pt denies chest pain, SOB, dizziness, or heart palpitations.  Taking meds as directed w/o problems.  Denies medication side effects.    Diabetes - no hypoglycemic events. No wounds or sores that are not healing well. No increased thirst or urination. Checking glucose at home. Taking medications as prescribed without any side effects.  Tolerating the 4.5 mg Trulicity well.  Plans on starting to do light stance and some walking for exercise.  She did have a cold in December and then COVID at the end of January but she is feeling much better.  She has had all of her vaccines.  Past Medical History:  Diagnosis Date   Cellulitis 04/29/2018   Labia majora   Dry eye syndrome    Dyspnea    Eczema    Hypertension    Macular degeneration    Obesity    Seasonal allergies    Type 2 diabetes mellitus ([place])    followed by pcp   VIN III (vulvar intraepithelial neoplasia III)    Vulvar cancer ([place])    Stage IB   Wears glasses     Past Surgical History:  Procedure Laterality Date   LYMPH NODE DISSECTION Left 05/11/2018   Procedure: LEFT INGUINAL LYMPH NODE  DISSECTION;  Surgeon: [name], MD;  Location: [facility];  Service: Gynecology;  Laterality: Left;   NO PAST SURGERIES     VULVECTOMY N/A 04/25/2018   Procedure: WIDE EXCISION VULVECTOMY;  Surgeon: Marti Sleigh, MD;  Location: [facility];  Service: Gynecology;  Laterality: N/A;    Family History  Problem Relation Age of Onset   Diabetes Mother        Insulin   Heart attack Mother    Cancer Father 21       melanoma   Diabetes Sister        Insulin   Hypertension Sister     Social History   Socioeconomic History   Marital status: Married     Spouse name: Not on file   Number of children: Not on file   Years of education: Not on file   Highest education level: Not on file  Occupational History   Not on file  Tobacco Use   Smoking status: Former    Years: 2.00    Types: Cigarettes    Quit date: 05/17/1986    Years since quitting: 35.1   Smokeless tobacco: Never   Tobacco comments:    1 pack per week  Vaping Use   Vaping Use: Never used  Substance and Sexual Activity   Alcohol use: Yes    Comment: very rare   Drug use: No   Sexual activity: Not Currently    Birth control/protection: None  Other Topics Concern   Not on file  Social History Narrative   Not on file   Social Determinants of Health   Financial Resource Strain: Not on file  Food Insecurity: Not on file  Transportation Needs: Not on file  Physical Activity: Not on file  Stress: Not on file  Social Connections: Not on file  Intimate Partner Violence: Not on file    Outpatient Medications Prior to Visit  Medication Sig  Dispense Refill   ACCU-CHEK GUIDE test strip TEST UP 4 TIMES A DAY 400 strip 3   blood glucose meter kit and supplies Dispense based on patient and insurance preference. Use up to four times daily as directed. DX E11.8 1 each 0   cetirizine (ZYRTEC) 10 MG tablet Take 10 mg by mouth daily.     Dapagliflozin-metFORMIN HCl ER (XIGDUO XR) 09-998 MG TB24 Take 2 tablets by mouth daily. 180 tablet 1   Dulaglutide (TRULICITY) 4.5 KD/9.8PJ SOPN Inject 4.5 mg as directed once a week. 6 mL 1   fluticasone (FLONASE) 50 MCG/ACT nasal spray Place 1 spray into both nostrils daily as needed for allergies or rhinitis.     insulin degludec (TRESIBA FLEXTOUCH) 100 UNIT/ML FlexTouch Pen Inject 36 Units into the skin daily. 12 mL 0   Insulin Pen Needle 31G X 8 MM MISC Use to inject insulin once daily at bedtime. 100 each 3   montelukast (SINGULAIR) 10 MG tablet TAKE 1 TABLET BY MOUTH AT BEDTIME GENERIC EQUIVALENT FOR SINGULAIR 90 tablet 3   Multiple  Vitamins-Minerals (WOMENS MULTIVITAMIN) TABS Take 1 tablet by mouth daily.     nystatin (MYCOSTATIN/NYSTOP) powder APPLY TO AFFECTED AREA 4 TIMES A DAY 60 g 2   simvastatin (ZOCOR) 20 MG tablet TAKE 1 TABLET(20 MG) BY MOUTH DAILY AT 6 PM 90 tablet 3   telmisartan-hydrochlorothiazide (MICARDIS HCT) 40-12.5 MG tablet TAKE 1 TABLET BY MOUTH EVERY DAY 90 tablet 1   XIIDRA 5 % SOLN Place 1 drop into both eyes 2 (two) times daily.   11   No facility-administered medications prior to visit.    Allergies  Allergen Reactions   Atorvastatin Other (See Comments)    Leg weakness   Latex Rash    ROS Review of Systems    Objective:    Physical Exam Constitutional:      Appearance: Normal appearance. She is well-developed.  HENT:     Head: Normocephalic and atraumatic.  Cardiovascular:     Rate and Rhythm: Normal rate and regular rhythm.     Heart sounds: Normal heart sounds.  Pulmonary:     Effort: Pulmonary effort is normal.     Breath sounds: Normal breath sounds.  Skin:    General: Skin is warm and dry.  Neurological:     Mental Status: She is alert and oriented to person, place, and time.  Psychiatric:        Behavior: Behavior normal.    BP 136/61 (BP Location: Left Arm)    Pulse 93    Resp 16    Ht _0  (1.626 m)    Wt 282 lb (127.9 kg)    SpO2 99%    BMI 48.41 kg/m  Wt Readings from Last 3 Encounters:  06/29/21 282 lb (127.9 kg)  04/24/21 275 lb (124.7 kg)  03/06/21 271 lb (122.9 kg)     Health Maintenance Due  Topic Date Due   MAMMOGRAM  04/30/2021    There are no preventive care reminders to display for this patient.  Lab Results  Component Value Date   TSH 2.800 12/23/2016   Lab Results  Component Value Date   WBC 5.8 10/29/2020   HGB 14.7 10/29/2020   HCT 44.0 10/29/2020   MCV 90.3 10/29/2020   PLT 315 10/29/2020   Lab Results  Component Value Date   NA 137 04/24/2021   K 4.1 04/24/2021   CO2 29 04/24/2021   GLUCOSE 111 (H) 04/24/2021  BUN 20  04/24/2021   CREATININE 0.82 04/24/2021   BILITOT 0.4 10/29/2020   ALKPHOS 65 05/08/2018   AST 17 10/29/2020   ALT 24 10/29/2020   PROT 6.9 10/29/2020   ALBUMIN 4.2 05/08/2018   CALCIUM 9.4 04/24/2021   ANIONGAP 11 05/08/2018   EGFR 85 04/24/2021   Lab Results  Component Value Date   CHOL 158 10/29/2020   Lab Results  Component Value Date   HDL 50 10/29/2020   Lab Results  Component Value Date   LDLCALC 89 10/29/2020   Lab Results  Component Value Date   TRIG 92 10/29/2020   Lab Results  Component Value Date   CHOLHDL 3.2 10/29/2020   Lab Results  Component Value Date   HGBA1C 7.1 (A) 06/29/2021      Assessment & Plan:   Problem List Items Addressed This Visit       Cardiovascular and Mediastinum   ESSENTIAL HYPERTENSION, BENIGN - Primary    Well controlled. Continue current regimen. Follow up in  3-4 mo         Endocrine   Controlled diabetes mellitus type 2 with complications (HCC)    T9N is improved significantly today.  Was previously 9.3 down to 7.1.  Doing great on 4.5 mg of Trulicity.  She has been sick over the last couple of months so has not been as active she was starting to exercise beforehand.  And has been using some cough and cold medications.  She will continue to do well so not can make any changes today we will follow-up again in 3 months      Relevant Orders   POCT HgB A1C (Completed)     Other   Severe obesity (BMI >= 40) (HCC)    Now that she is starting to feel little bit better after recent illnesses just really encouraged her to get back on track with increasing activity levels.  She was enjoying doing let's dance and taking some walks.      Other Visit Diagnoses     Screening mammogram for breast cancer       Relevant Orders   MM 3D SCREEN BREAST BILATERAL       No orders of the defined types were placed in this encounter.   Follow-up: Return in about 13 weeks (around 09/28/2021) for Diabetes follow-up.    Beatrice Lecher, MD

## 2021-06-29 NOTE — Assessment & Plan Note (Signed)
A1c is improved significantly today.  Was previously 9.3 down to 7.1.  Doing great on 4.5 mg of Trulicity.  She has been sick over the last couple of months so has not been as active she was starting to exercise beforehand.  And has been using some cough and cold medications.  She will continue to do well so not can make any changes today we will follow-up again in 3 months

## 2021-06-29 NOTE — Assessment & Plan Note (Signed)
Now that she is starting to feel little bit better after recent illnesses just really encouraged her to get back on track with increasing activity levels.  She was enjoying doing let's dance and taking some walks.

## 2021-07-04 ENCOUNTER — Other Ambulatory Visit: Payer: Self-pay | Admitting: Family Medicine

## 2021-07-12 ENCOUNTER — Other Ambulatory Visit: Payer: Self-pay | Admitting: Family Medicine

## 2021-07-12 DIAGNOSIS — E118 Type 2 diabetes mellitus with unspecified complications: Secondary | ICD-10-CM

## 2021-08-18 ENCOUNTER — Other Ambulatory Visit: Payer: Self-pay | Admitting: Family Medicine

## 2021-08-30 ENCOUNTER — Other Ambulatory Visit: Payer: Self-pay | Admitting: Family Medicine

## 2021-09-22 ENCOUNTER — Other Ambulatory Visit: Payer: Self-pay | Admitting: Family Medicine

## 2021-09-22 DIAGNOSIS — E118 Type 2 diabetes mellitus with unspecified complications: Secondary | ICD-10-CM

## 2021-09-28 ENCOUNTER — Ambulatory Visit: Payer: Managed Care, Other (non HMO) | Admitting: Family Medicine

## 2021-09-28 ENCOUNTER — Encounter: Payer: Self-pay | Admitting: Family Medicine

## 2021-09-28 VITALS — BP 131/54 | HR 91 | Ht 64.0 in | Wt 287.0 lb

## 2021-09-28 DIAGNOSIS — I10 Essential (primary) hypertension: Secondary | ICD-10-CM | POA: Diagnosis not present

## 2021-09-28 DIAGNOSIS — E118 Type 2 diabetes mellitus with unspecified complications: Secondary | ICD-10-CM

## 2021-09-28 LAB — POCT GLYCOSYLATED HEMOGLOBIN (HGB A1C): Hemoglobin A1C: 6.7 % — AB (ref 4.0–5.6)

## 2021-09-28 MED ORDER — TRULICITY 4.5 MG/0.5ML ~~LOC~~ SOAJ: 4.5000 mg | SUBCUTANEOUS | 3 refills | Status: DC

## 2021-09-28 NOTE — Assessment & Plan Note (Addendum)
Well controlled.  She is doing a great job and really working on making some small changes with her diet eating more vegetables.  Getting out in the yard and gardening so encouraged her just to continue to work at it.  She is tolerating the 1-6/1 mg of Trulicity well without any significant side effects or nausea.  Continue current regimen. Follow up in  3-4 months.  ?

## 2021-09-28 NOTE — Assessment & Plan Note (Signed)
Well controlled. Continue current regimen. Follow up in  3-4 mo  

## 2021-09-28 NOTE — Progress Notes (Signed)
? ?  Established Patient Office Visit ? ?Subjective   ?Patient ID: Laurie Hoffman, female    DOB: [birth year]  Age: 56 y.o. MRN: [id number] ? ?Chief Complaint  ?Patient presents with  ? Diabetes  ? Hypertension  ? ? ?HPI ? ?Hypertension- Pt denies chest pain, SOB, dizziness, or heart palpitations.  Taking meds as directed w/o problems.  Denies medication side effects.   ? ?Diabetes - no hypoglycemic events. No wounds or sores that are not healing well. No increased thirst or urination. Checking glucose at home. Taking medications as prescribed without any side effects. ? ? ? ?ROS ? ?  ?Objective:  ?  ? ?BP (!) 131/54   Pulse 91   Ht '5\' 4"'$  (1.626 m)   Wt 287 lb (130.2 kg)   SpO2 98%   BMI 49.26 kg/m?  ? ? ?Physical Exam ?Vitals and nursing note reviewed.  ?Constitutional:   ?   Appearance: She is well-developed.  ?HENT:  ?   Head: Normocephalic and atraumatic.  ?Cardiovascular:  ?   Rate and Rhythm: Normal rate and regular rhythm.  ?   Heart sounds: Normal heart sounds.  ?Pulmonary:  ?   Effort: Pulmonary effort is normal.  ?   Breath sounds: Normal breath sounds.  ?Skin: ?   General: Skin is warm and dry.  ?Neurological:  ?   Mental Status: She is alert and oriented to person, place, and time.  ?Psychiatric:     ?   Behavior: Behavior normal.  ? ? ? ?Results for orders placed or performed in visit on 09/28/21  ?POCT glycosylated hemoglobin (Hb A1C)  ?Result Value Ref Range  ? Hemoglobin A1C 6.7 (A) 4.0 - 5.6 %  ? HbA1c POC (<> result, manual entry)    ? HbA1c, POC (prediabetic range)    ? HbA1c, POC (controlled diabetic range)    ? ? ? ? ?The 10-year ASCVD risk score (Arnett DK, et al., 2019) is: 4.1% ? ?  ?Assessment & Plan:  ? ?Problem List Items Addressed This Visit   ? ?  ? Cardiovascular and Mediastinum  ? ESSENTIAL HYPERTENSION, BENIGN - Primary  ?  Well controlled. Continue current regimen. Follow up in  3-4 mo  ? ?  ?  ? Relevant Orders  ? Lipid Panel w/reflex Direct LDL  ? COMPLETE METABOLIC PANEL WITH GFR  ?  CBC  ? POCT glycosylated hemoglobin (Hb A1C) (Completed)  ? Urine Microalbumin w/creat. ratio  ?  ? Endocrine  ? Controlled diabetes mellitus type 2 with complications (Laurie Hoffman)  ?  Well controlled.  She is doing a great job and really working on making some small changes with her diet eating more vegetables.  Getting out in the yard and gardening so encouraged her just to continue to work at it.  She is tolerating the 8-2/4 mg of Trulicity well without any significant side effects or nausea.  Continue current regimen. Follow up in  3-4 months.  ? ?  ?  ? Relevant Medications  ? Dulaglutide (TRULICITY) 4.5 MP/5.3IR SOPN  ? Other Relevant Orders  ? Lipid Panel w/reflex Direct LDL  ? COMPLETE METABOLIC PANEL WITH GFR  ? CBC  ? POCT glycosylated hemoglobin (Hb A1C) (Completed)  ? Urine Microalbumin w/creat. ratio  ? ? ?Return in about 3 months (around 01/05/2022) for Diabetes follow-up.  ? ? ?Beatrice Lecher, MD ? ?

## 2021-10-16 ENCOUNTER — Other Ambulatory Visit: Payer: Self-pay | Admitting: Family Medicine

## 2021-10-16 DIAGNOSIS — E118 Type 2 diabetes mellitus with unspecified complications: Secondary | ICD-10-CM

## 2021-10-16 LAB — HM DIABETES EYE EXAM

## 2021-10-18 ENCOUNTER — Other Ambulatory Visit: Payer: Self-pay | Admitting: Family Medicine

## 2021-10-19 ENCOUNTER — Other Ambulatory Visit: Payer: Self-pay | Admitting: Family Medicine

## 2021-10-19 DIAGNOSIS — J3089 Other allergic rhinitis: Secondary | ICD-10-CM

## 2021-11-12 LAB — CBC
HCT: 45.1 % — ABNORMAL HIGH (ref 35.0–45.0)
Hemoglobin: 14.9 g/dL (ref 11.7–15.5)
MCH: 29.6 pg (ref 27.0–33.0)
MCHC: 33 g/dL (ref 32.0–36.0)
MCV: 89.7 fL (ref 80.0–100.0)
MPV: 9.7 fL (ref 7.5–12.5)
Platelets: 344 10*3/uL (ref 140–400)
RBC: 5.03 10*6/uL (ref 3.80–5.10)
RDW: 12.5 % (ref 11.0–15.0)
WBC: 6.4 10*3/uL (ref 3.8–10.8)

## 2021-11-12 LAB — COMPLETE METABOLIC PANEL WITH GFR
AG Ratio: 1.4 (calc) (ref 1.0–2.5)
ALT: 18 U/L (ref 6–29)
AST: 14 U/L (ref 10–35)
Albumin: 3.9 g/dL (ref 3.6–5.1)
Alkaline phosphatase (APISO): 89 U/L (ref 37–153)
BUN: 22 mg/dL (ref 7–25)
CO2: 26 mmol/L (ref 20–32)
Calcium: 9.4 mg/dL (ref 8.6–10.4)
Chloride: 101 mmol/L (ref 98–110)
Creat: 0.8 mg/dL (ref 0.50–1.03)
Globulin: 2.7 g/dL (calc) (ref 1.9–3.7)
Glucose, Bld: 123 mg/dL — ABNORMAL HIGH (ref 65–99)
Potassium: 4.7 mmol/L (ref 3.5–5.3)
Sodium: 136 mmol/L (ref 135–146)
Total Bilirubin: 0.4 mg/dL (ref 0.2–1.2)
Total Protein: 6.6 g/dL (ref 6.1–8.1)
eGFR: 88 mL/min/{1.73_m2} (ref >=60)

## 2021-11-12 LAB — LIPID PANEL W/REFLEX DIRECT LDL
Cholesterol: 190 mg/dL (ref <200)
HDL: 53 mg/dL (ref >=50)
LDL Cholesterol (Calc): 117 mg/dL (calc) — ABNORMAL HIGH
Non-HDL Cholesterol (Calc): 137 mg/dL (calc) — ABNORMAL HIGH (ref <130)
Total CHOL/HDL Ratio: 3.6 (calc) (ref <5.0)
Triglycerides: 92 mg/dL (ref <150)

## 2021-11-12 LAB — MICROALBUMIN / CREATININE URINE RATIO
Creatinine, Urine: 105 mg/dL (ref 20–275)
Microalb Creat Ratio: 4 mcg/mg creat (ref <30)
Microalb, Ur: 0.4 mg/dL

## 2021-11-12 NOTE — Progress Notes (Signed)
Hi Laurie Hoffman, cholesterol is up from last time we can discuss further when I see you at your appointment.  Also let us know if you have had a recent eye exam so we can get your chart updated.

## 2022-01-11 ENCOUNTER — Encounter: Payer: Self-pay | Admitting: Family Medicine

## 2022-01-11 ENCOUNTER — Ambulatory Visit: Payer: Managed Care, Other (non HMO) | Admitting: Family Medicine

## 2022-01-11 VITALS — BP 143/75 | HR 100 | Ht 64.0 in | Wt 293.0 lb

## 2022-01-11 DIAGNOSIS — E11319 Type 2 diabetes mellitus with unspecified diabetic retinopathy without macular edema: Secondary | ICD-10-CM | POA: Diagnosis not present

## 2022-01-11 DIAGNOSIS — T466X5A Adverse effect of antihyperlipidemic and antiarteriosclerotic drugs, initial encounter: Secondary | ICD-10-CM

## 2022-01-11 DIAGNOSIS — E118 Type 2 diabetes mellitus with unspecified complications: Secondary | ICD-10-CM

## 2022-01-11 DIAGNOSIS — I10 Essential (primary) hypertension: Secondary | ICD-10-CM | POA: Diagnosis not present

## 2022-01-11 DIAGNOSIS — Z23 Encounter for immunization: Secondary | ICD-10-CM

## 2022-01-11 DIAGNOSIS — E1169 Type 2 diabetes mellitus with other specified complication: Secondary | ICD-10-CM

## 2022-01-11 DIAGNOSIS — T50905A Adverse effect of unspecified drugs, medicaments and biological substances, initial encounter: Secondary | ICD-10-CM

## 2022-01-11 DIAGNOSIS — E785 Hyperlipidemia, unspecified: Secondary | ICD-10-CM

## 2022-01-11 DIAGNOSIS — G720 Drug-induced myopathy: Secondary | ICD-10-CM

## 2022-01-11 LAB — POCT GLYCOSYLATED HEMOGLOBIN (HGB A1C)

## 2022-01-11 LAB — POCT UA - MICROALBUMIN
Albumin/Creatinine Ratio, Urine, POC: <30
Creatinine, POC: 50 mg/dL
Microalbumin Ur, POC: 10 mg/L

## 2022-01-11 MED ORDER — PITAVASTATIN CALCIUM 1 MG PO TABS: 1.0000 mg | ORAL_TABLET | Freq: Every day | ORAL | 3 refills | Status: DC

## 2022-01-11 NOTE — Progress Notes (Signed)
Established Patient Office Visit  Subjective   Patient ID: Laurie Hoffman, female    DOB: [birth year]  Age: 56 y.o. MRN: [id number]  Chief Complaint  Patient presents with   Follow-up    HPI  Hypertension- Pt denies chest pain, SOB, dizziness, or heart palpitations.  Taking meds as directed w/o problems.  Denies medication side effects.    Diabetes - no hypoglycemic events. No wounds or sores that are not healing well. No increased thirst or urination. Checking glucose at home. Taking medications as prescribed without any side effects. On Trulicity, Tresiba and Xigduo.   She stopped her statin bc of leg cramps.  She was getting cramping about 9-10 times per night.   She stopped the medication for several weeks she said after about 2 weeks the cramping went completely away.  She even tried taking the medication again and then came back so she feels pretty confident it is a medication side effect she had similar side effects with the atorvastatin in the past as well.    ROS    Objective:     BP (!) 143/75   Pulse 100   Ht '5\' 4"'$  (1.626 m)   Wt 293 lb (132.9 kg)   SpO2 99%   BMI 50.29 kg/m    Physical Exam Vitals and nursing note reviewed.  Constitutional:      Appearance: She is well-developed.  HENT:     Head: Normocephalic and atraumatic.  Cardiovascular:     Rate and Rhythm: Normal rate and regular rhythm.     Heart sounds: Normal heart sounds.  Pulmonary:     Effort: Pulmonary effort is normal.     Breath sounds: Normal breath sounds.  Skin:    General: Skin is warm and dry.  Neurological:     Mental Status: She is alert and oriented to person, place, and time.  Psychiatric:        Behavior: Behavior normal.      Results for orders placed or performed in visit on 01/11/22  POCT HgB A1C  Result Value Ref Range   Hemoglobin A1C     HbA1c POC (<> result, manual entry)     HbA1c, POC (prediabetic range)     HbA1c, POC (controlled diabetic range)    POCT UA -  Microalbumin  Result Value Ref Range   Microalbumin Ur, POC 10 mg/L   Creatinine, POC 50 mg/dL   Albumin/Creatinine Ratio, Urine, POC <30       The 10-year ASCVD risk score (Arnett DK, et al., 2019) is: 5.7%    Assessment & Plan:   Problem List Items Addressed This Visit       Cardiovascular and Mediastinum   ESSENTIAL HYPERTENSION, BENIGN   Relevant Medications   Pitavastatin Calcium 1 MG TABS     Endocrine   Hyperlipidemia associated with type 2 diabetes mellitus ([place])    Last lipid levels in June not at goal.  Discussed trying to increase simvastatin to 40 mg.  Intolerant to atorvastatin or consider switching to Crestor or Livalo.      Relevant Medications   Pitavastatin Calcium 1 MG TABS   Diabetic retinopathy ([place])    To get up-to-date eye exam.      Relevant Medications   Pitavastatin Calcium 1 MG TABS   Controlled diabetes mellitus type 2 with complications ([place]) - Primary    A1c went back up to 7.3.  She admits she has been getting into a little bit more carbs  recently more so over the last months she was doing really well up until then.  And she also has dropped off the exercise though she did get a walk-in last week.  So just really encouraged her to get back on track with diet and exercise as her last A1c was under 7.  She is on Tresiba 38 units.  Encouraged her to check with her insurance to see if they will pay for [facility] since it is a little bit more powerful than the Trulicity.      Relevant Medications   Pitavastatin Calcium 1 MG TABS   Other Relevant Orders   POCT HgB A1C (Completed)   POCT UA - Microalbumin (Completed)     Musculoskeletal and Integument   Statin myopathy    We discussed options including alternating days or decreasing the number of days on the statin versus trying a different statin we will do a trial of Pitavastatin 1 mg if not able to tolerate then okay to decrease down to twice a week.  Added simvastatin to intolerance list.       Relevant Medications   Pitavastatin Calcium 1 MG TABS   Other Visit Diagnoses     Medication side effect, initial encounter       Relevant Medications   Pitavastatin Calcium 1 MG TABS       Encouraged to schedule her mammogram.  Return in about 3 months (around 04/13/2022) for Diabetes follow-up, Hypertension.    Beatrice Lecher, MD

## 2022-01-11 NOTE — Assessment & Plan Note (Signed)
We discussed options including alternating days or decreasing the number of days on the statin versus trying a different statin we will do a trial of Pitavastatin 1 mg if not able to tolerate then okay to decrease down to twice a week.  Added simvastatin to intolerance list.

## 2022-01-11 NOTE — Assessment & Plan Note (Signed)
To get up-to-date eye exam.

## 2022-01-11 NOTE — Assessment & Plan Note (Signed)
Last lipid levels in June not at goal.  Discussed trying to increase simvastatin to 40 mg.  Intolerant to atorvastatin or consider switching to Crestor or Livalo.

## 2022-01-11 NOTE — Assessment & Plan Note (Signed)
A1c went back up to 7.3.  She admits she has been getting into a little bit more carbs recently more so over the last months she was doing really well up until then.  And she also has dropped off the exercise though she did get a walk-in last week.  So just really encouraged her to get back on track with diet and exercise as her last A1c was under 7.  She is on Tresiba 38 units.  Encouraged her to check with her insurance to see if they will pay for [facility] since it is a little bit more powerful than the Trulicity.

## 2022-01-16 ENCOUNTER — Other Ambulatory Visit: Payer: Self-pay | Admitting: Family Medicine

## 2022-04-13 ENCOUNTER — Ambulatory Visit: Payer: Managed Care, Other (non HMO) | Admitting: Family Medicine

## 2022-04-13 ENCOUNTER — Encounter: Payer: Self-pay | Admitting: Family Medicine

## 2022-04-13 VITALS — BP 130/58 | HR 87 | Ht 64.0 in | Wt 295.0 lb

## 2022-04-13 DIAGNOSIS — E118 Type 2 diabetes mellitus with unspecified complications: Secondary | ICD-10-CM | POA: Diagnosis not present

## 2022-04-13 DIAGNOSIS — I10 Essential (primary) hypertension: Secondary | ICD-10-CM | POA: Diagnosis not present

## 2022-04-13 DIAGNOSIS — E1169 Type 2 diabetes mellitus with other specified complication: Secondary | ICD-10-CM | POA: Diagnosis not present

## 2022-04-13 DIAGNOSIS — Z23 Encounter for immunization: Secondary | ICD-10-CM | POA: Diagnosis not present

## 2022-04-13 DIAGNOSIS — E785 Hyperlipidemia, unspecified: Secondary | ICD-10-CM

## 2022-04-13 LAB — POCT GLYCOSYLATED HEMOGLOBIN (HGB A1C): Hemoglobin A1C: 7 % — AB (ref 4.0–5.6)

## 2022-04-13 MED ORDER — TRESIBA FLEXTOUCH 100 UNIT/ML ~~LOC~~ SOPN: 40.0000 [IU] | PEN_INJECTOR | Freq: Every day | SUBCUTANEOUS | 3 refills | Status: DC

## 2022-04-13 NOTE — Assessment & Plan Note (Signed)
She is going to try decreasing her statin to every other night and see if she is able to tolerate that without any cramping.  If she still gets recurrent cramping we discussed decreasing down to 3 days/week.  Continue to work on diet and exercise.

## 2022-04-13 NOTE — Patient Instructions (Signed)
Please increase the Tresiba to 40 units daily. Continue with your Trulicity weekly. Continue with Xigduo.

## 2022-04-13 NOTE — Assessment & Plan Note (Signed)
BP borderline.  We will continue to monitor.  She has made some great progress.

## 2022-04-13 NOTE — Assessment & Plan Note (Signed)
A1c 7.0 today.  Continue to work on healthy food choices and regular exercise she is really made some great changes.  She is back up to about 6000 steps per day but wants to get back up to 8000.  She had a knee injury that caused her to have to back off on working out for a little while.  Increase Tresiba to 40 units daily.

## 2022-04-13 NOTE — Progress Notes (Addendum)
Established Patient Office Visit  Subjective   Patient ID: Laurie Hoffman, female    DOB: [birth year]  Age: 56 y.o. MRN: [id number]  Chief Complaint  Patient presents with   Diabetes    HPI  Diabetes - no hypoglycemic events. No wounds or sores that are not healing well. No increased thirst or urination. Checking glucose at home. Taking medications as prescribed without any side effects. Eye exam scheduled.  36 unit sof [place].    Hypertension- Pt denies chest pain, SOB, dizziness, or heart palpitations.  Taking meds as directed w/o problems.  Denies medication side effects.    Stopped her statin about a week ago but plans to restart  it every other day.  Taking pravastatin.   She is also scheduled her mammogram.    ROS    Objective:     BP (!) 130/58   Pulse 87   Ht '5\' 4"'$  (1.626 m)   Wt 295 lb (133.8 kg)   SpO2 98%   BMI 50.64 kg/m    Physical Exam Vitals and nursing note reviewed.  Constitutional:      Appearance: She is well-developed.  HENT:     Head: Normocephalic and atraumatic.  Cardiovascular:     Rate and Rhythm: Normal rate and regular rhythm.     Heart sounds: Normal heart sounds.  Pulmonary:     Effort: Pulmonary effort is normal.     Breath sounds: Normal breath sounds.  Skin:    General: Skin is warm and dry.  Neurological:     Mental Status: She is alert and oriented to person, place, and time.  Psychiatric:        Behavior: Behavior normal.     Results for orders placed or performed in visit on 04/13/22  POCT glycosylated hemoglobin (Hb A1C)  Result Value Ref Range   Hemoglobin A1C 7.0 (A) 4.0 - 5.6 %   HbA1c POC (<> result, manual entry)     HbA1c, POC (prediabetic range)     HbA1c, POC (controlled diabetic range)        The 10-year ASCVD risk score (Arnett DK, et al., 2019) is: 4.7%    Assessment & Plan:   Problem List Items Addressed This Visit       Cardiovascular and Mediastinum   Essential hypertension, benign -  Primary    BP borderline.  We will continue to monitor.  She has made some great progress.        Endocrine   Hyperlipidemia associated with type 2 diabetes mellitus ([place])    She is going to try decreasing her statin to every other night and see if she is able to tolerate that without any cramping.  If she still gets recurrent cramping we discussed decreasing down to 3 days/week.  Continue to work on diet and exercise.      Relevant Medications   insulin degludec (TRESIBA FLEXTOUCH) 100 UNIT/ML FlexTouch Pen   Controlled diabetes mellitus type 2 with complications (HCC)    U7O 7.0 today.  Continue to work on healthy food choices and regular exercise she is really made some great changes.  She is back up to about 6000 steps per day but wants to get back up to 8000.  She had a knee injury that caused her to have to back off on working out for a little while.  Increase Tresiba to 40 units daily.      Relevant Medications   insulin degludec (TRESIBA FLEXTOUCH) 100 UNIT/ML FlexTouch  Pen   Other Relevant Orders   POCT glycosylated hemoglobin (Hb A1C) (Completed)   Other Visit Diagnoses     Encounter for immunization       Relevant Orders   [employer] Fall 2023 Covid-19 Vaccine 80yr and older (Completed)       Return in about 3 months (around 07/19/2022) for Diabetes follow-up.    CBeatrice Lecher MD

## 2022-04-16 ENCOUNTER — Other Ambulatory Visit: Payer: Self-pay | Admitting: Family Medicine

## 2022-06-01 ENCOUNTER — Other Ambulatory Visit: Payer: Self-pay | Admitting: Family Medicine

## 2022-06-01 DIAGNOSIS — E118 Type 2 diabetes mellitus with unspecified complications: Secondary | ICD-10-CM

## 2022-06-02 ENCOUNTER — Encounter: Payer: Self-pay | Admitting: Family Medicine

## 2022-07-19 ENCOUNTER — Ambulatory Visit: Payer: Managed Care, Other (non HMO) | Admitting: Family Medicine

## 2022-07-19 ENCOUNTER — Encounter: Payer: Self-pay | Admitting: Family Medicine

## 2022-07-19 ENCOUNTER — Other Ambulatory Visit: Payer: Self-pay | Admitting: Family Medicine

## 2022-07-19 VITALS — BP 150/75 | HR 98 | Ht 64.0 in | Wt 287.1 lb

## 2022-07-19 DIAGNOSIS — Z1231 Encounter for screening mammogram for malignant neoplasm of breast: Secondary | ICD-10-CM | POA: Diagnosis not present

## 2022-07-19 DIAGNOSIS — E118 Type 2 diabetes mellitus with unspecified complications: Secondary | ICD-10-CM

## 2022-07-19 DIAGNOSIS — I10 Essential (primary) hypertension: Secondary | ICD-10-CM

## 2022-07-19 LAB — POCT GLYCOSYLATED HEMOGLOBIN (HGB A1C): Hemoglobin A1C: 7.2 % — AB (ref 4.0–5.6)

## 2022-07-19 MED ORDER — DAPAGLIFLOZIN PRO-METFORMIN ER 5-1000 MG PO TB24: 2.0000 | ORAL_TABLET | Freq: Every day | ORAL | 1 refills | Status: DC

## 2022-07-19 MED ORDER — TIRZEPATIDE 10 MG/0.5ML ~~LOC~~ SOAJ: 10.0000 mg | SUBCUTANEOUS | 0 refills | Status: DC

## 2022-07-19 MED ORDER — TIRZEPATIDE 5 MG/0.5ML ~~LOC~~ SOAJ: 5.0000 mg | SUBCUTANEOUS | 0 refills | Status: DC

## 2022-07-19 MED ORDER — TELMISARTAN-HCTZ 40-12.5 MG PO TABS: 1.0000 | ORAL_TABLET | Freq: Every day | ORAL | 3 refills | Status: DC

## 2022-07-19 MED ORDER — TIRZEPATIDE 2.5 MG/0.5ML ~~LOC~~ SOAJ: 2.5000 mg | SUBCUTANEOUS | 0 refills | Status: DC

## 2022-07-19 NOTE — Assessment & Plan Note (Signed)
Encouraged her to continue to work on healthy diet and exercise.

## 2022-07-19 NOTE — Progress Notes (Signed)
Established Patient Office Visit  Subjective   Patient ID: Laurie Hoffman, female    DOB: June 06, 1965  Age: 57 y.o. MRN: [id number]  Chief Complaint  Patient presents with   Follow-up    3 month f/u    HPI  Hypertension- Pt denies chest pain, SOB, dizziness, or heart palpitations.  Taking meds as directed w/o problems.  Denies medication side effects.    Diabetes - no hypoglycemic events. No wounds or sores that are not healing well. No increased thirst or urination. Checking glucose at home. Taking medications as prescribed without any side effects. Has been craving carbs and has been stressed eating.    Husband Eddie Dibbles just had biopsies for possible oral cancer and MRI for spot on his spine.       ROS    Objective:     BP (!) 150/75 (BP Location: Right Arm, Patient Position: Sitting, Cuff Size: Large)   Pulse 98   Ht '5\' 4"'$  (1.626 m)   Wt 287 lb 1.3 oz (130.2 kg)   SpO2 98%   BMI 49.28 kg/m    Physical Exam Vitals and nursing note reviewed.  Constitutional:      Appearance: She is well-developed.  HENT:     Head: Normocephalic and atraumatic.  Cardiovascular:     Rate and Rhythm: Normal rate and regular rhythm.     Heart sounds: Normal heart sounds.  Pulmonary:     Effort: Pulmonary effort is normal.     Breath sounds: Normal breath sounds.  Skin:    General: Skin is warm and dry.  Neurological:     Mental Status: She is alert and oriented to person, place, and time.  Psychiatric:        Behavior: Behavior normal.     Results for orders placed or performed in visit on 07/19/22  POCT glycosylated hemoglobin (Hb A1C)  Result Value Ref Range   Hemoglobin A1C 7.2 (A) 4.0 - 5.6 %   HbA1c POC (<> result, manual entry)     HbA1c, POC (prediabetic range)     HbA1c, POC (controlled diabetic range)        The 10-year ASCVD risk score (Arnett DK, et al., 2019) is: 6.2%    Assessment & Plan:   Problem List Items Addressed This Visit       Cardiovascular  and Mediastinum   Essential hypertension, benign    Bp elevated today. Plan to recheck in 2 weeks with nurse visit.       Relevant Medications   telmisartan-hydrochlorothiazide (MICARDIS HCT) 40-12.5 MG tablet   Other Relevant Orders   BASIC METABOLIC PANEL WITH GFR     Endocrine   Controlled diabetes mellitus type 2 with complications ([place]) - Primary    Uncontrolled. A1C went up to 7.2.  discussed working on diet. Will see if insurance will cover Mounjaro to get better efficacy than the trulicity for better [id number] reduction and for better diet control. .        Relevant Medications   telmisartan-hydrochlorothiazide (MICARDIS HCT) 40-12.5 MG tablet   Dapagliflozin Pro-metFORMIN ER (XIGDUO XR) 09-998 MG TB24   tirzepatide (MOUNJARO) 2.5 MG/0.5ML Pen   tirzepatide [facility]) 5 MG/0.5ML Pen (Start on 08/16/2022)   tirzepatide [facility]) 10 MG/0.5ML Pen (Start on 09/15/2022)   Other Relevant Orders   POCT glycosylated hemoglobin (Hb A1C) (Completed)   BASIC METABOLIC PANEL WITH GFR     Other   Severe obesity (BMI >= 40) (HCC)    Encouraged her  to continue to work on healthy diet and exercise.       Relevant Medications   Dapagliflozin Pro-metFORMIN ER (XIGDUO XR) 09-998 MG TB24   tirzepatide (MOUNJARO) 2.5 MG/0.5ML Pen   tirzepatide [facility]) 5 MG/0.5ML Pen (Start on 08/16/2022)   tirzepatide [facility]) 10 MG/0.5ML Pen (Start on 09/15/2022)   Other Visit Diagnoses     Screening mammogram for breast cancer       Relevant Orders   MM 3D SCREEN BREAST BILATERAL      Encouraged her to schedule her mammogram online.    Return in about 3 months (around 10/19/2022) for Diabetes follow-up.    Beatrice Lecher, MD

## 2022-07-19 NOTE — Assessment & Plan Note (Signed)
Bp elevated today. Plan to recheck in 2 weeks with nurse visit.

## 2022-07-19 NOTE — Assessment & Plan Note (Addendum)
Uncontrolled. A1C went up to 7.2.  discussed working on diet. Will see if insurance will cover Mounjaro to get better efficacy than the trulicity for better 123XX123 reduction and for better diet control. Marland Kitchen

## 2022-07-21 LAB — BASIC METABOLIC PANEL WITH GFR
BUN: 19 mg/dL (ref 7–25)
CO2: 32 mmol/L (ref 20–32)
Calcium: 10.3 mg/dL (ref 8.6–10.4)
Chloride: 96 mmol/L — ABNORMAL LOW (ref 98–110)
Creat: 0.83 mg/dL (ref 0.50–1.03)
Glucose, Bld: 166 mg/dL — ABNORMAL HIGH (ref 65–99)
Potassium: 4.6 mmol/L (ref 3.5–5.3)
Sodium: 138 mmol/L (ref 135–146)
eGFR: 84 mL/min/{1.73_m2} (ref >=60)

## 2022-07-22 NOTE — Progress Notes (Signed)
Your lab work is within acceptable range and there are no concerning findings.   ?

## 2022-08-02 ENCOUNTER — Ambulatory Visit (INDEPENDENT_AMBULATORY_CARE_PROVIDER_SITE_OTHER): Payer: Managed Care, Other (non HMO) | Admitting: Physician Assistant

## 2022-08-02 VITALS — BP 136/75 | HR 97 | Ht 64.0 in

## 2022-08-02 DIAGNOSIS — I10 Essential (primary) hypertension: Secondary | ICD-10-CM | POA: Diagnosis not present

## 2022-08-02 NOTE — Patient Instructions (Signed)
continue current medication regimen. Keep upcoming appt with Dr. Madilyn Fireman for 10/19/2022.

## 2022-08-02 NOTE — Progress Notes (Signed)
Agree with below plan. BP much better. Keep regular follow ups with Pc[facility].

## 2022-08-02 NOTE — Progress Notes (Signed)
   Established Patient Office Visit  Subjective   Patient ID: Laurie Hoffman, female    DOB: 1965/07/30  Age: 57 y.o. MRN: [id number]  Chief Complaint  Patient presents with   Hypertension    BP check - nurse visit.    HPI  Hypertension- BP check- nurse visiti-  patient denies chest pain, shortness of breath, dizziness or medication problems.   ROS    Objective:     BP 136/75   Pulse 97   Ht 5\' 4"  (1.626 m)   SpO2 100%   BMI 49.28 kg/m    Physical Exam   No results found for any visits on 08/02/22.    The 10-year ASCVD risk score (Arnett DK, et al., 2019) is: 5.1%    Assessment & Plan:  BP check - nurse visit - initial reading 136/81.second reading= 136/75.continue current medication regimen. Keep upcoming appt with Dr. Madilyn Fireman for 10/19/2022. Problem List Items Addressed This Visit       Cardiovascular and Mediastinum   Essential hypertension, benign - Primary    Return for Keep upcoming appt with Dr. Madilyn Fireman scheduled for 10/19/2022. Rae Lips, LPN

## 2022-08-14 ENCOUNTER — Other Ambulatory Visit: Payer: Self-pay | Admitting: Family Medicine

## 2022-08-14 DIAGNOSIS — E118 Type 2 diabetes mellitus with unspecified complications: Secondary | ICD-10-CM

## 2022-09-17 ENCOUNTER — Telehealth: Payer: Self-pay | Admitting: Family Medicine

## 2022-09-17 NOTE — Telephone Encounter (Signed)
Attempted call to patient. Left voice mail message requesting a return call.  

## 2022-09-17 NOTE — Telephone Encounter (Signed)
Pls call pt and remind her to schedule her mammogram. Can do over MyChart

## 2022-09-20 NOTE — Telephone Encounter (Signed)
Task completed. Patient has an upcoming appointment to complete her mammo on 09/29/22.

## 2022-09-21 ENCOUNTER — Other Ambulatory Visit: Payer: Self-pay | Admitting: Family Medicine

## 2022-09-21 DIAGNOSIS — Z1231 Encounter for screening mammogram for malignant neoplasm of breast: Secondary | ICD-10-CM

## 2022-09-29 ENCOUNTER — Ambulatory Visit: Payer: Managed Care, Other (non HMO)

## 2022-09-29 DIAGNOSIS — Z1231 Encounter for screening mammogram for malignant neoplasm of breast: Secondary | ICD-10-CM | POA: Diagnosis not present

## 2022-10-01 NOTE — Progress Notes (Signed)
Please call patient. Normal mammogram.  Repeat in 1 year.  

## 2022-10-09 ENCOUNTER — Other Ambulatory Visit: Payer: Self-pay | Admitting: Family Medicine

## 2022-10-09 DIAGNOSIS — J3089 Other allergic rhinitis: Secondary | ICD-10-CM

## 2022-10-19 ENCOUNTER — Ambulatory Visit: Payer: Managed Care, Other (non HMO) | Admitting: Family Medicine

## 2022-10-19 VITALS — BP 128/64 | HR 86 | Ht 64.0 in | Wt 282.0 lb

## 2022-10-19 DIAGNOSIS — Z7984 Long term (current) use of oral hypoglycemic drugs: Secondary | ICD-10-CM

## 2022-10-19 DIAGNOSIS — E785 Hyperlipidemia, unspecified: Secondary | ICD-10-CM

## 2022-10-19 DIAGNOSIS — E1169 Type 2 diabetes mellitus with other specified complication: Secondary | ICD-10-CM

## 2022-10-19 DIAGNOSIS — I10 Essential (primary) hypertension: Secondary | ICD-10-CM

## 2022-10-19 DIAGNOSIS — E11319 Type 2 diabetes mellitus with unspecified diabetic retinopathy without macular edema: Secondary | ICD-10-CM

## 2022-10-19 DIAGNOSIS — E118 Type 2 diabetes mellitus with unspecified complications: Secondary | ICD-10-CM

## 2022-10-19 LAB — POCT GLYCOSYLATED HEMOGLOBIN (HGB A1C): Hemoglobin A1C: 7.2 % — AB (ref 4.0–5.6)

## 2022-10-19 NOTE — Assessment & Plan Note (Signed)
A1C 7.2, still not at goal. She has worked hard on some dietary changes.  Continue working with health coach to make changes and really listening to her body as far as being satisfied with foods.  Continue to work on increasing activity and exercise.  She will try to go up to the [facility] 10 mg in about 2 weeks as long as the pharmacy can get it in.  Encouraged her to give them a call now so that they can start working on getting the prescription in for her.

## 2022-10-19 NOTE — Assessment & Plan Note (Signed)
BP borderline today. Will recheck before she leaves.

## 2022-10-19 NOTE — Assessment & Plan Note (Signed)
That she just had lipids done through her work and should be getting a copy of those later this week and will get those uploaded to [place].  She says her numbers actually looked really good.

## 2022-10-19 NOTE — Progress Notes (Signed)
Established Patient Office Visit  Subjective   Patient ID: Laurie Hoffman, female    DOB: [birth year]  Age: 57 y.o. MRN: [id number]  Chief Complaint  Patient presents with   Diabetes   Hypertension    HPI  Hypertension- Pt denies chest pain, SOB, dizziness, or heart palpitations.  Taking meds as directed w/o problems.  Denies medication side effects.    Diabetes - no hypoglycemic events. No wounds or sores that are not healing well. No increased thirst or urination. Checking glucose at home. Taking medications as prescribed without any side effects.  Walking between 6000-7000 steps per day. Working with a Control and instrumentation engineer.     ROS    Objective:     BP 128/64   Pulse 86   Ht 5\' 4"  (1.626 m)   Wt 282 lb (127.9 kg)   SpO2 98%   BMI 48.41 kg/m    Physical Exam Vitals and nursing note reviewed.  Constitutional:      Appearance: She is well-developed.  HENT:     Head: Normocephalic and atraumatic.  Cardiovascular:     Rate and Rhythm: Normal rate and regular rhythm.     Heart sounds: Normal heart sounds.  Pulmonary:     Effort: Pulmonary effort is normal.     Breath sounds: Normal breath sounds.  Skin:    General: Skin is warm and dry.  Neurological:     Mental Status: She is alert and oriented to person, place, and time.  Psychiatric:        Behavior: Behavior normal.      Results for orders placed or performed in visit on 10/19/22  POCT glycosylated hemoglobin (Hb A1C)  Result Value Ref Range   Hemoglobin A1C 7.2 (A) 4.0 - 5.6 %   HbA1c POC (<> result, manual entry)     HbA1c, POC (prediabetic range)     HbA1c, POC (controlled diabetic range)        The 10-year ASCVD risk score (Arnett DK, et al., 2019) is: 5%    Assessment & Plan:   Problem List Items Addressed This Visit       Cardiovascular and Mediastinum   Essential hypertension, benign - Primary    BP borderline today. Will recheck before she leaves.         Endocrine   Hyperlipidemia  associated with type 2 diabetes mellitus (HCC)    That she just had lipids done through her work and should be getting a copy of those later this week and will get those uploaded to [place].  She says her numbers actually looked really good.      Diabetic retinopathy [facility])    Eye exam scheduled for this Friday.  Hopefully they will send [place] a copy.      Controlled diabetes mellitus type 2 with complications (HCC)    A1C 7.2, still not at goal. She has worked hard on some dietary changes.  Continue working with health coach to make changes and really listening to her body as far as being satisfied with foods.  Continue to work on increasing activity and exercise.  She will try to go up to the [facility] 10 mg in about 2 weeks as long as the pharmacy can get it in.  Encouraged her to give them a call now so that they can start working on getting the prescription in for her.      Relevant Orders   POCT glycosylated hemoglobin (Hb A1C) (Completed)    Return  in about 3 months (around 01/19/2023) for Diabetes follow-up.    Nani Gasser, MD

## 2022-10-19 NOTE — Assessment & Plan Note (Signed)
Eye exam scheduled for this Friday.  Hopefully they will send [place] a copy.

## 2022-10-22 LAB — HM DIABETES EYE EXAM

## 2022-10-30 ENCOUNTER — Other Ambulatory Visit: Payer: Self-pay | Admitting: Family Medicine

## 2022-10-30 DIAGNOSIS — E118 Type 2 diabetes mellitus with unspecified complications: Secondary | ICD-10-CM

## 2022-11-01 ENCOUNTER — Encounter: Payer: Self-pay | Admitting: Family Medicine

## 2022-11-01 DIAGNOSIS — E118 Type 2 diabetes mellitus with unspecified complications: Secondary | ICD-10-CM

## 2022-11-01 MED ORDER — TIRZEPATIDE 10 MG/0.5ML ~~LOC~~ SOAJ: 10.0000 mg | SUBCUTANEOUS | 1 refills | Status: DC

## 2022-11-02 ENCOUNTER — Encounter: Payer: Self-pay | Admitting: Family Medicine

## 2023-01-18 ENCOUNTER — Other Ambulatory Visit: Payer: Self-pay | Admitting: Family Medicine

## 2023-01-18 DIAGNOSIS — E118 Type 2 diabetes mellitus with unspecified complications: Secondary | ICD-10-CM

## 2023-01-19 ENCOUNTER — Ambulatory Visit: Payer: Managed Care, Other (non HMO) | Admitting: Family Medicine

## 2023-01-27 ENCOUNTER — Ambulatory Visit: Payer: Managed Care, Other (non HMO) | Admitting: Family Medicine

## 2023-01-27 ENCOUNTER — Encounter: Payer: Self-pay | Admitting: Family Medicine

## 2023-01-27 VITALS — BP 125/52 | HR 87 | Ht 64.0 in | Wt 262.0 lb

## 2023-01-27 DIAGNOSIS — E11319 Type 2 diabetes mellitus with unspecified diabetic retinopathy without macular edema: Secondary | ICD-10-CM | POA: Diagnosis not present

## 2023-01-27 DIAGNOSIS — Z23 Encounter for immunization: Secondary | ICD-10-CM | POA: Diagnosis not present

## 2023-01-27 DIAGNOSIS — E118 Type 2 diabetes mellitus with unspecified complications: Secondary | ICD-10-CM | POA: Diagnosis not present

## 2023-01-27 DIAGNOSIS — I10 Essential (primary) hypertension: Secondary | ICD-10-CM

## 2023-01-27 DIAGNOSIS — Z1211 Encounter for screening for malignant neoplasm of colon: Secondary | ICD-10-CM

## 2023-01-27 DIAGNOSIS — Z7984 Long term (current) use of oral hypoglycemic drugs: Secondary | ICD-10-CM

## 2023-01-27 LAB — POCT GLYCOSYLATED HEMOGLOBIN (HGB A1C): Hemoglobin A1C: 6.8 % — AB (ref 4.0–5.6)

## 2023-01-27 LAB — POCT UA - MICROALBUMIN
Albumin/Creatinine Ratio, Urine, POC: <30
Creatinine, POC: 10 mg/dL
Microalbumin Ur, POC: 10 mg/L

## 2023-01-27 MED ORDER — TIRZEPATIDE 12.5 MG/0.5ML ~~LOC~~ SOAJ
12.5000 mg | SUBCUTANEOUS | 0 refills | Status: DC
Start: 2023-01-27 — End: 2023-05-05

## 2023-01-27 NOTE — Assessment & Plan Note (Addendum)
Well controlled. Continue current regimen. Follow up in  6 months.  Did discuss that she continues to lose weight if she starting to notice that she is feeling lightheaded or dizzy to please let us know as it might be an indication to adjust her blood pressure pill down.

## 2023-01-27 NOTE — Assessment & Plan Note (Signed)
He has done a fantastic job and she is really truly committed to some lifestyle changes.  BMI is now down to 44 she is down about 20 pounds.

## 2023-01-27 NOTE — Assessment & Plan Note (Signed)
1C improved and looks great today she has been tolerating the [facility] well we discussed going up on her dose and really like to get her well-controlled and maximized on that medication and then hopefully be able to either decrease or stop her Xigduo.

## 2023-01-27 NOTE — Progress Notes (Signed)
Established Patient Office Visit  Subjective   Patient ID: Laurie Hoffman, female    DOB: 1966-02-14  Age: 57 y.o. MRN: [id number]  Chief Complaint  Patient presents with   Diabetes    HPI  Diabetes - no hypoglycemic events. No wounds or sores that are not healing well. No increased thirst or urination. Checking glucose at home. Taking medications as prescribed without any side effects.  She is down 20 pounds from when I last saw her she is really made a concerted effort to move more she has been able to get a sit/stand desk.  She is walking about 6500 steps per day.  Shooting for 7000/day most times.  Hypertension- Pt denies chest pain, SOB, dizziness, or heart palpitations.  Taking meds as directed w/o problems.  Denies medication side effects.       ROS    Objective:     BP (!) 125/52   Pulse 87   Ht 5\' 4"  (1.626 m)   Wt 262 lb (118.8 kg)   SpO2 100%   BMI 44.97 kg/m    Physical Exam Vitals and nursing note reviewed.  Constitutional:      Appearance: Normal appearance.  HENT:     Head: Normocephalic and atraumatic.     Right Ear: There is no impacted cerumen.     Left Ear: There is no impacted cerumen.     Nose: Nose normal.     Mouth/Throat:     Pharynx: Oropharynx is clear.  Eyes:     Conjunctiva/sclera: Conjunctivae normal.  Cardiovascular:     Rate and Rhythm: Normal rate and regular rhythm.  Pulmonary:     Effort: Pulmonary effort is normal.     Breath sounds: Normal breath sounds.  Skin:    General: Skin is warm and dry.  Neurological:     Mental Status: She is alert and oriented to person, place, and time.  Psychiatric:        Mood and Affect: Mood normal.     Results for orders placed or performed in visit on 01/27/23  POCT Urine microalbumin-creatinine with uACR  Result Value Ref Range   Microalbumin Ur, POC 10 mg/L   Creatinine, POC 10 mg/dL   Albumin/Creatinine Ratio, Urine, POC <30   POCT HgB A1C  Result Value Ref Range   Hemoglobin  A1C 6.8 (A) 4.0 - 5.6 %   HbA1c POC (<> result, manual entry)     HbA1c, POC (prediabetic range)     HbA1c, POC (controlled diabetic range)        The 10-year ASCVD risk score (Arnett DK, et al., 2019) is: 4.8%    Assessment & Plan:   Problem List Items Addressed This Visit       Cardiovascular and Mediastinum   Essential hypertension, benign - Primary    Well controlled. Continue current regimen. Follow up in  6 months.  Did discuss that she continues to lose weight if she starting to notice that she is feeling lightheaded or dizzy to please let us know as it might be an indication to adjust her blood pressure pill down.      Relevant Orders   POCT Urine microalbumin-creatinine with uACR (Completed)   Lipid Panel w/reflex Direct LDL   COMPLETE METABOLIC PANEL WITH GFR   CMP14+EGFR   Lipid panel   CBC     Endocrine   Diabetic retinopathy (HCC)    Getting yearly eye exam.      Relevant Medications  tirzepatide [facility]) 12.5 MG/0.5ML Pen   Controlled diabetes mellitus type 2 with complications (HCC)    1C improved and looks great today she has been tolerating the [facility] well we discussed going up on her dose and really like to get her well-controlled and maximized on that medication and then hopefully be able to either decrease or stop her Xigduo.      Relevant Medications   tirzepatide (MOUNJARO) 12.5 MG/0.5ML Pen   Other Relevant Orders   POCT Urine microalbumin-creatinine with uACR (Completed)   POCT HgB A1C (Completed)   Lipid Panel w/reflex Direct LDL   COMPLETE METABOLIC PANEL WITH GFR   CMP14+EGFR   Lipid panel   CBC     Other   Severe obesity (BMI >= 40) (HCC)    He has done a fantastic job and she is really truly committed to some lifestyle changes.  BMI is now down to 44 she is down about 20 pounds.      Relevant Medications   tirzepatide (MOUNJARO) 12.5 MG/0.5ML Pen   Other Visit Diagnoses     Screen for colon cancer       Relevant Orders    Cologuard   Lipid Panel w/reflex Direct LDL   COMPLETE METABOLIC PANEL WITH GFR   Encounter for immunization       Relevant Orders   Flu vaccine trivalent PF, 6mos and older(Flulaval,Afluria,Fluarix,Fluzone) (Completed)   Encounter for immunization       Relevant Orders   Pfizer Comirnaty Covid-19 Vaccine 28yrs & older (Completed)      Diabetic Foot Exam - Simple   Simple Foot Form Diabetic Foot exam was performed with the following findings: Yes 01/27/2023  8:44 AM  Visual Inspection No deformities, no ulcerations, no other skin breakdown bilaterally: Yes Sensation Testing Intact to touch and monofilament testing bilaterally: Yes Pulse Check Posterior Tibialis and Dorsalis pulse intact bilaterally: Yes Comments      Return in about 14 weeks (around 05/05/2023) for Diabetes follow-up.    Nani Gasser, MD

## 2023-01-27 NOTE — Assessment & Plan Note (Signed)
Getting yearly eye exam.

## 2023-01-28 LAB — LIPID PANEL
Chol/HDL Ratio: 4.3 ratio (ref 0.0–4.4)
Cholesterol, Total: 193 mg/dL (ref 100–199)
HDL: 45 mg/dL (ref >39)
LDL Chol Calc (NIH): 129 mg/dL — ABNORMAL HIGH (ref 0–99)
Triglycerides: 102 mg/dL (ref 0–149)
VLDL Cholesterol Cal: 19 mg/dL (ref 5–40)

## 2023-01-28 LAB — CBC
Hematocrit: 47.5 % — ABNORMAL HIGH (ref 34.0–46.6)
Hemoglobin: 15.1 g/dL (ref 11.1–15.9)
MCH: 29 pg (ref 26.6–33.0)
MCHC: 31.8 g/dL (ref 31.5–35.7)
MCV: 91 fL (ref 79–97)
Platelets: 271 10*3/uL (ref 150–450)
RBC: 5.2 x10E6/uL (ref 3.77–5.28)
RDW: 12.5 % (ref 11.7–15.4)
WBC: 5.6 10*3/uL (ref 3.4–10.8)

## 2023-01-28 LAB — CMP14+EGFR
ALT: 27 IU/L (ref 0–32)
AST: 21 IU/L (ref 0–40)
Albumin: 4.2 g/dL (ref 3.8–4.9)
Alkaline Phosphatase: 89 IU/L (ref 44–121)
BUN/Creatinine Ratio: 20 (ref 9–23)
BUN: 16 mg/dL (ref 6–24)
Bilirubin Total: 0.4 mg/dL (ref 0.0–1.2)
CO2: 23 mmol/L (ref 20–29)
Calcium: 9.4 mg/dL (ref 8.7–10.2)
Chloride: 99 mmol/L (ref 96–106)
Creatinine, Ser: 0.81 mg/dL (ref 0.57–1.00)
Globulin, Total: 2.7 g/dL (ref 1.5–4.5)
Glucose: 123 mg/dL — ABNORMAL HIGH (ref 70–99)
Potassium: 4.5 mmol/L (ref 3.5–5.2)
Sodium: 136 mmol/L (ref 134–144)
Total Protein: 6.9 g/dL (ref 6.0–8.5)
eGFR: 86 mL/min/{1.73_m2} (ref >59)

## 2023-01-28 NOTE — Progress Notes (Signed)
Hi Remas, glucose was a little elevated at 123 but liver and kidney function are stable.  LDL cholesterol is also elevated at 129 goal is less than 100.  Doing a great job just continue to work on [employer] and really increasing her activity level.  The 10-year ASCVD risk score (Arnett DK, et al., 2019) is: 5.7%   Values used to calculate the score:     Age: 57 years     Sex: Female     Is Non-Hispanic African American: No     Diabetic: Yes     Tobacco smoker: No     Systolic Blood Pressure: 125 mmHg     Is BP treated: Yes     HDL Cholesterol: 45 mg/dL     Total Cholesterol: 193 mg/dL

## 2023-03-06 LAB — COLOGUARD: COLOGUARD: NEGATIVE

## 2023-03-07 NOTE — Progress Notes (Signed)
Great news! Your Cologuard test is negative.  Recommend repeat colon cancer screening in 3 years.

## 2023-04-10 ENCOUNTER — Other Ambulatory Visit: Payer: Self-pay | Admitting: Family Medicine

## 2023-04-10 DIAGNOSIS — E118 Type 2 diabetes mellitus with unspecified complications: Secondary | ICD-10-CM

## 2023-04-23 ENCOUNTER — Other Ambulatory Visit: Payer: Self-pay | Admitting: Family Medicine

## 2023-04-23 DIAGNOSIS — E118 Type 2 diabetes mellitus with unspecified complications: Secondary | ICD-10-CM

## 2023-05-05 ENCOUNTER — Encounter: Payer: Self-pay | Admitting: Family Medicine

## 2023-05-05 ENCOUNTER — Ambulatory Visit: Payer: Managed Care, Other (non HMO) | Admitting: Family Medicine

## 2023-05-05 VITALS — BP 129/63 | HR 78 | Ht 64.0 in | Wt 248.0 lb

## 2023-05-05 DIAGNOSIS — E118 Type 2 diabetes mellitus with unspecified complications: Secondary | ICD-10-CM

## 2023-05-05 DIAGNOSIS — I10 Essential (primary) hypertension: Secondary | ICD-10-CM

## 2023-05-05 DIAGNOSIS — C519 Malignant neoplasm of vulva, unspecified: Secondary | ICD-10-CM

## 2023-05-05 DIAGNOSIS — H04123 Dry eye syndrome of bilateral lacrimal glands: Secondary | ICD-10-CM | POA: Diagnosis not present

## 2023-05-05 DIAGNOSIS — Z7984 Long term (current) use of oral hypoglycemic drugs: Secondary | ICD-10-CM

## 2023-05-05 LAB — POCT GLYCOSYLATED HEMOGLOBIN (HGB A1C): Hemoglobin A1C: 7.1 % — AB (ref 4.0–5.6)

## 2023-05-05 MED ORDER — TRESIBA FLEXTOUCH 100 UNIT/ML ~~LOC~~ SOPN: 10.0000 [IU] | PEN_INJECTOR | Freq: Every day | SUBCUTANEOUS | 3 refills | Status: DC

## 2023-05-05 MED ORDER — TIRZEPATIDE 15 MG/0.5ML ~~LOC~~ SOAJ: 15.0000 mg | SUBCUTANEOUS | 1 refills | Status: DC

## 2023-05-05 NOTE — Assessment & Plan Note (Signed)
Well controlled. Continue current regimen. Follow up in  6 mo  

## 2023-05-05 NOTE — Progress Notes (Signed)
Established Patient Office Visit  Subjective  Patient ID: Laurie Hoffman, female    DOB: 1965/10/20  Age: 57 y.o. MRN: [id number]  Chief Complaint  Patient presents with   Diabetes    HPI  Diabetes - no hypoglycemic events. No wounds or sores that are not healing well. No increased thirst or urination. Checking glucose at home. Taking medications as prescribed without any side effects.Tolerating the [facility] well. No nausea opr constipation  down to 12 units on tresiba but says she forget to take it a few nights.   Hypertension- Pt denies chest pain, SOB, dizziness, or heart palpitations.  Taking meds as directed w/o problems.  Denies medication side effects.            ROS    Objective:     BP 129/63   Pulse 78   Ht 5\' 4"  (1.626 m)   Wt 248 lb (112.5 kg)   SpO2 99%   BMI 42.57 kg/m     Physical Exam Vitals and nursing note reviewed.  Constitutional:      Appearance: Normal appearance.  HENT:     Head: Normocephalic and atraumatic.  Eyes:     Conjunctiva/sclera: Conjunctivae normal.  Cardiovascular:     Rate and Rhythm: Normal rate and regular rhythm.  Pulmonary:     Effort: Pulmonary effort is normal.     Breath sounds: Normal breath sounds.  Skin:    General: Skin is warm and dry.  Neurological:     Mental Status: She is alert.  Psychiatric:        Mood and Affect: Mood normal.      Results for orders placed or performed in visit on 05/05/23  POCT HgB A1C  Result Value Ref Range   Hemoglobin A1C 7.1 (A) 4.0 - 5.6 %   HbA1c POC (<> result, manual entry)     HbA1c, POC (prediabetic range)     HbA1c, POC (controlled diabetic range)         The 10-year ASCVD risk score (Arnett DK, et al., 2019) is: 6%    Assessment & Plan:   Problem List Items Addressed This Visit       Cardiovascular and Mediastinum   Essential hypertension, benign   Well controlled. Continue current regimen. Follow up in  18mo         Endocrine   Controlled diabetes  mellitus type 2 with complications (HCC) - Primary   Up from last time. Work on Therapist, music. Doing better with exercise. Set goal for exercise.        Relevant Medications   tirzepatide (MOUNJARO) 15 MG/0.5ML Pen   insulin degludec (TRESIBA FLEXTOUCH) 100 UNIT/ML FlexTouch Pen   Other Relevant Orders   POCT HgB A1C (Completed)     Genitourinary   Vulvar cancer (HCC)   Encouraged her to get back in for her pap and continue to work on weight loss and healthy diet to reduce cancer risk        Other   Severe obesity (BMI >= 40) (HCC)   Visit #: Starting Weight:  Current weight: 246 lbs  Previous weight: 262 lbs  Change in weight:down 16 lbs  Goal weight:  Dietary goals: Exercise goals: doing walking and 20 min exercise video 2 days per week, sometime 3.  Medication: Inc mounjaro to 15 Follow-up and referrals: 3 mo        Relevant Medications   tirzepatide (MOUNJARO) 15 MG/0.5ML Pen   insulin degludec (TRESIBA FLEXTOUCH)  100 UNIT/ML FlexTouch Pen   Dry eye syndrome of both eyes   Taking fish oil. No longer on drops      Encouraged her to schedule her pap ASAP with gyn down the hall.   Return in about 3 months (around 08/03/2023) for Diabetes follow-up.    Nani Gasser, MD

## 2023-05-05 NOTE — Assessment & Plan Note (Signed)
Taking fish oil. No longer on drops

## 2023-05-05 NOTE — Assessment & Plan Note (Signed)
Encouraged her to get back in for her pap and continue to work on weight loss and healthy diet to reduce cancer risk

## 2023-05-05 NOTE — Assessment & Plan Note (Signed)
Visit #: Starting Weight:  Current weight: 246 lbs  Previous weight: 262 lbs  Change in weight:down 16 lbs  Goal weight:  Dietary goals: Exercise goals: doing walking and 20 min exercise video 2 days per week, sometime 3.  Medication: Inc mounjaro to 15 Follow-up and referrals: 3 mo

## 2023-05-05 NOTE — Assessment & Plan Note (Signed)
Up from last time. Work on Therapist, music. Doing better with exercise. Set goal for exercise.

## 2023-07-08 ENCOUNTER — Other Ambulatory Visit: Payer: Self-pay | Admitting: Family Medicine

## 2023-07-08 DIAGNOSIS — I10 Essential (primary) hypertension: Secondary | ICD-10-CM

## 2023-08-03 ENCOUNTER — Ambulatory Visit: Payer: Managed Care, Other (non HMO) | Admitting: Family Medicine

## 2023-08-03 ENCOUNTER — Encounter: Payer: Self-pay | Admitting: Family Medicine

## 2023-08-03 VITALS — BP 120/74 | HR 82 | Ht 64.0 in | Wt 238.0 lb

## 2023-08-03 DIAGNOSIS — T466X5A Adverse effect of antihyperlipidemic and antiarteriosclerotic drugs, initial encounter: Secondary | ICD-10-CM

## 2023-08-03 DIAGNOSIS — T50905A Adverse effect of unspecified drugs, medicaments and biological substances, initial encounter: Secondary | ICD-10-CM

## 2023-08-03 DIAGNOSIS — E1169 Type 2 diabetes mellitus with other specified complication: Secondary | ICD-10-CM

## 2023-08-03 DIAGNOSIS — I10 Essential (primary) hypertension: Secondary | ICD-10-CM | POA: Diagnosis not present

## 2023-08-03 DIAGNOSIS — G720 Drug-induced myopathy: Secondary | ICD-10-CM | POA: Diagnosis not present

## 2023-08-03 DIAGNOSIS — E118 Type 2 diabetes mellitus with unspecified complications: Secondary | ICD-10-CM | POA: Diagnosis not present

## 2023-08-03 DIAGNOSIS — E785 Hyperlipidemia, unspecified: Secondary | ICD-10-CM

## 2023-08-03 LAB — POCT GLYCOSYLATED HEMOGLOBIN (HGB A1C): Hemoglobin A1C: 4.9 % (ref 4.0–5.6)

## 2023-08-03 MED ORDER — DAPAGLIFLOZIN PRO-METFORMIN ER 5-1000 MG PO TB24: 1.0000 | ORAL_TABLET | Freq: Every day | ORAL | 1 refills | Status: DC

## 2023-08-03 MED ORDER — PITAVASTATIN CALCIUM 1 MG PO TABS: 1.0000 mg | ORAL_TABLET | Freq: Every day | ORAL | 3 refills | Status: AC

## 2023-08-03 NOTE — Assessment & Plan Note (Signed)
 Well controlled. Continue current regimen. Follow up in  4 mo

## 2023-08-03 NOTE — Assessment & Plan Note (Addendum)
 She is doing incredibly well.  She is lost more than 60 pounds in her journey to get healthy and lose weight and improve her diabetes.  In fact her A1c is all the way down to 4.9 today so it is time to start decreasing her medications.  She does not think she has had any hypoglycemic events that were significant.  Stop insulin. Dec Xigduo to once a day.  F/U in 4 months keep up great work on diet and exercise.

## 2023-08-03 NOTE — Progress Notes (Signed)
 Established Patient Office Visit  Subjective  Patient ID: Laurie Hoffman, female    DOB: [birth year]  Age: 58 y.o. MRN: [id number]  Chief Complaint  Patient presents with   Diabetes   Hypertension    HPI DM f/u - doing well on mounjaro. Still giving 5-8 units of long acting insulin.  Waiting on new glucometer. Can't find old one.   She has been doing a walk/fit program to inc her activity level and has started gardening for the spring.     ROS    Objective:     BP 120/74   Pulse 82   Ht 5\' 4"  (1.626 m)   Wt 238 lb (108 kg)   SpO2 100%   BMI 40.85 kg/m    Physical Exam Vitals and nursing note reviewed.  Constitutional:      Appearance: Normal appearance.  HENT:     Head: Normocephalic and atraumatic.  Eyes:     Conjunctiva/sclera: Conjunctivae normal.  Cardiovascular:     Rate and Rhythm: Normal rate and regular rhythm.  Pulmonary:     Effort: Pulmonary effort is normal.     Breath sounds: Normal breath sounds.  Skin:    General: Skin is warm and dry.  Neurological:     Mental Status: She is alert.  Psychiatric:        Mood and Affect: Mood normal.      Results for orders placed or performed in visit on 08/03/23  POCT HgB A1C  Result Value Ref Range   Hemoglobin A1C 4.9 4.0 - 5.6 %   HbA1c POC (<> result, manual entry)     HbA1c, POC (prediabetic range)     HbA1c, POC (controlled diabetic range)        The 10-year ASCVD risk score (Arnett DK, et al., 2019) is: 5.2%    Assessment & Plan:   Problem List Items Addressed This Visit       Cardiovascular and Mediastinum   Essential hypertension, benign - Primary   Well controlled. Continue current regimen. Follow up in  4 mo      Relevant Medications   Pitavastatin Calcium 1 MG TABS   Other Relevant Orders   CMP14+EGFR     Endocrine   Hyperlipidemia associated with type 2 diabetes mellitus (HCC)   Relevant Medications   Pitavastatin Calcium 1 MG TABS   Dapagliflozin Pro-metFORMIN ER  (XIGDUO XR) 09-998 MG TB24   Controlled diabetes mellitus type 2 with complications (HCC)   She is doing incredibly well.  She is lost more than 60 pounds in her journey to get healthy and lose weight and improve her diabetes.  In fact her A1c is all the way down to 4.9 today so it is time to start decreasing her medications.  She does not think she has had any hypoglycemic events that were significant.  Stop insulin. Dec Xigduo to once a day.  F/U in 4 months keep up great work on diet and exercise.       Relevant Medications   Pitavastatin Calcium 1 MG TABS   Dapagliflozin Pro-metFORMIN ER (XIGDUO XR) 09-998 MG TB24   Other Relevant Orders   POCT HgB A1C (Completed)   CMP14+EGFR     Musculoskeletal and Integument   Statin myopathy   Relevant Medications   Pitavastatin Calcium 1 MG TABS   Other Visit Diagnoses       Medication side effect, initial encounter       Relevant Medications   Pitavastatin  Calcium 1 MG TABS       Return in about 4 months (around 12/03/2023) for Diabetes follow-up.    Nani Gasser, MD

## 2023-08-04 ENCOUNTER — Encounter: Payer: Self-pay | Admitting: Family Medicine

## 2023-08-04 LAB — CMP14+EGFR
ALT: 17 IU/L (ref 0–32)
AST: 15 IU/L (ref 0–40)
Albumin: 4.4 g/dL (ref 3.8–4.9)
Alkaline Phosphatase: 95 IU/L (ref 44–121)
BUN/Creatinine Ratio: 27 — ABNORMAL HIGH (ref 9–23)
BUN: 25 mg/dL — ABNORMAL HIGH (ref 6–24)
Bilirubin Total: 0.5 mg/dL (ref 0.0–1.2)
CO2: 24 mmol/L (ref 20–29)
Calcium: 10.1 mg/dL (ref 8.7–10.2)
Chloride: 97 mmol/L (ref 96–106)
Creatinine, Ser: 0.92 mg/dL (ref 0.57–1.00)
Globulin, Total: 2.9 g/dL (ref 1.5–4.5)
Glucose: 118 mg/dL — ABNORMAL HIGH (ref 70–99)
Potassium: 4.8 mmol/L (ref 3.5–5.2)
Sodium: 139 mmol/L (ref 134–144)
Total Protein: 7.3 g/dL (ref 6.0–8.5)
eGFR: 74 mL/min/{1.73_m2} (ref >59)

## 2023-08-04 NOTE — Progress Notes (Signed)
 Hi Beth, kidney function is stable.  Liver function is normal as well.

## 2023-10-03 ENCOUNTER — Ambulatory Visit: Admitting: Obstetrics & Gynecology

## 2023-10-03 ENCOUNTER — Other Ambulatory Visit (HOSPITAL_COMMUNITY)
Admission: RE | Admit: 2023-10-03 | Discharge: 2023-10-03 | Disposition: A | Discharge status: Home / Self Care | Source: Ambulatory Visit | Attending: Obstetrics & Gynecology | Admitting: Obstetrics & Gynecology

## 2023-10-03 ENCOUNTER — Encounter: Payer: Self-pay | Admitting: Obstetrics & Gynecology

## 2023-10-03 VITALS — BP 137/84 | HR 94 | Ht 64.0 in | Wt 231.0 lb

## 2023-10-03 DIAGNOSIS — Z13820 Encounter for screening for osteoporosis: Secondary | ICD-10-CM | POA: Diagnosis not present

## 2023-10-03 DIAGNOSIS — Z1331 Encounter for screening for depression: Secondary | ICD-10-CM | POA: Diagnosis not present

## 2023-10-03 DIAGNOSIS — Z01419 Encounter for gynecological examination (general) (routine) without abnormal findings: Secondary | ICD-10-CM | POA: Diagnosis present

## 2023-10-03 NOTE — Progress Notes (Signed)
  Subjective:     Laurie Hoffman is a 58 y.o. female here for a routine exam.  Current complaints: was seeing Gyn Onc for vulvar exams--vulvar cancer.  Released to PCP a few years ago.  Does self vulvar exams.  No changes. .     Gynecologic History No LMP recorded. Patient is postmenopausal. Contraception: post menopausal status Last pap smear (date and result):03/01/2018 WNL Last mammogram (date and result):09/29/2022 WNL Last colon screening (date and result):Cologuard 2024 Negative Brush:yes Floss:yes Seatbelts: yes Sunscreen: yes   Obstetric History OB History  Gravida Para Term Preterm AB Living  0 0 0 0 0 0  SAB IAB Ectopic Multiple Live Births  0 0 0 0 0     The following portions of the patient's history were reviewed and updated as appropriate: allergies, current medications, past family history, past medical history, past social history, past surgical history, and problem list.  Review of Systems Pertinent items noted in HPI and remainder of comprehensive ROS otherwise negative.    Objective:     Vitals:   10/03/23 0901  BP: 137/84  Pulse: 94  Weight: 231 lb (104.8 kg)  Height: 5\' 4"  (1.626 m)   Vitals:  WNL General appearance: alert, cooperative and no distress  HEENT: Normocephalic, without obvious abnormality, atraumatic Eyes: negative Throat: lips, mucosa, and tongue normal; teeth and gums normal  Respiratory: Clear to auscultation bilaterally  CV: Regular rate and rhythm  Breasts:  Normal appearance, no masses or tenderness, no nipple retraction or dimpling  GI: Soft, non-tender; bowel sounds normal; no masses,  no organomegaly  GU: External Genitalia:  Tanner V, no lesion; scar tissue noted.  No recurrence of vulvar cancer seen Urethra:  No prolapse   Vagina: Atrophic, pale pink, normal rugae, no blood or discharge  Cervix: No CMT, no lesion; cervix is small an anterior and shallow--use metal pedersons  Uterus:  Normal size and contour, non tender,  limited by habitus  Adnexa: Normal, no masses, non tender, limited by habitus  Musculoskeletal: No edema, redness or tenderness in the calves or thighs  Skin: No lesions or rash  Lymphatic: Axillary adenopathy: none     Psychiatric: Normal mood and behavior        Assessment:    Healthy female exam.  Hx of vulvar cancer   Plan:    Pap with co testing Dexa--20 years in menopause; calcium  1200 mg daily and vit D No PMB Yearly mammograms UP to date with Cologard

## 2023-10-04 ENCOUNTER — Other Ambulatory Visit: Payer: Self-pay | Admitting: Family Medicine

## 2023-10-04 DIAGNOSIS — Z1231 Encounter for screening mammogram for malignant neoplasm of breast: Secondary | ICD-10-CM

## 2023-10-04 LAB — CYTOLOGY - PAP
Comment: NEGATIVE
Diagnosis: NEGATIVE
High risk HPV: NEGATIVE

## 2023-10-05 ENCOUNTER — Ambulatory Visit: Payer: Self-pay | Admitting: Obstetrics & Gynecology

## 2023-10-05 ENCOUNTER — Other Ambulatory Visit: Payer: Self-pay | Admitting: Family Medicine

## 2023-10-05 DIAGNOSIS — J3089 Other allergic rhinitis: Secondary | ICD-10-CM

## 2023-10-12 ENCOUNTER — Ambulatory Visit (INDEPENDENT_AMBULATORY_CARE_PROVIDER_SITE_OTHER)

## 2023-10-12 ENCOUNTER — Ambulatory Visit

## 2023-10-12 DIAGNOSIS — Z1231 Encounter for screening mammogram for malignant neoplasm of breast: Secondary | ICD-10-CM | POA: Diagnosis not present

## 2023-10-12 DIAGNOSIS — Z13820 Encounter for screening for osteoporosis: Secondary | ICD-10-CM

## 2023-10-17 ENCOUNTER — Ambulatory Visit: Payer: Self-pay | Admitting: Family Medicine

## 2023-10-17 NOTE — Progress Notes (Signed)
 Please call patient. Normal mammogram.  Repeat in 1 year.

## 2023-10-20 ENCOUNTER — Other Ambulatory Visit: Payer: Self-pay | Admitting: Family Medicine

## 2023-10-20 DIAGNOSIS — E118 Type 2 diabetes mellitus with unspecified complications: Secondary | ICD-10-CM

## 2023-12-01 ENCOUNTER — Ambulatory Visit: Admitting: Family Medicine

## 2023-12-01 VITALS — BP 132/60 | HR 77 | Ht 64.0 in | Wt 230.0 lb

## 2023-12-01 DIAGNOSIS — E118 Type 2 diabetes mellitus with unspecified complications: Secondary | ICD-10-CM | POA: Diagnosis not present

## 2023-12-01 DIAGNOSIS — Z7985 Long-term (current) use of injectable non-insulin antidiabetic drugs: Secondary | ICD-10-CM | POA: Diagnosis not present

## 2023-12-01 DIAGNOSIS — I10 Essential (primary) hypertension: Secondary | ICD-10-CM

## 2023-12-01 LAB — POCT GLYCOSYLATED HEMOGLOBIN (HGB A1C): Hemoglobin A1C: 7.5 % — AB (ref 4.0–5.6)

## 2023-12-01 MED ORDER — DAPAGLIFLOZIN PRO-METFORMIN ER 5-1000 MG PO TB24: 2.0000 | ORAL_TABLET | Freq: Every day | ORAL | 1 refills | Status: DC

## 2023-12-01 NOTE — Progress Notes (Signed)
 Established Patient Office Visit  Subjective  Patient ID: Laurie Hoffman, female    DOB: 1965-05-28  Age: 58 y.o. MRN: [id number]  Chief Complaint  Patient presents with   Diabetes   Hypertension    HPI   Diabetes - no hypoglycemic events. No wounds or sores that are not healing well. No increased thirst or urination. Checking glucose at home. Taking medications as prescribed without any side effects.  When I saw her last time we stopped her insulin  and also decreased her Xigduo  down to 1 tab daily we continued with the tirzepatide  at the time her A1c was 4.9 she feels like overall she is doing well she has been trying to stay active and do some light weights and trying to get her legs moving more consistently.  Eye exam up-to-date from June.  Form to [facility].  Hypertension- Pt denies chest pain, SOB, dizziness, or heart palpitations.  Taking meds as directed w/o problems.  Denies medication side effects.      ROS    Objective:     BP 132/60   Pulse 77   Ht 5' 4 (1.626 m)   Wt 230 lb (104.3 kg)   SpO2 99%   BMI 39.48 kg/m    Physical Exam Vitals and nursing note reviewed.  Constitutional:      Appearance: Normal appearance.  HENT:     Head: Normocephalic and atraumatic.  Eyes:     Conjunctiva/sclera: Conjunctivae normal.  Cardiovascular:     Rate and Rhythm: Normal rate and regular rhythm.  Pulmonary:     Effort: Pulmonary effort is normal.     Breath sounds: Normal breath sounds.  Skin:    General: Skin is warm and dry.  Neurological:     Mental Status: She is alert.  Psychiatric:        Mood and Affect: Mood normal.      Results for orders placed or performed in visit on 12/01/23  POCT HgB A1C  Result Value Ref Range   Hemoglobin A1C 7.5 (A) 4.0 - 5.6 %   HbA1c POC (<> result, manual entry)     HbA1c, POC (prediabetic range)     HbA1c, POC (controlled diabetic range)        The 10-year ASCVD risk score (Arnett DK, et al., 2019) is:  6.9%    Assessment & Plan:   Problem List Items Addressed This Visit       Cardiovascular and Mediastinum   Essential hypertension, benign - Primary   Well controlled. Continue current regimen. Follow up in  4 mo         Endocrine   Controlled diabetes mellitus type 2 with complications (HCC)   A1C of 7.5 today, uncontrolled.  I am going to increase the Xigduo  back up to 2 tabs daily but I am still going to hold off on the insulin .  Just encouraged her to continue to work on portion control decrease carbs and increased protein intake.  Continue with Mounjaro  15 mg she says she has not missed any doses.  Follow-up in 3 months.       Relevant Medications   Dapagliflozin  Pro-metFORMIN  ER (XIGDUO  XR) 09-998 MG TB24   Other Relevant Orders   POCT HgB A1C (Completed)     Other   Severe obesity (BMI >= 40) (HCC)   He is really doing well with weight loss down to 230 pounds.  Continue with the Mounjaro  continue to work on portion control and increasing  activity levels.      Relevant Medications   Dapagliflozin  Pro-metFORMIN  ER (XIGDUO  XR) 09-998 MG TB24    Return in about 3 months (around 03/02/2024) for Diabetes follow-up.    Dorothyann Byars, MD

## 2023-12-01 NOTE — Assessment & Plan Note (Signed)
 Well controlled. Continue current regimen. Follow up in  4 mo

## 2023-12-01 NOTE — Patient Instructions (Signed)
 Increase Xigduo  to 2 tabs daily

## 2023-12-01 NOTE — Assessment & Plan Note (Addendum)
 A1C of 7.5 today, uncontrolled.  I am going to increase the Xigduo  back up to 2 tabs daily but I am still going to hold off on the insulin .  Just encouraged her to continue to work on portion control decrease carbs and increased protein intake.  Continue with Mounjaro  15 mg she says she has not missed any doses.  Follow-up in 3 months.

## 2023-12-01 NOTE — Assessment & Plan Note (Signed)
 He is really doing well with weight loss down to 230 pounds.  Continue with the Mounjaro  continue to work on portion control and increasing activity levels.

## 2023-12-22 ENCOUNTER — Encounter: Payer: Self-pay | Admitting: Obstetrics & Gynecology

## 2023-12-22 DIAGNOSIS — M8580 Other specified disorders of bone density and structure, unspecified site: Secondary | ICD-10-CM | POA: Insufficient documentation

## 2024-02-29 ENCOUNTER — Ambulatory Visit: Admitting: Family Medicine

## 2024-02-29 ENCOUNTER — Encounter: Payer: Self-pay | Admitting: Family Medicine

## 2024-02-29 VITALS — BP 113/60 | HR 86 | Ht 64.0 in | Wt 222.0 lb

## 2024-02-29 DIAGNOSIS — Z7985 Long-term (current) use of injectable non-insulin antidiabetic drugs: Secondary | ICD-10-CM

## 2024-02-29 DIAGNOSIS — Z7984 Long term (current) use of oral hypoglycemic drugs: Secondary | ICD-10-CM

## 2024-02-29 DIAGNOSIS — E118 Type 2 diabetes mellitus with unspecified complications: Secondary | ICD-10-CM | POA: Diagnosis not present

## 2024-02-29 DIAGNOSIS — I10 Essential (primary) hypertension: Secondary | ICD-10-CM | POA: Diagnosis not present

## 2024-02-29 DIAGNOSIS — E785 Hyperlipidemia, unspecified: Secondary | ICD-10-CM | POA: Diagnosis not present

## 2024-02-29 DIAGNOSIS — E1169 Type 2 diabetes mellitus with other specified complication: Secondary | ICD-10-CM

## 2024-02-29 LAB — POCT GLYCOSYLATED HEMOGLOBIN (HGB A1C): Hemoglobin A1C: 7.4 % — AB (ref 4.0–5.6)

## 2024-02-29 MED ORDER — DAPAGLIFLOZIN PRO-METFORMIN ER 10-1000 MG PO TB24: 1.0000 | ORAL_TABLET | Freq: Every day | ORAL | 3 refills | Status: AC

## 2024-02-29 NOTE — Assessment & Plan Note (Signed)
 BMI 35 with comobidity of DM.   She is doing gret. Now down to 222 lbs.

## 2024-02-29 NOTE — Progress Notes (Signed)
 Established Patient Office Visit  Subjective  Patient ID: Laurie Hoffman, female    DOB: [birth year]  Age: 58 y.o. MRN: [id number]  Chief Complaint  Patient presents with   Diabetes    HPI . Discussed the use of AI scribe software for clinical note transcription with the patient, who gave verbal consent to proceed.  History of Present Illness Laurie Hoffman is a 58 year old female with type 2 diabetes who presents for follow-up of her diabetes management.  Glycemic control and weight management - Type 2 diabetes mellitus managed with Mounjaro  15 mg and Xigduo  09/998 mg. - Last HbA1c was 7.4%. - Current weight is 218 pounds. - Increased energy levels recently. - Actively modifying diet to increase protein intake with meals and snacks (nuts, tofu). - Reduced intake of rice, substituting with brown rice or cauliflower rice. - Eliminated regular pasta, using lower-carbohydrate options such as [employer] pasta. - Occasional consumption of sweets, such as sharing a piece of cheesecake every couple of months.  Physical activity and musculoskeletal symptoms - Increasing physical activity. - Improved energy and decreased knee pain, allowing for more comfortable exercise. - Prefers using weights over resistance bands due to aversion to the smell of rubber bands.  Immunization reactions - Received COVID-19 vaccine at [facility] two weeks ago with subsequent bruising at the injection site. - Received influenza vaccine at [facility].     ROS    Objective:     BP 113/60   Pulse 86   Ht 5' 4 (1.626 m)   Wt 222 lb 0.6 oz (100.7 kg)   SpO2 99%   BMI 38.11 kg/m    Physical Exam Vitals and nursing note reviewed.  Constitutional:      Appearance: Normal appearance.  HENT:     Head: Normocephalic and atraumatic.  Eyes:     Conjunctiva/sclera: Conjunctivae normal.  Cardiovascular:     Rate and Rhythm: Normal rate and regular rhythm.  Pulmonary:     Effort: Pulmonary effort is  normal.     Breath sounds: Normal breath sounds.  Skin:    General: Skin is warm and dry.  Neurological:     Mental Status: She is alert.  Psychiatric:        Mood and Affect: Mood normal.      Results for orders placed or performed in visit on 02/29/24  POCT HgB A1C  Result Value Ref Range   Hemoglobin A1C 7.4 (A) 4.0 - 5.6 %   HbA1c POC (<> result, manual entry)     HbA1c, POC (prediabetic range)     HbA1c, POC (controlled diabetic range)        The 10-year ASCVD risk score (Arnett DK, et al., 2019) is: 5.1%    Assessment & Plan:   Problem List Items Addressed This Visit       Cardiovascular and Mediastinum   Essential hypertension, benign - Primary   Relevant Orders   POCT HgB A1C (Completed)   Lipid Panel With LDL/HDL Ratio   CBC   Urine Microalbumin w/creat. ratio   CMP14+EGFR     Endocrine   Hyperlipidemia associated with type 2 diabetes mellitus (HCC)   Relevant Medications   Dapagliflozin  Pro-metFORMIN  ER (XIGDUO  XR) 02-999 MG TB24   Other Relevant Orders   POCT HgB A1C (Completed)   Lipid Panel With LDL/HDL Ratio   CBC   Urine Microalbumin w/creat. ratio   CMP14+EGFR   Controlled diabetes mellitus type 2 with complications (HCC)   Relevant  Medications   Dapagliflozin  Pro-metFORMIN  ER (XIGDUO  XR) 02-999 MG TB24   Other Relevant Orders   POCT HgB A1C (Completed)   Lipid Panel With LDL/HDL Ratio   CBC   Urine Microalbumin w/creat. ratio   CMP14+EGFR     Other   MORBID OBESITY   BMI 35 with comobidity of DM.   She is doing gret. Now down to 222 lbs.        Relevant Medications   Dapagliflozin  Pro-metFORMIN  ER (XIGDUO  XR) 02-999 MG TB24    Assessment and Plan Assessment & Plan Type 2 diabetes mellitus with complications HbA1c at 7.4%. Goal to reduce HbA1c to 6% range. Current medications include Mounjaro  15 mg and Xigduo  XR 09-998 mg daily. Weight loss improving insulin  sensitivity. Increased protein intake stabilizing blood sugars. -  Increase Xigduo  XR dosage to improve glycemic control. - Encourage continued weight loss and dietary modifications, including increased protein intake. - Encourage regular exercise, including cardio and weight training. - Order blood work today for further evaluation.  Essential hypertension Managed with dietary modifications. - Continue current dietary modifications to manage blood pressure.  Severe obesity (BMI > 35 with comobidity of DM) Recent weight loss noted. Increased energy levels with dietary changes and physical activity. - Encourage continued weight loss through dietary changes and increased physical activity.  General Health Maintenance Received COVID-19 vaccine and flu shot. Due for blood work today. Eye exam completed in July. - Ensure blood work is completed today.    Return in about 3 months (around 06/14/2024).    Dorothyann Byars, MD

## 2024-03-01 ENCOUNTER — Encounter: Payer: Self-pay | Admitting: Family Medicine

## 2024-03-01 ENCOUNTER — Ambulatory Visit: Payer: Self-pay | Admitting: Family Medicine

## 2024-03-01 LAB — CBC
Hematocrit: 46.2 % (ref 34.0–46.6)
Hemoglobin: 15.4 g/dL (ref 11.1–15.9)
MCH: 30.7 pg (ref 26.6–33.0)
MCHC: 33.3 g/dL (ref 31.5–35.7)
MCV: 92 fL (ref 79–97)
Platelets: 337 x10E3/uL (ref 150–450)
RBC: 5.02 x10E6/uL (ref 3.77–5.28)
RDW: 12.4 % (ref 11.7–15.4)
WBC: 6 x10E3/uL (ref 3.4–10.8)

## 2024-03-01 LAB — CMP14+EGFR
ALT: 18 IU/L (ref 0–32)
AST: 19 IU/L (ref 0–40)
Albumin: 4.2 g/dL (ref 3.8–4.9)
Alkaline Phosphatase: 87 IU/L (ref 49–135)
BUN/Creatinine Ratio: 25 — ABNORMAL HIGH (ref 9–23)
BUN: 22 mg/dL (ref 6–24)
Bilirubin Total: 0.5 mg/dL (ref 0.0–1.2)
CO2: 20 mmol/L (ref 20–29)
Calcium: 9.5 mg/dL (ref 8.7–10.2)
Chloride: 96 mmol/L (ref 96–106)
Creatinine, Ser: 0.87 mg/dL (ref 0.57–1.00)
Globulin, Total: 2.9 g/dL (ref 1.5–4.5)
Glucose: 124 mg/dL — ABNORMAL HIGH (ref 70–99)
Potassium: 4.6 mmol/L (ref 3.5–5.2)
Sodium: 135 mmol/L (ref 134–144)
Total Protein: 7.1 g/dL (ref 6.0–8.5)
eGFR: 78 mL/min/1.73 (ref >59)

## 2024-03-01 LAB — LIPID PANEL WITH LDL/HDL RATIO
Cholesterol, Total: 192 mg/dL (ref 100–199)
HDL: 39 mg/dL — ABNORMAL LOW (ref >39)
LDL Chol Calc (NIH): 130 mg/dL — ABNORMAL HIGH (ref 0–99)
LDL/HDL Ratio: 3.3 ratio — ABNORMAL HIGH (ref 0.0–3.2)
Triglycerides: 127 mg/dL (ref 0–149)
VLDL Cholesterol Cal: 23 mg/dL (ref 5–40)

## 2024-03-01 LAB — MICROALBUMIN / CREATININE URINE RATIO
Creatinine, Urine: 48.1 mg/dL
Microalb/Creat Ratio: <6 mg/g{creat} (ref 0–29)
Microalbumin, Urine: <3 ug/mL

## 2024-03-01 NOTE — Progress Notes (Signed)
 HI Laurie Hoffman,  Your LDL cholesterol is at 130.  Goal is under 100 continue to work on healthy Mediterranean diet.  The kidney function looks fantastic.  Liver functions normal.  Blood counts normal no sign of anemia.

## 2024-03-02 NOTE — Progress Notes (Signed)
 No excess protein in the urine which is good news.  Also please print and mail a copy of all lab results from this visit to her.  Thank you so much.

## 2024-03-30 ENCOUNTER — Other Ambulatory Visit: Payer: Self-pay | Admitting: Family Medicine

## 2024-03-30 DIAGNOSIS — E118 Type 2 diabetes mellitus with unspecified complications: Secondary | ICD-10-CM

## 2024-06-14 ENCOUNTER — Ambulatory Visit: Admitting: Family Medicine

## 2024-06-14 DIAGNOSIS — E118 Type 2 diabetes mellitus with unspecified complications: Secondary | ICD-10-CM

## 2024-07-30 ENCOUNTER — Ambulatory Visit: Admitting: Family Medicine
